# Patient Record
Sex: Female | Born: 1945 | Race: White | Hispanic: No | Marital: Married | Smoking: Never smoker
Health system: Southern US, Community
[De-identification: ages and names within clinical notes are randomized; demographics above are authoritative.]

## PROBLEM LIST (undated history)

## (undated) DIAGNOSIS — F32A Depression, unspecified: Secondary | ICD-10-CM

## (undated) DIAGNOSIS — R1013 Epigastric pain: Secondary | ICD-10-CM

## (undated) DIAGNOSIS — F419 Anxiety disorder, unspecified: Secondary | ICD-10-CM

## (undated) DIAGNOSIS — R45851 Suicidal ideations: Secondary | ICD-10-CM

## (undated) DIAGNOSIS — K589 Irritable bowel syndrome without diarrhea: Secondary | ICD-10-CM

## (undated) DIAGNOSIS — R4189 Other symptoms and signs involving cognitive functions and awareness: Secondary | ICD-10-CM

## (undated) DIAGNOSIS — M5136 Other intervertebral disc degeneration, lumbar region: Secondary | ICD-10-CM

## (undated) DIAGNOSIS — I639 Cerebral infarction, unspecified: Secondary | ICD-10-CM

## (undated) DIAGNOSIS — G2581 Restless legs syndrome: Secondary | ICD-10-CM

## (undated) DIAGNOSIS — I471 Supraventricular tachycardia, unspecified: Secondary | ICD-10-CM

## (undated) DIAGNOSIS — M8580 Other specified disorders of bone density and structure, unspecified site: Secondary | ICD-10-CM

## (undated) DIAGNOSIS — F429 Obsessive-compulsive disorder, unspecified: Secondary | ICD-10-CM

## (undated) DIAGNOSIS — K760 Fatty (change of) liver, not elsewhere classified: Secondary | ICD-10-CM

## (undated) DIAGNOSIS — K5790 Diverticulosis of intestine, part unspecified, without perforation or abscess without bleeding: Secondary | ICD-10-CM

## (undated) DIAGNOSIS — K3189 Other diseases of stomach and duodenum: Secondary | ICD-10-CM

## (undated) DIAGNOSIS — K635 Polyp of colon: Secondary | ICD-10-CM

## (undated) DIAGNOSIS — K219 Gastro-esophageal reflux disease without esophagitis: Secondary | ICD-10-CM

## (undated) DIAGNOSIS — M51369 Other intervertebral disc degeneration, lumbar region without mention of lumbar back pain or lower extremity pain: Secondary | ICD-10-CM

## (undated) DIAGNOSIS — I493 Ventricular premature depolarization: Secondary | ICD-10-CM

## (undated) DIAGNOSIS — N281 Cyst of kidney, acquired: Secondary | ICD-10-CM

## (undated) DIAGNOSIS — Z8619 Personal history of other infectious and parasitic diseases: Secondary | ICD-10-CM

## (undated) DIAGNOSIS — F329 Major depressive disorder, single episode, unspecified: Secondary | ICD-10-CM

## (undated) DIAGNOSIS — I10 Essential (primary) hypertension: Secondary | ICD-10-CM

## (undated) DIAGNOSIS — Z9889 Other specified postprocedural states: Secondary | ICD-10-CM

## (undated) DIAGNOSIS — M1990 Unspecified osteoarthritis, unspecified site: Secondary | ICD-10-CM

## (undated) DIAGNOSIS — R110 Nausea: Secondary | ICD-10-CM

## (undated) DIAGNOSIS — R7303 Prediabetes: Secondary | ICD-10-CM

## (undated) HISTORY — PX: OOPHORECTOMY: SHX86

## (undated) HISTORY — PX: LAPAROSCOPIC BILATERAL SALPINGO OOPHERECTOMY: SHX5890

## (undated) HISTORY — PX: COLONOSCOPY WITH ESOPHAGOGASTRODUODENOSCOPY (EGD): SHX5779

## (undated) HISTORY — PX: KNEE ARTHROSCOPY: SUR90

## (undated) HISTORY — PX: OTHER SURGICAL HISTORY: SHX169

## (undated) HISTORY — PX: FOOT SURGERY: SHX648

## (undated) HISTORY — PX: ABDOMINAL HYSTERECTOMY: SHX81

---

## 2000-06-04 HISTORY — PX: BREAST SURGERY: SHX581

## 2000-06-04 HISTORY — PX: BREAST EXCISIONAL BIOPSY: SUR124

## 2003-11-26 ENCOUNTER — Other Ambulatory Visit: Payer: Self-pay

## 2004-02-01 ENCOUNTER — Other Ambulatory Visit: Payer: Self-pay

## 2004-03-06 ENCOUNTER — Ambulatory Visit: Payer: Self-pay | Admitting: Anesthesiology

## 2004-07-26 ENCOUNTER — Ambulatory Visit: Payer: Self-pay | Admitting: Unknown Physician Specialty

## 2004-08-01 ENCOUNTER — Ambulatory Visit: Payer: Self-pay | Admitting: Unknown Physician Specialty

## 2004-09-19 ENCOUNTER — Emergency Department: Payer: Self-pay | Admitting: Emergency Medicine

## 2005-02-12 ENCOUNTER — Ambulatory Visit: Payer: Self-pay | Admitting: Obstetrics and Gynecology

## 2005-02-19 ENCOUNTER — Ambulatory Visit: Payer: Self-pay | Admitting: Obstetrics and Gynecology

## 2005-03-07 ENCOUNTER — Ambulatory Visit: Payer: Self-pay | Admitting: Unknown Physician Specialty

## 2005-04-08 ENCOUNTER — Emergency Department: Payer: Self-pay | Admitting: General Practice

## 2005-06-01 ENCOUNTER — Ambulatory Visit: Payer: Self-pay | Admitting: Podiatry

## 2005-11-02 ENCOUNTER — Other Ambulatory Visit: Payer: Self-pay

## 2005-11-02 ENCOUNTER — Ambulatory Visit: Payer: Self-pay | Admitting: Unknown Physician Specialty

## 2005-11-07 ENCOUNTER — Ambulatory Visit: Payer: Self-pay | Admitting: Unknown Physician Specialty

## 2006-02-14 ENCOUNTER — Ambulatory Visit: Payer: Self-pay | Admitting: Obstetrics and Gynecology

## 2006-03-04 ENCOUNTER — Ambulatory Visit: Payer: Self-pay | Admitting: Podiatry

## 2006-06-04 HISTORY — PX: KNEE ARTHROSCOPY: SUR90

## 2006-08-30 ENCOUNTER — Emergency Department: Payer: Self-pay | Admitting: Unknown Physician Specialty

## 2006-09-02 ENCOUNTER — Inpatient Hospital Stay: Payer: Self-pay | Admitting: Unknown Physician Specialty

## 2007-02-10 ENCOUNTER — Ambulatory Visit: Payer: Self-pay | Admitting: Unknown Physician Specialty

## 2007-02-18 ENCOUNTER — Ambulatory Visit: Payer: Self-pay | Admitting: Obstetrics and Gynecology

## 2008-01-19 ENCOUNTER — Inpatient Hospital Stay: Payer: Self-pay | Admitting: Psychiatry

## 2008-02-20 ENCOUNTER — Ambulatory Visit: Payer: Self-pay | Admitting: Obstetrics and Gynecology

## 2008-05-05 ENCOUNTER — Ambulatory Visit: Payer: Self-pay | Admitting: Anesthesiology

## 2008-05-24 ENCOUNTER — Ambulatory Visit: Payer: Self-pay | Admitting: Anesthesiology

## 2008-06-07 ENCOUNTER — Ambulatory Visit: Payer: Self-pay | Admitting: Anesthesiology

## 2008-07-08 ENCOUNTER — Ambulatory Visit: Payer: Self-pay | Admitting: Anesthesiology

## 2009-03-15 ENCOUNTER — Ambulatory Visit: Payer: Self-pay | Admitting: Obstetrics and Gynecology

## 2009-06-04 HISTORY — PX: CHOLECYSTECTOMY: SHX55

## 2009-11-08 ENCOUNTER — Ambulatory Visit: Payer: Self-pay | Admitting: Unknown Physician Specialty

## 2009-11-22 ENCOUNTER — Ambulatory Visit: Payer: Self-pay | Admitting: Surgery

## 2009-11-25 ENCOUNTER — Ambulatory Visit: Payer: Self-pay | Admitting: Surgery

## 2010-03-17 ENCOUNTER — Ambulatory Visit: Payer: Self-pay | Admitting: Obstetrics and Gynecology

## 2010-10-16 ENCOUNTER — Emergency Department: Payer: Self-pay | Admitting: Emergency Medicine

## 2011-05-04 ENCOUNTER — Ambulatory Visit: Payer: Self-pay | Admitting: Obstetrics and Gynecology

## 2011-08-03 ENCOUNTER — Encounter: Payer: Self-pay | Admitting: Physician Assistant

## 2011-09-03 ENCOUNTER — Encounter: Payer: Self-pay | Admitting: Physician Assistant

## 2011-10-05 ENCOUNTER — Ambulatory Visit: Payer: Self-pay | Admitting: Family Medicine

## 2012-05-09 ENCOUNTER — Ambulatory Visit: Payer: Self-pay | Admitting: Obstetrics and Gynecology

## 2012-09-05 ENCOUNTER — Ambulatory Visit: Payer: Self-pay | Admitting: Unknown Physician Specialty

## 2012-09-26 ENCOUNTER — Ambulatory Visit: Payer: Self-pay | Admitting: Unknown Physician Specialty

## 2012-10-15 ENCOUNTER — Ambulatory Visit: Payer: Self-pay

## 2012-11-02 ENCOUNTER — Ambulatory Visit: Payer: Self-pay

## 2012-11-08 LAB — COMPREHENSIVE METABOLIC PANEL
Albumin: 3.9 g/dL (ref 3.4–5.0)
Bilirubin,Total: 0.3 mg/dL (ref 0.2–1.0)
Chloride: 106 mmol/L (ref 98–107)
Co2: 31 mmol/L (ref 21–32)
Creatinine: 0.8 mg/dL (ref 0.60–1.30)
Glucose: 96 mg/dL (ref 65–99)
Osmolality: 281 (ref 275–301)
Potassium: 4 mmol/L (ref 3.5–5.1)
SGOT(AST): 32 U/L (ref 15–37)
Sodium: 139 mmol/L (ref 136–145)
Total Protein: 8.3 g/dL — ABNORMAL HIGH (ref 6.4–8.2)

## 2012-11-08 LAB — URINALYSIS, COMPLETE
Bilirubin,UR: NEGATIVE
Ketone: NEGATIVE
Nitrite: NEGATIVE
Protein: NEGATIVE
RBC,UR: 1 /HPF (ref 0–5)
Specific Gravity: 1.006 (ref 1.003–1.030)
WBC UR: 1 /HPF (ref 0–5)

## 2012-11-08 LAB — CBC
HCT: 43.5 % (ref 35.0–47.0)
MCHC: 33.9 g/dL (ref 32.0–36.0)
MCV: 90 fL (ref 80–100)
RDW: 13.8 % (ref 11.5–14.5)
WBC: 11.6 10*3/uL — ABNORMAL HIGH (ref 3.6–11.0)

## 2012-11-08 LAB — ACETAMINOPHEN LEVEL: Acetaminophen: <2 ug/mL

## 2012-11-08 LAB — DRUG SCREEN, URINE
Amphetamines, Ur Screen: NEGATIVE (ref ?–1000)
Barbiturates, Ur Screen: NEGATIVE (ref ?–200)
Cannabinoid 50 Ng, Ur ~~LOC~~: NEGATIVE (ref ?–50)
Cocaine Metabolite,Ur ~~LOC~~: NEGATIVE (ref ?–300)
Tricyclic, Ur Screen: NEGATIVE (ref ?–1000)

## 2012-11-09 ENCOUNTER — Observation Stay: Payer: Self-pay

## 2012-11-09 LAB — AMMONIA: Ammonia, Plasma: 27 mcmol/L (ref 11–32)

## 2012-11-10 LAB — CBC WITH DIFFERENTIAL/PLATELET
Basophil %: 0.5 %
Lymphocyte #: 4.1 10*3/uL — ABNORMAL HIGH (ref 1.0–3.6)
Lymphocyte %: 47 %
MCHC: 34.3 g/dL (ref 32.0–36.0)
Monocyte %: 6.5 %
Neutrophil #: 3.7 10*3/uL (ref 1.4–6.5)
RBC: 3.99 10*6/uL (ref 3.80–5.20)
RDW: 14.4 % (ref 11.5–14.5)

## 2013-04-21 ENCOUNTER — Inpatient Hospital Stay: Payer: Self-pay | Admitting: Psychiatry

## 2013-04-21 LAB — URINALYSIS, COMPLETE
Glucose,UR: NEGATIVE mg/dL (ref 0–75)
Leukocyte Esterase: NEGATIVE
Ph: 5 (ref 4.5–8.0)
Protein: NEGATIVE
RBC,UR: 2 /HPF (ref 0–5)
Specific Gravity: 1.02 (ref 1.003–1.030)
Squamous Epithelial: <1
WBC UR: 1 /HPF (ref 0–5)

## 2013-04-21 LAB — COMPREHENSIVE METABOLIC PANEL
Albumin: 4 g/dL (ref 3.4–5.0)
Alkaline Phosphatase: 85 U/L (ref 50–136)
Anion Gap: 4 — ABNORMAL LOW (ref 7–16)
BUN: 23 mg/dL — ABNORMAL HIGH (ref 7–18)
Bilirubin,Total: 0.4 mg/dL (ref 0.2–1.0)
EGFR (African American): >60
Osmolality: 280 (ref 275–301)
SGOT(AST): 26 U/L (ref 15–37)
SGPT (ALT): 31 U/L (ref 12–78)
Sodium: 138 mmol/L (ref 136–145)
Total Protein: 8.2 g/dL (ref 6.4–8.2)

## 2013-04-21 LAB — DRUG SCREEN, URINE
Amphetamines, Ur Screen: NEGATIVE (ref ?–1000)
Cannabinoid 50 Ng, Ur ~~LOC~~: NEGATIVE (ref ?–50)
MDMA (Ecstasy)Ur Screen: POSITIVE (ref ?–500)
Phencyclidine (PCP) Ur S: NEGATIVE (ref ?–25)

## 2013-04-21 LAB — SALICYLATE LEVEL: Salicylates, Serum: <1.7 mg/dL

## 2013-04-21 LAB — ACETAMINOPHEN LEVEL: Acetaminophen: <2 ug/mL

## 2013-04-21 LAB — ETHANOL: Ethanol: <3 mg/dL

## 2013-04-21 LAB — CBC
HGB: 13.8 g/dL (ref 12.0–16.0)
MCH: 29.6 pg (ref 26.0–34.0)
MCHC: 33.6 g/dL (ref 32.0–36.0)
MCV: 88 fL (ref 80–100)
RBC: 4.66 10*6/uL (ref 3.80–5.20)
WBC: 8.9 10*3/uL (ref 3.6–11.0)

## 2013-05-19 ENCOUNTER — Ambulatory Visit: Payer: Self-pay | Admitting: Obstetrics and Gynecology

## 2013-07-10 ENCOUNTER — Ambulatory Visit: Payer: Self-pay | Admitting: Internal Medicine

## 2013-11-06 LAB — ETHANOL
Ethanol %: 0.137 % — ABNORMAL HIGH (ref 0.000–0.080)
Ethanol: 137 mg/dL

## 2013-11-06 LAB — CBC
HCT: 43.2 % (ref 35.0–47.0)
HGB: 14.3 g/dL (ref 12.0–16.0)
MCH: 29.6 pg (ref 26.0–34.0)
MCHC: 33.1 g/dL (ref 32.0–36.0)
MCV: 89 fL (ref 80–100)
Platelet: 253 10*3/uL (ref 150–440)
RBC: 4.83 10*6/uL (ref 3.80–5.20)
RDW: 14.7 % — AB (ref 11.5–14.5)
WBC: 8.4 10*3/uL (ref 3.6–11.0)

## 2013-11-06 LAB — COMPREHENSIVE METABOLIC PANEL
ANION GAP: 7 (ref 7–16)
Albumin: 3.7 g/dL (ref 3.4–5.0)
Alkaline Phosphatase: 81 U/L
BUN: 21 mg/dL — AB (ref 7–18)
Bilirubin,Total: 0.2 mg/dL (ref 0.2–1.0)
CALCIUM: 9.2 mg/dL (ref 8.5–10.1)
CREATININE: 0.83 mg/dL (ref 0.60–1.30)
Chloride: 111 mmol/L — ABNORMAL HIGH (ref 98–107)
Co2: 23 mmol/L (ref 21–32)
EGFR (Non-African Amer.): >60
Glucose: 122 mg/dL — ABNORMAL HIGH (ref 65–99)
Osmolality: 286 (ref 275–301)
Potassium: 3.8 mmol/L (ref 3.5–5.1)
SGOT(AST): 96 U/L — ABNORMAL HIGH (ref 15–37)
SGPT (ALT): 103 U/L — ABNORMAL HIGH (ref 12–78)
SODIUM: 141 mmol/L (ref 136–145)
Total Protein: 8.1 g/dL (ref 6.4–8.2)

## 2013-11-07 ENCOUNTER — Inpatient Hospital Stay: Payer: Self-pay | Admitting: Psychiatry

## 2013-11-07 LAB — URINALYSIS, COMPLETE
BILIRUBIN, UR: NEGATIVE
Bacteria: NONE SEEN
GLUCOSE, UR: NEGATIVE mg/dL (ref 0–75)
Leukocyte Esterase: NEGATIVE
NITRITE: NEGATIVE
PROTEIN: NEGATIVE
Ph: 5 (ref 4.5–8.0)
Specific Gravity: 1.016 (ref 1.003–1.030)
Squamous Epithelial: NONE SEEN

## 2013-11-07 LAB — DRUG SCREEN, URINE

## 2013-11-07 LAB — SALICYLATE LEVEL: Salicylates, Serum: <1.7 mg/dL

## 2013-11-07 LAB — ACETAMINOPHEN LEVEL: Acetaminophen: <2 ug/mL

## 2013-12-11 DIAGNOSIS — E782 Mixed hyperlipidemia: Secondary | ICD-10-CM | POA: Insufficient documentation

## 2014-04-01 DIAGNOSIS — R1011 Right upper quadrant pain: Secondary | ICD-10-CM | POA: Insufficient documentation

## 2014-04-01 DIAGNOSIS — G8929 Other chronic pain: Secondary | ICD-10-CM | POA: Insufficient documentation

## 2014-04-09 ENCOUNTER — Ambulatory Visit: Payer: Self-pay | Admitting: Unknown Physician Specialty

## 2014-05-20 ENCOUNTER — Ambulatory Visit: Payer: Self-pay | Admitting: Obstetrics and Gynecology

## 2014-09-24 NOTE — H&P (Signed)
PATIENT NAME:  Joanna Mendoza, Joanna Mendoza MR#:  [id number] DATE OF BIRTH:  [birth year]  DATE OF ADMISSION:  11/09/2012  REFERRING PHYSICIAN:  Dr. Pollie Friar  PRIMARY CARE PHYSICIAN: Dr. Ramonita Lab, started seeing him recently; used to follow in the past with Dr. Jeananne Rama.   CHIEF COMPLAINTS: Lethargy, generalized weakness.   HISTORY OF PRESENT ILLNESS: This is a 69 year old female with significant past medical history of chronic anxiety and major depressive disorder with multiple previous hospitalizations for that, chronic lower back pain, who presents for above-mentioned complaints. The patient reports she woke up this a.m. where she reported she had fatigue. She did not feel well and she was tired, lethargic. There was some documentation of altered mental status and confusion, son who was at the bedside, were discussed with him about her confusion, altered mental status, where he denied that, reports that her main complaint has been mainly lethargy and fatigue and having no energy. In the ED, the patient had work-up, chest x-ray did not show any infiltrate. Urinalysis was negative. TSH was within normal limits. Her blood work did not show any significant abnormalities. She had CT brain done without contrast which did not show any acute findings. The patient's EKG did show normal sinus rhythm at 73 beats per minute without any significant changes. The patient denies any headache, lightheadedness, dizziness, any neck pain. Complains of chronic lower back pain. Denies any nuchal rigidity, fever or chills. Hospitalist service was requested to admit the patient for observation. The patient denies any focal deficits or tingling or numbness or weakness.  The patient reports she has been following up regularly with her psychiatric service. She had recently her Wellbutrin lowered to 100 mg oral daily, as well as she had her Topamax discontinued.   PAST MEDICAL HISTORY: 1.  Chronic anxiety and depression with previous  hospitalization.  2.  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  3.  Carpal tunnel syndrome.  4.  History of large tubular adenomatous duodenal mass in 2005.  5.  History of tubular adenomatous descending colon polyp in 2005.  6.  History of stress incontinence.  7.  History of slight stroke at age 84.   PAST SURGICAL HISTORY: 1.  Hysterectomy at age of 57, noncancerous.  2.  Benign left breast mass removed.  3.  Knee arthroscopy in 2007.  4.  Foot surgery in 2006.   ALLERGIES: 1.  CITALOPRAM. 2.  COGENTIN. 3.  FLAGYL. 4.  CODEINE.  5.  TAPE.   HOME MEDICATIONS: 1.  Diazepam 10 mg oral 3 times a day as needed.  2.  Effexor 100 mg oral daily.  3.  Ranitidine 150 mg oral 2 times a day.  4.  Refresh ophthalmic solution in each eye 2 times a day.  5.  Bupropion 75 mg oral daily.   SOCIAL HISTORY: The patient lives at home with her husband. No tobacco, no alcohol abuse.   FAMILY HISTORY: Significant for colon cancer in her father, sister had leukemia.   REVIEW OF SYSTEMS:   CONSTITUTIONAL:  The patient denies fever. She also complains of generalized weakness and fatigue, had decreased appetite.   EYES: Denies blurry vision, double vision, pain, inflammation or glaucoma.  EAR, NOSE, THROAT: Denies tinnitus, ear pain, hearing loss, epistaxis or discharge. RESPIRATORY: Denies cough, wheezing, hemoptysis, COPD.   CARDIOVASCULAR: Denies chest pain, orthopnea, edema, arrhythmia. GASTROINTESTINAL: Denies nausea, vomiting, diarrhea, abdominal pain, hematemesis, jaundice, constipation.  GENITOURINARY: Denies dysuria, hematuria, renal colic.  ENDOCRINE: Denies polyuria,  polydipsia, heat or cold intolerance.  HEMATOLOGY: Denies anemia, easy bruising, bleeding diathesis.  INTEGUMENTARY: Denies acne, rash or skin lesions.  MUSCULOSKELETAL: Complains of chronic lower back pain. Denies any cramps or gout.  NEUROLOGICAL: Denies any seizures, memory loss, headache, dementia, ataxia,  vertigo, dysarthria, epilepsy, numbness.  PSYCHIATRIC: Has history of anxiety, history of insomnia, history of bipolar disorder and depression.   DICTATION ENDS HERE.    ____________________________ Albertine Patricia, MD dse:nts D: 11/09/2012 01:56:34 ET T: 11/09/2012 02:06:20 ET JOB#: [id number]  cc: Albertine Patricia, MD, <Dictator> Tracia Lacomb Graciela Husbands MD ELECTRONICALLY SIGNED 11/09/2012 6:43

## 2014-09-24 NOTE — Discharge Summary (Signed)
PATIENT NAME:  Joanna Mendoza, Joanna Mendoza MR#:  [id number] DATE OF BIRTH:  December 09, 1945  DATE OF ADMISSION:  11/09/2012 DATE OF DISCHARGE:  11/10/2012  PRIMARY DIAGNOSES:  1.  Altered mental status.  2.  Depression.  HISTORY OF PRESENT ILLNESS: Please see admission history and physical. Briefly, this patient was admitted with some confusion and altered mental status. She had a negative head CT,  negative work-up including CBC, C-MET, TSH, folate, B12 and a chest x-ray. She was seen by psychiatry who recommended continuing her on her current medications, which had been adjusted by her primary psychiatrist. Apparently there is some history of fatty liver and an ammonia level was checked, which was negative. The patient will be discharged home on her outpatient psychiatric medicines and follow up with her primary psychiatrist within 1 to 2 weeks. She will follow up with Dr. Caryl Comes in clinic in 1 to 2 weeks.   TIME SPENT: 35 minutes. ____________________________ Cheral Marker. Ola Spurr, MD dpf:sb D: 11/10/2012 08:24:41 ET T: 11/10/2012 08:54:00 ET JOB#: [id number]  cc: Cheral Marker. Ola Spurr, MD, <Dictator> Maritza Goldsborough Ola Spurr MD ELECTRONICALLY SIGNED 11/19/2012 17:27

## 2014-09-24 NOTE — Discharge Summary (Signed)
PATIENT NAME:  Joanna Mendoza, Joanna Mendoza MR#:  [id number] DATE OF BIRTH:  [birth year]  DATE OF ADMISSION:  11/09/2012 DATE OF DISCHARGE:  11/11/2012  PRIMARY DIAGNOSES: 1.  Altered mental status.  2.  Depression.  3.  Dizziness.   HISTORY OF PRESENT ILLNESS: Please see prior discharge summary dictated. Briefly on the day of discharge, the patient developed some dizziness on standing. She was placed on telemetry and worked up for dizziness and possible transient ischemic attack. She had testing with carotid Dopplers, which were negative for any significant disease as well as for echocardiogram. Echo showed a normal ejection fraction 60% to 65%, some mild mitral valve regurgitation and mild tricuspid regurgitation. Telemetry was negative.   By June 10, the patient felt well and had no further episodes of dizziness and requested discharge home.   DISCHARGE DIET: Low sodium, regular consistency.   DISCHARGE FOLLOW-UP: The patient will follow up with Dr. Ramonita Lab in 1 to 2 weeks   ____________________________ Cheral Marker. Ola Spurr, MD dpf:cc D: 11/15/2012 81:82:99 ET T: 11/16/2012 18:33:13 ET JOB#: [id number]  cc: Cheral Marker. Ola Spurr, MD, <Dictator> Kiron Osmun Ola Spurr MD ELECTRONICALLY SIGNED 11/19/2012 17:27

## 2014-09-24 NOTE — H&P (Signed)
PATIENT NAME:  Joanna Mendoza, Joanna Mendoza MR#:  [id number] DATE OF BIRTH:  Sep 30, 1945  DATE OF ADMISSION:  11/09/2012  Addendum.  This is a  continuation.    PHYSICAL EXAMINATION: VITAL SIGNS:  Temperature 98.4, pulse 64, respiratory rate 18, blood pressure 107/65, saturating 95% on room air.  GENERAL:  Well-nourished female who looks comfortable in bed in no apparent distress.  HEENT:  Head atraumatic, normocephalic.  Pupils are equal and reactive to light.  Pink conjunctivae.  Anicteric sclerae.  Moist oral mucosa.  NECK:  Supple.  No thyromegaly.  No JVD.  CHEST:  Good air entry bilaterally.  No wheezing, rales, rhonchi.  CARDIOVASCULAR:  S1, S2 heard.  No rubs, murmurs, gallops.  ABDOMEN:  Soft, nontender, nondistended.  Bowel sounds present.  EXTREMITIES:  No edema.  No clubbing.  No cyanosis.  PSYCHIATRIC:  The patient has a flat affect, appears to be in a depressed mood, awake, alert x 3.  NEUROLOGIC:  Cranial nerves grossly intact.  Motor strength 5 out of 5.  No nuchal rigidity.  No sensory deficits and normal muscle tone.  Normal reflexes.  SKIN:  Normal skin turgor.  Warm and dry.  No rash.   PERTINENT LABORATORY DATA:  Glucose 96, BUN 23, creatinine 0.8, sodium 139, potassium 4, chloride 106, CO2 31.  TSH 1.88.  Urine drugs positive for MDMA and for benzodiazepines.  White blood cell 11.6, hemoglobin 14.7, hematocrit 43.5, platelets 312.  Urinalysis is negative for leukocyte esterase and nitrite.  Salicylates less than 1.7.  Tylenol less than 2.  PH 7.37.  EKG showing normal sinus rhythm without significant ST or T wave changes.  A chest x-ray does not show any opacity or infiltrate.   ASSESSMENT AND PLAN:   1.  Lethargy, generalized weakness, the patient's CT brain has no acute intracranial process, as well her physical exam is benign, has no focal deficits, we will have the patient on gentle hydration, she will be admitted for observation, and we will check folic acid and D63 level.  Her TSH  is within normal limits, the patient is known to have history of major depressive disorder, at this point I think most of her symptoms are related to psychiatric origin, especially with her recent changes in her psychiatric meds, so we will admit the patient for observation.  We will consult psychiatry service in a.m., the patient does not have any meningeal sign, afebrile, no leukocytosis, has normal muscle tonicity and we will resume her back on her home medications.  2.  Deep vein thrombosis prophylaxis.  SubQ heparin.  3.  CODE STATUS:  FULL CODE.   Total time spent on admission and patient care 45 minutes.    ____________________________ Albertine Patricia, MD dse:ea D: 11/09/2012 02:34:39 ET T: 11/09/2012 02:44:34 ET JOB#: [id number]  cc: Albertine Patricia, MD, <Dictator> Garl Speigner Graciela Husbands MD ELECTRONICALLY SIGNED 11/09/2012 6:43
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
## 2014-09-25 NOTE — H&P (Signed)
PATIENT NAME:  Joanna Mendoza, Joanna Mendoza MR#:  [id number] DATE OF BIRTH:  [birth year]  DATE OF ADMISSION:  11/07/2013  ADMITTING PHYSICIAN:  Dr. Camie Patience  ATTENDING PHYSICIAN: Dr. Orson Slick  IDENTIFYING DATA: [name] is a 69 year old female with history of depression, mood instability, and alcoholism.   CHIEF COMPLAINT: "I cannot take it anymore."   HISTORY OF PRESENT ILLNESS: [name] has been doing well lately. Actually she discontinued all psychotropic medication, as she considered herself very stable. Three months ago, she and her husband were approached by her stepson, who is 75 years old and used to live in [place]. After his girlfriend passed away, the stepson asked if he could come home. The patient, and especially her husband, was reluctant to agree, as there is a long history of substance abuse and family conflict. The patient, however, insisted that they accept her stepson home. She regrets her decision. There is continuous chaos, conflict, and arguments at home. Her husband does not want to get involved. She has been increasingly unhappy with her stepson's behavior, drinking, partying, leaving dirty dishes, clothes, and cigarettes all over the house, wasting food, not contributing to the household in any meaningful way. She relapsed on alcohol, and for the past few days has been drinking 1-1/2 bottles of wine. She became frightened and came to the hospital, as she certainly does not want to get sick from this relationship. She denies any symptoms of depression, anxiety, or psychosis. She is just frustrated, out of her mind. She denies, other than this brief alcohol relapse, substance use.   PAST PSYCHIATRIC HISTORY: She has been hospitalized multiple times in the psychiatric unit for depression and anxiety. Her diagnosis includes depression, bipolar and personality disorder, with borderline histrionic traits. There is no history of self-injurious behavior, but there is a  history of excessive drinking. She has been tried on multiple medications in the past, including Wellbutrin, Valium, but discontinued Wellbutrin about a year ago. She was also off Valium, but asked Dr. Caryl Comes, her primary provider, to prescribe Valium lately, as the story with the stepson is developing.   FAMILY PSYCHIATRIC HISTORY: Brother with depression, and mother with panic disorder and anxiety.   PAST MEDICAL HISTORY: GERD.   ALLERGIES: AMBIEN, CELEXA, CODEINE, COGENTIN, DOXEPIN, FLAGYL, LORAZEPAM, PROGESTERONE and TAPE.    MEDICATIONS ON ADMISSION: Zantac 150 mg twice daily, diazepam 5 mg 3 times daily, aspirin 81 mg daily, estradiol 0.5 mg daily.   SOCIAL HISTORY: She is married. Has been living with her husband in [place]. They have a house guest now. The stepson applied for disability, and reportedly, due to liver failure he suffers, he is going to be approved, and soon the patient hopes that he will find a place of his own while he has resources.   REVIEW OF SYSTEMS:    CONSTITUTIONAL: No fevers or chills. No weight changes.  EYES: No double or blurred vision.  ENT: No hearing loss.  RESPIRATORY: No shortness of breath or cough.  CARDIOVASCULAR: No chest pain or orthopnea.  GASTROINTESTINAL: No abdominal pain, nausea, vomiting, or diarrhea.  GENITOURINARY: No incontinence or frequency.  ENDOCRINE: No heat or cold intolerance.  LYMPHATIC: No anemia or easy bruising.  INTEGUMENTARY: No acne or rash.  MUSCULOSKELETAL: No muscle or joint pain.  NEUROLOGIC: No tingling or weakness.  PSYCHIATRIC: See history of present illness for details.   PHYSICAL EXAMINATION: VITAL SIGNS: Blood pressure 114/73, pulse 93, respirations 20, temperature 98.3.  GENERAL: This is a slender, middle-aged female  in no acute distress.  HEENT: The pupils are equal, round, and reactive to light. Sclerae anicteric.  NECK: Supple. No thyromegaly.  LUNGS: Clear to auscultation. No dullness to percussion.   HEART: Regular rhythm and rate. No murmurs, rubs, or gallops.  ABDOMEN: Soft, nontender, nondistended. Positive bowel sounds.  MUSCULOSKELETAL: Normal muscle strength in all extremities.  SKIN: No rashes or bruises.  LYMPHATIC: No cervical adenopathy.  NEUROLOGIC: Cranial nerves II through XII are intact.   LABORATORY DATA: Chemistries are within normal limits, except for glucose of 122, BUN of 21. Blood alcohol level 0.137. LFTs within normal limits, except for AST of 96, ALT of 103. Urine tox screen positive for benzodiazepines. CBC within normal limits. Urinalysis is not suggestive of urinary tract infection. Serum acetaminophen and salicylates are low.   MENTAL STATUS EXAMINATION: The patient is alert and oriented to person, place, time and situation. She is pleasant, polite and cooperative. She is well-groomed and casually dressed. She maintains good eye contact. Her speech is of normal rhythm, rate and volume. Mood is fine, with full affect. Thought process is logical and goal-oriented. Thought content: She denies suicidal or homicidal ideation. There are no delusions or paranoia. There are no auditory or visual hallucinations. Her cognition is grossly intact. Registration, recall, short and long-term memory are intact. She is of normal intelligence and fund of knowledge. Her insight and judgment are fair.   SUICIDE RISK ASSESSMENT: This is a patient with a long history of depression, anxiety, mood instability, who came to the hospital when exasperated with her husband and stepson. She has much better insight into her problems. She uses her best coping skills. At the time of discharge, she is no longer suicidal. She is ready to return to home to handle the situation.   DIAGNOSES:  AXIS I:  Mood disorder, not otherwise specified, alcohol dependence.  AXIS II: Borderline personality disorder.  AXIS III: Gastroesophageal reflux disease.  AXIS IV: Mental illness, substance abuse, family  conflict.  AXIS V: GAF 55.   PLAN:  The patient was admitted to [facility] for safety, stabilization, and medication management. She was initially placed on suicide precautions, and was closely monitored for any unsafe behaviors. She underwent full psychiatric and risk assessment. She received pharmacotherapy, individual and group psychotherapy, substance abuse counseling, and support from therapeutic milieu.   1.  Mood:  The patient is not interested in restarting an antidepressant. She is perfectly happy with the Valium prescribed by Dr. Caryl Comes. She wants to continue.  2.  Substance abuse: She relapsed on alcohol, but believes that she can handle it. She did not require detox. She refuses residential substance abuse treatment.   3.  Medical:  Would continue Prilosec for GERD.   4.  The patient does not want to go to see a psychiatrist, and she wants to follow up with Dr. Caryl Comes, her primary attending. We provided her with information about [facility], medication management, and substance abuse clinic in the community.    ____________________________ Wardell Honour Bary Leriche, MD jbp:mr D: 11/09/2013 17:10:00 ET T: 11/09/2013 19:38:22 ET JOB#: [id number]  cc: Jolanta B. Bary Leriche, MD, <Dictator> Clovis Fredrickson MD ELECTRONICALLY SIGNED 11/11/2013 6:56

## 2014-10-12 NOTE — H&P (Signed)
PATIENT NAMEMarland Kitchen Mendoza, AKRIDGE [id number] OF BIRTH: 1945/07/02 OF ADMISSION: 04/21/2013 PHYSICIAN: Emergency Room MDPHYSICIAN: Orson Slick, MD DATA: Joanna Mendoza is a 69 year old female with a history of depression.   COMPLAINT: "I don?t care anymore."  OF PRESENT ILLNESS: Joanna Mendoza has a long history of depression with multiple medication trials and hospitalizations. She feels that the combination of Effexor and Wellbutrin stopped working and she has been increasingly depressed. She was hospitalized on the medical floor in June of 2014 for prostration. She reports poor sleep, decreased appetite with weight loss, anhedonia, feeling of hopelessness, helplessness and guilt, poor memory and concentration, extremely low energy, social isolation, crying spells, and heightened anxiety. Lately she started having suicidal thoughts with a plan to overdose. She is a patient of Dr. Annitta Jersey for medication management and Dr. Marjory Lies for therapy.  She reports good compliance with medications and underscores how difficult it is to treat her due to multiple allergies, sensitivity to side effects and poor liver function from fatty liver. She denies psychotic symptoms or symptoms suggestive of bipolar mania. She denies alcohol, illicit drugs or prescription pill abuse.  PAST PSYCHIATRIC HISTORY: Multiple hospitalizations for depression and anxiety. She was diagnosed with depression, bipolar illness and personality disorder with borderline and histrionic traits. There is no history of self-injurious behavior. No history of excessive alcohol drinking or illicit substance use. She had been tried on numerous medications including Zoloft, Klonopin, Trazodone, Risperdal, Prozac, Remeron, Celexa, Anafranil, Clomipramine, Lexapro, Effexor, Topamax, Seroquel, Tofranil, Xanax, Wellbutrin, Adderall, Trileptal, Geodon, and Cymbalta. She appears to be exquisitely sensitive to side effects of medications. There was at least on suicide attempt  by medication overdose many years ago. There are no self injurious behaviors.   FAMILY HISTORY: A brother with depression and mother with panic disorder.   PAST MEDICAL HISTORY:  Gastroesophageal reflux disease.  History of fatty liver.   ALLERGIES: Ambien,Celexa, Codeine, Cogentin, Doxepine, Flagyl, Lorazepam, Progesteron and tape.   ADMISSION MEDICATIONS:  ASA 81 mg daily.  Wellbutrin XL 300 mg daily.  Effexor 100 mg daily.   Diazepam 10 mg three times a day.  Ranitidine 150 mg twice daily.  Estradiol 0.5 mg daily.   HISTORY: The patient is originally from [place]. She used to work as a guard. She is disabled from back problems and mental illness. She has been married to her current husband for fourteen years. He is very supportive but also controlling. She has three children who live in the area and multiple grandchildren.   REVIEW OF SYSTEMS:No fevers or chills. Positive for weight loss. No double or blurred vision. No hearing loss. No shortness of breath or cough. No chest pain or orthopnea. No abdominal pain, nausea, vomiting or diarrhea. No incontinence or frequency. No heat or cold intolerance. No anemia or easy bruising. No acne or rash. No muscle or joint pain. No tingling or weakness. See history of present illness for details.   PHYSICAL EXAMINATION:SIGNS: Blood pressure 114/75, pulse 97, respirations 18, temperature 98. This is a slender female in no acute distress. The pupils are equal, round and reactive to light. Sclerae are anicteric. Supple. No thyromegaly. Clear to auscultation. No dullness to percussion. Regular rhythm and rate. No murmurs, rubs or gallops. Soft, nontender, nondistended. Positive bowel sounds. Normal muscle strength in all extremities. No rashes or bruises. No cervical adenopathy. Cranial nerves II through XII are intact. Normal gait.    LABORATORY, DIAGNOSTIC, AND RADIOLOGICAL DATA: Chemistries within normal limits except BUN 23. LFTs are within normal limits.  Urine tox screen positive for benzodiazepines and MDMA. CBC within normal limits. Urinalysis is not suggestive of UTI.   STATUS EXAM ON ADMISSION ON ADMISSION: The patient is alert and oriented to person, place, time and situation. She is pleasant, polite and cooperative. She maintains good eye contact. Her speech is soft. Mood is "sad" with blunted affect. Thought process is logical and goal oriented. Thought content: She denied any suicidal or homicidal ideations right now but has had thoughts of suicide with a plan for the past several weeks. She is able to contract for safety. There are no psychotic symptoms. Her cognition is grossly intact. She registers three out of three and recalls three out of three objects after five minutes. She could spell world forward and backward and interpreted proverbs in an abstract way. She could name current president. Her insight and judgment were fair.  RISK ASSESSMENT ON ADMISSION: This is a patient with history of depression, anxiety, mood instability, impulsivity, and suicide attempt who became increasingly depressed and suicidal in spite of good treatment compliance.  DIAGNOSES: I:  Major depressive disorder, recurrent severe. Panic disorder with agoraphobia.  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 III:  Gastroesophageal reflux disease, fatty liver. IV:  Mental illness, substance abuse, marital conflict. V:  GAF on admission 35. The patient was admitted to [facility] Unit for safety, stabilization, and medication adjustment. At no time during the current hospitalization was the patient suicidal or homicidal. She actively participated in her treatment and received pharmacotherapy, individual and group psychotherapy, substance abuse counseling, and support from therapeutic milieu.   Suicidal ideation: The patient is able to contract for safety.  Mood: We will continue her current regimen and contact Dr.  Annitta Jersey.   Medical: Will continue zantac.    Disposition: She will be discharged to home and follow up with Dr. Annitta Jersey.    Electronic Signatures: Orson Slick (MD)  (Signed on 19-Nov-14 05:49)  Authored  Last Updated: 19-Nov-14 05:49 by Orson Slick (MD)

## 2015-02-01 ENCOUNTER — Other Ambulatory Visit: Payer: Self-pay | Admitting: Physician Assistant

## 2015-02-01 DIAGNOSIS — M75101 Unspecified rotator cuff tear or rupture of right shoulder, not specified as traumatic: Secondary | ICD-10-CM

## 2015-02-04 ENCOUNTER — Ambulatory Visit
Admission: RE | Admit: 2015-02-04 | Discharge: 2015-02-04 | Disposition: A | Discharge status: Home / Self Care | Payer: PPO | Source: Ambulatory Visit | Attending: Physician Assistant | Admitting: Physician Assistant

## 2015-02-04 DIAGNOSIS — M19011 Primary osteoarthritis, right shoulder: Secondary | ICD-10-CM | POA: Insufficient documentation

## 2015-02-04 DIAGNOSIS — M75101 Unspecified rotator cuff tear or rupture of right shoulder, not specified as traumatic: Secondary | ICD-10-CM | POA: Insufficient documentation

## 2015-03-03 ENCOUNTER — Encounter
Admission: RE | Admit: 2015-03-03 | Discharge: 2015-03-03 | Disposition: A | Discharge status: Home / Self Care | Payer: PPO | Source: Ambulatory Visit | Attending: Orthopedic Surgery | Admitting: Orthopedic Surgery

## 2015-03-03 DIAGNOSIS — Z01818 Encounter for other preprocedural examination: Secondary | ICD-10-CM | POA: Insufficient documentation

## 2015-03-03 HISTORY — DX: Depression, unspecified: F32.A

## 2015-03-03 HISTORY — DX: Major depressive disorder, single episode, unspecified: F32.9

## 2015-03-03 HISTORY — DX: Gastro-esophageal reflux disease without esophagitis: K21.9

## 2015-03-03 HISTORY — DX: Anxiety disorder, unspecified: F41.9

## 2015-03-03 HISTORY — DX: Unspecified osteoarthritis, unspecified site: M19.90

## 2015-03-03 HISTORY — DX: Fatty (change of) liver, not elsewhere classified: K76.0

## 2015-03-03 HISTORY — DX: Irritable bowel syndrome, unspecified: K58.9

## 2015-03-03 LAB — CBC
HEMATOCRIT: 42.7 % (ref 35.0–47.0)
Hemoglobin: 14 g/dL (ref 12.0–16.0)
MCH: 29.8 pg (ref 26.0–34.0)
MCHC: 32.9 g/dL (ref 32.0–36.0)
MCV: 90.7 fL (ref 80.0–100.0)
PLATELETS: 252 10*3/uL (ref 150–440)
RBC: 4.71 MIL/uL (ref 3.80–5.20)
RDW: 14.6 % — AB (ref 11.5–14.5)
WBC: 9.1 10*3/uL (ref 3.6–11.0)

## 2015-03-03 NOTE — Patient Instructions (Signed)
  Your procedure is scheduled on: March 14, 2015 (Monday) Report to Day Surgery. [facility]) To find out your arrival time please call [phone] between 1PM - 3PM on March 11, 2015 (Friday).  Remember: Instructions that are not followed completely may result in serious medical risk, up to and including death, or upon the discretion of your surgeon and anesthesiologist your surgery may need to be rescheduled.    __x__ 1. Do not eat food or drink liquids after midnight. No gum chewing or hard candies.     ____ 2. No Alcohol for 24 hours before or after surgery.   ____ 3. Bring all medications with you on the day of surgery if instructed.    __x__ 4. Notify your doctor if there is any change in your medical condition     (cold, fever, infections).     Do not wear jewelry, make-up, hairpins, clips or nail polish.  Do not wear lotions, powders, or perfumes. You may wear deodorant.  Do not shave 48 hours prior to surgery. Men may shave face and neck.  Do not bring valuables to the hospital.    [facility] is not responsible for any belongings or valuables.               Contacts, dentures or bridgework may not be worn into surgery.  Leave your suitcase in the car. After surgery it may be brought to your room.  For patients admitted to the hospital, discharge time is determined by your                treatment team.   Patients discharged the day of surgery will not be allowed to drive home.   Please read over the following fact sheets that you were given:   Surgical Site Infection Prevention   ____ Take these medicines the morning of surgery with A SIP OF WATER:    1. Ranitidine  2. Magnesium  3.   4.  5.  6.  ____ Fleet Enema (as directed)   __x__ Use CHG Soap as directed  ____ Use inhalers on the day of surgery  ____ Stop metformin 2 days prior to surgery    ____ Take 1/2 of usual insulin dose the night before surgery and none on the morning of surgery.   ____  Stop Coumadin/Plavix/aspirin on   _x___ Stop Anti-inflammatories on (Tylenol ok to use for pain also if needed)   _x___ Stop supplements until after surgery.  (Stop Vitamin B, Chromium, Melatonin)  ____ Bring C-Pap to the hospital.

## 2015-03-14 ENCOUNTER — Ambulatory Visit
Admission: RE | Admit: 2015-03-14 | Discharge: 2015-03-14 | Disposition: A | Discharge status: Home / Self Care | Payer: PPO | Source: Ambulatory Visit | Attending: Orthopedic Surgery | Admitting: Orthopedic Surgery

## 2015-03-14 ENCOUNTER — Encounter
Admission: RE | Disposition: A | Discharge status: Home / Self Care | Payer: Self-pay | Source: Ambulatory Visit | Attending: Orthopedic Surgery

## 2015-03-14 ENCOUNTER — Ambulatory Visit: Payer: PPO | Admitting: Anesthesiology

## 2015-03-14 DIAGNOSIS — Z5309 Procedure and treatment not carried out because of other contraindication: Secondary | ICD-10-CM | POA: Insufficient documentation

## 2015-03-14 LAB — URINE DRUG SCREEN, QUALITATIVE (ARMC ONLY)
AMPHETAMINES, UR SCREEN: POSITIVE — AB
BARBITURATES, UR SCREEN: NOT DETECTED
BENZODIAZEPINE, UR SCRN: POSITIVE — AB
Cannabinoid 50 Ng, Ur ~~LOC~~: NOT DETECTED
Cocaine Metabolite,Ur ~~LOC~~: NOT DETECTED
MDMA (Ecstasy)Ur Screen: NOT DETECTED
METHADONE SCREEN, URINE: NOT DETECTED
OPIATE, UR SCREEN: NOT DETECTED
Phencyclidine (PCP) Ur S: NOT DETECTED
TRICYCLIC, UR SCREEN: NOT DETECTED

## 2015-03-14 SURGERY — SHOULDER ACROMIOPLASTY
Anesthesia: Choice | Laterality: Right

## 2015-03-14 MED ORDER — CEFAZOLIN SODIUM 1-5 GM-% IV SOLN
INTRAVENOUS | Status: AC
  Filled 2015-03-14: qty 50

## 2015-03-14 MED ORDER — CEFAZOLIN SODIUM 1-5 GM-% IV SOLN: 1.0000 g | Freq: Once | INTRAVENOUS | Status: DC

## 2015-03-14 MED ORDER — LACTATED RINGERS IV SOLN
INTRAVENOUS | Status: DC
  Administered 2015-03-14: 14:00:00 via INTRAVENOUS

## 2015-03-14 SURGICAL SUPPLY — 36 items
CANISTER SUCT 1200ML W/VALVE (MISCELLANEOUS) IMPLANT
CLOSURE WOUND 1/2 X4 (GAUZE/BANDAGES/DRESSINGS)
DRAPE INCISE IOBAN 66X45 STRL (DRAPES) IMPLANT
DRSG DERMACEA 8X12 NADH (GAUZE/BANDAGES/DRESSINGS) IMPLANT
DRSG TEGADERM 6X8 (GAUZE/BANDAGES/DRESSINGS) IMPLANT
DRSG TELFA 3X8 NADH (GAUZE/BANDAGES/DRESSINGS) IMPLANT
DURAPREP 26ML APPLICATOR (WOUND CARE) IMPLANT
ELECT CAUTERY BLADE 6.4 (BLADE) IMPLANT
GLOVE BIO SURGEON STRL SZ8 (GLOVE) IMPLANT
GLOVE BIOGEL M STRL SZ7.5 (GLOVE) IMPLANT
GLOVE INDICATOR 8.0 STRL GRN (GLOVE) IMPLANT
GOWN STRL REUS W/ TWL LRG LVL3 (GOWN DISPOSABLE) IMPLANT
GOWN STRL REUS W/TWL LRG LVL3 (GOWN DISPOSABLE)
GOWN WARM PT ADLT STD (MISCELLANEOUS) IMPLANT
KIT STABILIZATION SHOULDER (MISCELLANEOUS) IMPLANT
MASK FACE SPIDER DISP (MASK) IMPLANT
NDL SAFETY 18GX1.5 (NEEDLE) IMPLANT
NEEDLE FILTER BLUNT 18X 1/2SAF (NEEDLE)
NEEDLE FILTER BLUNT 18X1 1/2 (NEEDLE) IMPLANT
NEEDLE MAYO 6 CRC TAPER PT (NEEDLE) IMPLANT
NS IRRIG 500ML POUR BTL (IV SOLUTION) IMPLANT
PACK ARTHROSCOPY SHOULDER (MISCELLANEOUS) IMPLANT
PAD GROUND ADULT SPLIT (MISCELLANEOUS) IMPLANT
PASSER SUT CAPTURE FIRST (SUTURE) IMPLANT
RASP SM TEAR CROSS CUT (RASP) IMPLANT
SLING ULTRA II LG (MISCELLANEOUS) IMPLANT
STRAP SAFETY BODY (MISCELLANEOUS) IMPLANT
STRIP CLOSURE SKIN 1/2X4 (GAUZE/BANDAGES/DRESSINGS) IMPLANT
SUT ETHIBOND 2-0  6-8 CP-2 (SUTURE)
SUT ETHIBOND 2-0 6-8 CP-2 (SUTURE) IMPLANT
SUT ETHIBOND NAB CT1 #1 30IN (SUTURE) IMPLANT
SUT VIC AB 0 CT2 27 (SUTURE) IMPLANT
SUT VIC AB 2-0 CT2 27 (SUTURE) IMPLANT
SUT VIC AB 4-0 FS2 27 (SUTURE) IMPLANT
SYRINGE 10CC LL (SYRINGE) IMPLANT
WRAP SHOULDER HOT/COLD PACK (SOFTGOODS) IMPLANT

## 2015-03-14 NOTE — OR Nursing (Signed)
Surgery canceled per Dr Anesthesia. IV discontinued. Patient dressed and discharged ambulatory to home.

## 2015-03-14 NOTE — Progress Notes (Signed)
UDS performed on arrival to [facility]. Some positive results (positive for amphetamines) came back and anesthesia (Dr. Kayleen Memos) and Dr. Marry Guan made aware. Both MDs spoke to patient and surgery has been cancelled.

## 2015-03-14 NOTE — Anesthesia Preprocedure Evaluation (Addendum)
Anesthesia Evaluation  Patient identified by MRN, date of birth, ID band Patient awake    Reviewed: Allergy & Precautions, NPO status , Patient's Chart, lab work & pertinent test results  Airway        Dental   Pulmonary neg pulmonary ROS,           Cardiovascular negative cardio ROS       Neuro/Psych Anxiety Depression    GI/Hepatic Neg liver ROS, GERD  Medicated and Controlled,IBS   Endo/Other  negative endocrine ROS  Renal/GU negative Renal ROS  negative genitourinary   Musculoskeletal  (+) Arthritis , Osteoarthritis,    Abdominal   Peds negative pediatric ROS (+)  Hematology negative hematology ROS (+)   Anesthesia Other Findings   Reproductive/Obstetrics                            Anesthesia Physical Anesthesia Plan  ASA: II  Anesthesia Plan: General   Post-op Pain Management:    Induction: Intravenous  Airway Management Planned: Oral ETT  Additional Equipment:   Intra-op Plan:   Post-operative Plan: Extubation in OR  Informed Consent: I have reviewed the patients History and Physical, chart, labs and discussed the procedure including the risks, benefits and alternatives for the proposed anesthesia with the patient or authorized representative who has indicated his/her understanding and acceptance.   Dental advisory given  Plan Discussed with: CRNA and Surgeon  Anesthesia Plan Comments: (Patient declines an interscalene block for post op pain control after full disclosure of risks and benefits.  Will do GOT)        Anesthesia Quick Evaluation

## 2015-03-23 ENCOUNTER — Other Ambulatory Visit: Payer: PPO

## 2015-03-23 ENCOUNTER — Encounter: Payer: Self-pay | Admitting: *Deleted

## 2015-03-23 NOTE — Patient Instructions (Signed)
  Your procedure is scheduled on: 04-06-15 Report to [place] To find out your arrival time please call [phone] between 1PM - 3PM on 04-05-15  Remember: Instructions that are not followed completely may result in serious medical risk, up to and including death, or upon the discretion of your surgeon and anesthesiologist your surgery may need to be rescheduled.    _X___ 1. Do not eat food or drink liquids after midnight. No gum chewing or hard candies.     _X___ 2. No Alcohol for 24 hours before or after surgery.   ____ 3. Bring all medications with you on the day of surgery if instructed.    ____ 4. Notify your doctor if there is any change in your medical condition     (cold, fever, infections).     Do not wear jewelry, make-up, hairpins, clips or nail polish.  Do not wear lotions, powders, or perfumes. You may wear deodorant.  Do not shave 48 hours prior to surgery. Men may shave face and neck.  Do not bring valuables to the hospital.    [facility] is not responsible for any belongings or valuables.               Contacts, dentures or bridgework may not be worn into surgery.  Leave your suitcase in the car. After surgery it may be brought to your room.  For patients admitted to the hospital, discharge time is determined by your  treatment team.   Patients discharged the day of surgery will not be allowed to drive home.   Please read over the following fact sheets that you were given:      _X___ Take these medicines the morning of surgery with A SIP OF WATER:    1. ZANTAC  2. ZOLOFT  3. MAGNESIUM  4.  5.  6.  ____ Fleet Enema (as directed)   ____ Use CHG Soap as directed  ____ Use inhalers on the day of surgery  ____ Stop metformin 2 days prior to surgery    ____ Take 1/2 of usual insulin dose the night before surgery and none on the morning of surgery.   ____ Stop Coumadin/Plavix/aspirin-N/A  ____ Stop Anti-inflammatories-NO  NSAIDS OR ASA PRODUCTS-TRAMADOL OK   _X___ Stop supplements until after surgery-STOP VITAMIN B 6, MELATONIN AND CHROMIUM 7 DAYS PRIOR   ____ Bring C-Pap to the hospital.

## 2015-04-04 MED ORDER — FENTANYL CITRATE (PF) 100 MCG/2ML IJ SOLN
INTRAMUSCULAR | Status: AC
  Filled 2015-04-04: qty 2

## 2015-04-04 MED ORDER — ORPHENADRINE CITRATE 30 MG/ML IJ SOLN
INTRAMUSCULAR | Status: AC
  Filled 2015-04-04: qty 2

## 2015-04-04 MED ORDER — TRIAMCINOLONE ACETONIDE 40 MG/ML IJ SUSP
INTRAMUSCULAR | Status: AC
  Filled 2015-04-04: qty 1

## 2015-04-04 MED ORDER — BUPIVACAINE HCL (PF) 0.25 % IJ SOLN
INTRAMUSCULAR | Status: AC
  Filled 2015-04-04: qty 30

## 2015-04-04 MED ORDER — MIDAZOLAM HCL 5 MG/5ML IJ SOLN
INTRAMUSCULAR | Status: AC
  Filled 2015-04-04: qty 45

## 2015-04-06 ENCOUNTER — Ambulatory Visit: Payer: PPO | Admitting: Anesthesiology

## 2015-04-06 ENCOUNTER — Encounter: Payer: Self-pay | Admitting: Orthopedic Surgery

## 2015-04-06 ENCOUNTER — Ambulatory Visit
Admission: RE | Admit: 2015-04-06 | Discharge: 2015-04-06 | Disposition: A | Discharge status: Home / Self Care | Payer: PPO | Source: Ambulatory Visit | Attending: Orthopedic Surgery | Admitting: Orthopedic Surgery

## 2015-04-06 ENCOUNTER — Encounter
Admission: RE | Disposition: A | Discharge status: Home / Self Care | Payer: Self-pay | Source: Ambulatory Visit | Attending: Orthopedic Surgery

## 2015-04-06 DIAGNOSIS — Z79899 Other long term (current) drug therapy: Secondary | ICD-10-CM | POA: Diagnosis not present

## 2015-04-06 DIAGNOSIS — Z818 Family history of other mental and behavioral disorders: Secondary | ICD-10-CM | POA: Diagnosis not present

## 2015-04-06 DIAGNOSIS — K219 Gastro-esophageal reflux disease without esophagitis: Secondary | ICD-10-CM | POA: Diagnosis not present

## 2015-04-06 DIAGNOSIS — Z8249 Family history of ischemic heart disease and other diseases of the circulatory system: Secondary | ICD-10-CM | POA: Diagnosis not present

## 2015-04-06 DIAGNOSIS — Z888 Allergy status to other drugs, medicaments and biological substances status: Secondary | ICD-10-CM | POA: Diagnosis not present

## 2015-04-06 DIAGNOSIS — M8580 Other specified disorders of bone density and structure, unspecified site: Secondary | ICD-10-CM | POA: Diagnosis not present

## 2015-04-06 DIAGNOSIS — Z8673 Personal history of transient ischemic attack (TIA), and cerebral infarction without residual deficits: Secondary | ICD-10-CM | POA: Insufficient documentation

## 2015-04-06 DIAGNOSIS — Z800 Family history of malignant neoplasm of digestive organs: Secondary | ICD-10-CM | POA: Insufficient documentation

## 2015-04-06 DIAGNOSIS — K589 Irritable bowel syndrome without diarrhea: Secondary | ICD-10-CM | POA: Insufficient documentation

## 2015-04-06 DIAGNOSIS — M75101 Unspecified rotator cuff tear or rupture of right shoulder, not specified as traumatic: Secondary | ICD-10-CM | POA: Diagnosis present

## 2015-04-06 DIAGNOSIS — F419 Anxiety disorder, unspecified: Secondary | ICD-10-CM | POA: Diagnosis not present

## 2015-04-06 DIAGNOSIS — K5730 Diverticulosis of large intestine without perforation or abscess without bleeding: Secondary | ICD-10-CM | POA: Insufficient documentation

## 2015-04-06 DIAGNOSIS — M5136 Other intervertebral disc degeneration, lumbar region: Secondary | ICD-10-CM | POA: Insufficient documentation

## 2015-04-06 DIAGNOSIS — Z833 Family history of diabetes mellitus: Secondary | ICD-10-CM | POA: Insufficient documentation

## 2015-04-06 DIAGNOSIS — M75121 Complete rotator cuff tear or rupture of right shoulder, not specified as traumatic: Secondary | ICD-10-CM | POA: Insufficient documentation

## 2015-04-06 DIAGNOSIS — F329 Major depressive disorder, single episode, unspecified: Secondary | ICD-10-CM | POA: Diagnosis not present

## 2015-04-06 DIAGNOSIS — Z806 Family history of leukemia: Secondary | ICD-10-CM | POA: Insufficient documentation

## 2015-04-06 DIAGNOSIS — Z86010 Personal history of colonic polyps: Secondary | ICD-10-CM | POA: Insufficient documentation

## 2015-04-06 DIAGNOSIS — E785 Hyperlipidemia, unspecified: Secondary | ICD-10-CM | POA: Diagnosis not present

## 2015-04-06 DIAGNOSIS — Z91048 Other nonmedicinal substance allergy status: Secondary | ICD-10-CM | POA: Diagnosis not present

## 2015-04-06 HISTORY — PX: SHOULDER ACROMIOPLASTY: SHX6093

## 2015-04-06 LAB — URINE DRUG SCREEN, QUALITATIVE (ARMC ONLY)
Amphetamines, Ur Screen: POSITIVE — AB
BARBITURATES, UR SCREEN: NOT DETECTED
BENZODIAZEPINE, UR SCRN: POSITIVE — AB
CANNABINOID 50 NG, UR ~~LOC~~: NOT DETECTED
Cocaine Metabolite,Ur ~~LOC~~: NOT DETECTED
MDMA (ECSTASY) UR SCREEN: NOT DETECTED
Methadone Scn, Ur: NOT DETECTED
Opiate, Ur Screen: NOT DETECTED
PHENCYCLIDINE (PCP) UR S: NOT DETECTED
Tricyclic, Ur Screen: NOT DETECTED

## 2015-04-06 SURGERY — SHOULDER ACROMIOPLASTY
Anesthesia: General | Site: Shoulder | Laterality: Right | Wound class: Clean

## 2015-04-06 MED ORDER — ROPIVACAINE HCL 2 MG/ML IJ SOLN
INTRAMUSCULAR | Status: AC
  Filled 2015-04-06: qty 20

## 2015-04-06 MED ORDER — DEXAMETHASONE SODIUM PHOSPHATE 10 MG/ML IJ SOLN
INTRAMUSCULAR | Status: DC | PRN
  Administered 2015-04-06: 5 mg via INTRAVENOUS

## 2015-04-06 MED ORDER — NEOMYCIN-POLYMYXIN B GU 40-200000 IR SOLN
Status: AC
  Filled 2015-04-06: qty 2

## 2015-04-06 MED ORDER — METOCLOPRAMIDE HCL 5 MG/ML IJ SOLN: 5.0000 mg | Freq: Three times a day (TID) | INTRAMUSCULAR | Status: DC | PRN

## 2015-04-06 MED ORDER — GLYCOPYRROLATE 0.2 MG/ML IJ SOLN
INTRAMUSCULAR | Status: DC | PRN
  Administered 2015-04-06: 0.3 mg via INTRAVENOUS

## 2015-04-06 MED ORDER — ACETAMINOPHEN 10 MG/ML IV SOLN
INTRAVENOUS | Status: DC | PRN
  Administered 2015-04-06: 1000 mg via INTRAVENOUS

## 2015-04-06 MED ORDER — ONDANSETRON HCL 4 MG/2ML IJ SOLN: 4.0000 mg | Freq: Once | INTRAMUSCULAR | Status: DC | PRN

## 2015-04-06 MED ORDER — NEOMYCIN-POLYMYXIN B GU 40-200000 IR SOLN
Status: DC | PRN
Start: 2015-04-06 — End: 2015-04-06
  Administered 2015-04-06: 2 mL

## 2015-04-06 MED ORDER — CHLORHEXIDINE GLUCONATE 4 % EX LIQD: Freq: Once | CUTANEOUS | Status: DC

## 2015-04-06 MED ORDER — SUCCINYLCHOLINE CHLORIDE 20 MG/ML IJ SOLN
INTRAMUSCULAR | Status: DC | PRN
  Administered 2015-04-06: 80 mg via INTRAVENOUS

## 2015-04-06 MED ORDER — EPHEDRINE SULFATE 50 MG/ML IJ SOLN
INTRAMUSCULAR | Status: DC | PRN
  Administered 2015-04-06 (×2): 10 mg via INTRAVENOUS

## 2015-04-06 MED ORDER — FENTANYL CITRATE (PF) 100 MCG/2ML IJ SOLN
INTRAMUSCULAR | Status: DC | PRN
  Administered 2015-04-06 (×2): 50 ug via INTRAVENOUS

## 2015-04-06 MED ORDER — MIDAZOLAM HCL 5 MG/5ML IJ SOLN
1.0000 mg | Freq: Once | INTRAMUSCULAR | Status: AC
  Administered 2015-04-06: 1 mg via INTRAVENOUS

## 2015-04-06 MED ORDER — ROPIVACAINE HCL 5 MG/ML IJ SOLN
INTRAMUSCULAR | Status: AC
  Filled 2015-04-06: qty 20

## 2015-04-06 MED ORDER — SODIUM CHLORIDE 0.9 % IV SOLN: INTRAVENOUS | Status: DC

## 2015-04-06 MED ORDER — LACTATED RINGERS IV SOLN
INTRAVENOUS | Status: DC
  Administered 2015-04-06 (×2): via INTRAVENOUS

## 2015-04-06 MED ORDER — NEOSTIGMINE METHYLSULFATE 10 MG/10ML IV SOLN
INTRAVENOUS | Status: DC | PRN
  Administered 2015-04-06: 2.5 mg via INTRAVENOUS

## 2015-04-06 MED ORDER — HYDROCODONE-ACETAMINOPHEN 5-325 MG PO TABS: 1.0000 | ORAL_TABLET | ORAL | Status: DC | PRN

## 2015-04-06 MED ORDER — ONDANSETRON HCL 4 MG PO TABS: 4.0000 mg | ORAL_TABLET | Freq: Four times a day (QID) | ORAL | Status: DC | PRN

## 2015-04-06 MED ORDER — ONDANSETRON HCL 4 MG/2ML IJ SOLN: 4.0000 mg | Freq: Four times a day (QID) | INTRAMUSCULAR | Status: DC | PRN

## 2015-04-06 MED ORDER — ACETAMINOPHEN 10 MG/ML IV SOLN
INTRAVENOUS | Status: AC
  Filled 2015-04-06: qty 100

## 2015-04-06 MED ORDER — BUPIVACAINE-EPINEPHRINE (PF) 0.25% -1:200000 IJ SOLN
INTRAMUSCULAR | Status: DC | PRN
  Administered 2015-04-06: 5 mL via PERINEURAL

## 2015-04-06 MED ORDER — CEFAZOLIN SODIUM 1-5 GM-% IV SOLN
INTRAVENOUS | Status: AC
  Administered 2015-04-06: 1 g via INTRAVENOUS
  Filled 2015-04-06: qty 50

## 2015-04-06 MED ORDER — ONDANSETRON HCL 4 MG/2ML IJ SOLN
INTRAMUSCULAR | Status: DC | PRN
  Administered 2015-04-06: 4 mg via INTRAVENOUS

## 2015-04-06 MED ORDER — LIDOCAINE HCL (CARDIAC) 20 MG/ML IV SOLN
INTRAVENOUS | Status: DC | PRN
Start: 2015-04-06 — End: 2015-04-06
  Administered 2015-04-06: 45 mg via INTRAVENOUS

## 2015-04-06 MED ORDER — PROPOFOL 10 MG/ML IV BOLUS
INTRAVENOUS | Status: DC | PRN
  Administered 2015-04-06: 150 mg via INTRAVENOUS

## 2015-04-06 MED ORDER — FENTANYL CITRATE (PF) 100 MCG/2ML IJ SOLN
INTRAMUSCULAR | Status: AC
  Administered 2015-04-06: 50 ug via INTRAVENOUS
  Filled 2015-04-06: qty 2

## 2015-04-06 MED ORDER — PHENYLEPHRINE HCL 10 MG/ML IJ SOLN
INTRAMUSCULAR | Status: DC | PRN
  Administered 2015-04-06 (×3): 100 ug via INTRAVENOUS
  Administered 2015-04-06: 50 ug via INTRAVENOUS
  Administered 2015-04-06: 100 ug via INTRAVENOUS

## 2015-04-06 MED ORDER — METOCLOPRAMIDE HCL 10 MG PO TABS
5.0000 mg | ORAL_TABLET | Freq: Three times a day (TID) | ORAL | Status: DC | PRN
Start: 2015-04-06 — End: 2015-04-06

## 2015-04-06 MED ORDER — BUPIVACAINE-EPINEPHRINE (PF) 0.25% -1:200000 IJ SOLN
INTRAMUSCULAR | Status: AC
  Filled 2015-04-06: qty 30

## 2015-04-06 MED ORDER — MIDAZOLAM HCL 5 MG/ML IJ SOLN: 1.0000 mg | Freq: Once | INTRAMUSCULAR | Status: DC

## 2015-04-06 MED ORDER — MIDAZOLAM HCL 5 MG/5ML IJ SOLN
INTRAMUSCULAR | Status: DC | PRN
  Administered 2015-04-06 (×2): 1 mg via INTRAVENOUS

## 2015-04-06 MED ORDER — ROCURONIUM BROMIDE 100 MG/10ML IV SOLN
INTRAVENOUS | Status: DC | PRN
Start: 2015-04-06 — End: 2015-04-06
  Administered 2015-04-06: 5 mg via INTRAVENOUS
  Administered 2015-04-06: 10 mg via INTRAVENOUS

## 2015-04-06 MED ORDER — FENTANYL CITRATE (PF) 100 MCG/2ML IJ SOLN
50.0000 ug | Freq: Once | INTRAMUSCULAR | Status: AC
  Administered 2015-04-06: 50 ug via INTRAVENOUS

## 2015-04-06 MED ORDER — MIDAZOLAM HCL 5 MG/5ML IJ SOLN
INTRAMUSCULAR | Status: AC
  Administered 2015-04-06: 1 mg via INTRAVENOUS
  Filled 2015-04-06: qty 5

## 2015-04-06 MED ORDER — FENTANYL CITRATE (PF) 100 MCG/2ML IJ SOLN: 25.0000 ug | INTRAMUSCULAR | Status: DC | PRN

## 2015-04-06 SURGICAL SUPPLY — 37 items
ANCHOR SPARTA PEEK 6.5 (Anchor) ×6 IMPLANT
CANISTER SUCT 1200ML W/VALVE (MISCELLANEOUS) ×3 IMPLANT
CLOSURE WOUND 1/2 X4 (GAUZE/BANDAGES/DRESSINGS) ×1
DRAPE INCISE IOBAN 66X45 STRL (DRAPES) ×6 IMPLANT
DRSG DERMACEA 8X12 NADH (GAUZE/BANDAGES/DRESSINGS) ×3 IMPLANT
DRSG TEGADERM 6X8 (GAUZE/BANDAGES/DRESSINGS) ×3 IMPLANT
DRSG TELFA 3X8 NADH (GAUZE/BANDAGES/DRESSINGS) ×3 IMPLANT
DURAPREP 26ML APPLICATOR (WOUND CARE) ×3 IMPLANT
ELECT CAUTERY BLADE 6.4 (BLADE) ×3 IMPLANT
GLOVE BIO SURGEON STRL SZ8 (GLOVE) ×3 IMPLANT
GLOVE BIOGEL M STRL SZ7.5 (GLOVE) ×3 IMPLANT
GLOVE INDICATOR 8.0 STRL GRN (GLOVE) ×3 IMPLANT
GOWN STRL REUS W/ TWL LRG LVL3 (GOWN DISPOSABLE) ×2 IMPLANT
GOWN STRL REUS W/TWL LRG LVL3 (GOWN DISPOSABLE) ×4
GOWN WARM PT ADLT STD (MISCELLANEOUS) ×3 IMPLANT
KIT STABILIZATION SHOULDER (MISCELLANEOUS) ×3 IMPLANT
MASK FACE SPIDER DISP (MASK) ×3 IMPLANT
NDL SAFETY 18GX1.5 (NEEDLE) ×3 IMPLANT
NEEDLE FILTER BLUNT 18X 1/2SAF (NEEDLE) ×2
NEEDLE FILTER BLUNT 18X1 1/2 (NEEDLE) ×1 IMPLANT
NEEDLE MAYO 6 CRC TAPER PT (NEEDLE) ×3 IMPLANT
NS IRRIG 500ML POUR BTL (IV SOLUTION) ×3 IMPLANT
PACK ARTHROSCOPY SHOULDER (MISCELLANEOUS) ×3 IMPLANT
PAD GROUND ADULT SPLIT (MISCELLANEOUS) ×3 IMPLANT
PASSER SUT CAPTURE FIRST (SUTURE) ×3 IMPLANT
RASP SM TEAR CROSS CUT (RASP) ×3 IMPLANT
SLING ULTRA II LG (MISCELLANEOUS) ×3 IMPLANT
STRAP SAFETY BODY (MISCELLANEOUS) ×3 IMPLANT
STRIP CLOSURE SKIN 1/2X4 (GAUZE/BANDAGES/DRESSINGS) ×2 IMPLANT
SUT ETHIBOND 2-0  6-8 CP-2 (SUTURE) ×2
SUT ETHIBOND 2-0 6-8 CP-2 (SUTURE) ×1 IMPLANT
SUT ETHIBOND NAB CT1 #1 30IN (SUTURE) ×3 IMPLANT
SUT VIC AB 0 CT2 27 (SUTURE) ×3 IMPLANT
SUT VIC AB 2-0 CT2 27 (SUTURE) ×3 IMPLANT
SUT VIC AB 4-0 FS2 27 (SUTURE) ×3 IMPLANT
SYRINGE 10CC LL (SYRINGE) ×3 IMPLANT
WRAP SHOULDER HOT/COLD PACK (SOFTGOODS) ×3 IMPLANT

## 2015-04-06 NOTE — Transfer of Care (Signed)
Immediate Anesthesia Transfer of Care Note  Patient: Joanna Mendoza  Procedure(s) Performed: Procedure(s): SUBACROMIAL DECOMPRESSION, ROTATOR CUFF REPAIR (Right)  Patient Location: PACU  Anesthesia Type:General  Level of Consciousness: oriented and patient cooperative  Airway & Oxygen Therapy: Patient Spontanous Breathing and Patient connected to face mask oxygen  Post-op Assessment: Report given to RN and Post -op Vital signs reviewed and stable  Post vital signs: Reviewed and stable  Last Vitals:  Filed Vitals:   04/06/15 1548  BP: 127/64  Pulse: 80  Temp:   Resp: 17    Complications: No apparent anesthesia complications

## 2015-04-06 NOTE — Discharge Instructions (Signed)
°  Instructions after Shoulder Surgery   James P. Holley Bouche., M.D.   Dept. of [facility]  [address] [place], [place]  [address]   Phone: [phone]   Fax: [phone]    DIET:  Drink plenty of non-alcoholic fluids & begin a light diet.  Resume your normal diet the day after surgery.  ACTIVITY:   NO lifting, pushing, or pulling with the operative shoulder or arm.  Keep the sling in place.  At night, you will probably be more comfortable in a recliner with a small pillow behind your elbow.  Begin gently moving the fingers & wrist on a regular basis to avoid stiffness.  You may open the sling to gently move the elbow.   Anticipate starting Physical Therapy after your first office appointment.  Do not drive or operate any equipment until instructed.  WOUND CARE:   Keep the bandage clean and dry.  Continue to use the ice packs periodically to reduce pain and swelling.  You may bathe or shower after the first office visit following surgery.  MEDICATIONS:  You may resume your regular medications.  Please take the pain medication as prescribed.  Do not take pain medication on an empty stomach.  Do not drive or drink alcoholic beverages when taking pain medications.  CALL THE OFFICE FOR:  Temperature above 101 degrees  Excessive bleeding or drainage on the dressing.  Excessive swelling, coldness, or paleness of the fingers.  Persistent nausea and vomiting.  FOLLOW-UP:   You should have an appointment to return to the office in 7-10 days after surgery.     PLEASE NOTE: If you received an interscalene block as part of your anesthesia, the numbing effect will usually resolve within 6-12 hours. Plan to take some pain medication before going to bed even if you are not experiencing much pain      .AMBULATORY SURGERY  DISCHARGE INSTRUCTIONS   1) The drugs that you were given will stay in your system until  tomorrow so for the next 24 hours you should not:  A) Drive an automobile B) Make any legal decisions C) Drink any alcoholic beverage   2) You may resume regular meals tomorrow.  Today it is better to start with liquids and gradually work up to solid foods.  You may eat anything you prefer, but it is better to start with liquids, then soup and crackers, and gradually work up to solid foods.   3) Please notify your doctor immediately if you have any unusual bleeding, trouble breathing, redness and pain at the surgery site, drainage, fever, or pain not relieved by medication. 4)   5) Your post-operative visit with Dr.                                     is: Date:                        Time:    Please call to schedule your post-operative visit.  6) Additional Instructions:

## 2015-04-06 NOTE — Brief Op Note (Signed)
04/06/2015  6:36 PM  PATIENT:  Joanna Mendoza  69 y.o. female  PRE-OPERATIVE DIAGNOSIS:  ROTATOR CUFF TEAR RIGHT SHOULDER  POST-OPERATIVE DIAGNOSIS:  ROTATOR CUFF TEAR RIGHT SHOULDER (supraspinatus)  PROCEDURE:  Procedure(s): SUBACROMIAL DECOMPRESSION, ROTATOR CUFF REPAIR (Right)  SURGEON:  Surgeon(s) and Role:    * Dereck Leep, MD - Primary  ASSISTANTS: none   ANESTHESIA:   general and interscalene block  EBL:  Total I/O In: 1400 [I.V.:1400] Out: 10 [Blood:10]  BLOOD ADMINISTERED:none  DRAINS: none   LOCAL MEDICATIONS USED:  MARCAINE     SPECIMEN:  No Specimen  DISPOSITION OF SPECIMEN:  N/A  COUNTS:  YES  TOURNIQUET:  * No tourniquets in log *  DICTATION: .Dragon Dictation  PLAN OF CARE: Discharge to home after PACU  PATIENT DISPOSITION:  PACU - hemodynamically stable.   Delay start of Pharmacological VTE agent (>24hrs) due to surgical blood loss or risk of bleeding: not applicable

## 2015-04-06 NOTE — Anesthesia Procedure Notes (Signed)
Anesthesia Regional Block:  Interscalene brachial plexus block  Pre-Anesthetic Checklist: ,, timeout performed, Correct Patient, Correct Site, Correct Laterality, Correct Procedure, Correct Position, site marked, Risks and benefits discussed,  Surgical consent,  Pre-op evaluation,  At surgeon's request and post-op pain management  Laterality: Right  Prep: Maximum Sterile Barrier Precautions used and chloraprep       Needles:   Needle Type: Stimiplex     Needle Length: 5cm 5 cm Needle Gauge: 22 and 22 G    Additional Needles:  Procedures: ultrasound guided (picture in chart) and nerve stimulator Interscalene brachial plexus block  Nerve Stimulator or Paresthesia:  Response: 0.4 mA,   Additional Responses:   Narrative:  Start time: 04/06/2015 3:25 PM End time: 04/06/2015 3:35 PM Injection made incrementally with aspirations every 25 mL.  Performed by: Personally  Anesthesiologist: Alvin Critchley  Additional Notes: Time out called.  Patient in 30 degree semireclined position.  The right neck was prepped and draped in sterile fashion, and an US probe was positioned down to identify the subclavian artery and the brachial plexus to the lateral and superior of the artery.  The probe was moved up to the C6 position with the stoplight configuration of nerves Identified.  A skin wheal was made with !% lidocaine plain just lateral to the probe and a 22G stimuplex needle was advanced to elicit a response down to 0.4.  Local of a mix of 0.2% + 0.5% Ropivacaine with 1: 200K epi was incrementaly injected with negative aspiration of heme.  Patient tolerated the procedure well.

## 2015-04-06 NOTE — H&P (Signed)
The patient has been re-examined, and the chart reviewed, and there have been no interval changes to the documented history and physical.    The risks, benefits, and alternatives have been discussed at length. The patient expressed understanding of the risks benefits and agreed with plans for surgical intervention.  Starlena Beil P. Yoselyn Mcglade, Jr. M.D.    

## 2015-04-06 NOTE — Op Note (Signed)
OPERATIVE NOTE  DATE OF SURGERY:  04/06/2015  PATIENT NAME:  Joanna Mendoza   DOB: December 13, 1945  MRN: [id number]   PRE-OPERATIVE DIAGNOSIS: Right rotator cuff tear  POST-OPERATIVE DIAGNOSIS:  Same (supraspinatus)  PROCEDURE:  Right subacromial decompression and rotator cuff repair  SURGEON:  Marciano Sequin., M.D.   ASSISTANT: None  ANESTHESIA: general and Interscalene block  ESTIMATED BLOOD LOSS: 10 mL  FLUIDS REPLACED: 1400 mL of crystalloid  DRAINS: None  IMPLANTS UTILIZED: 2-ArthroCare Spartan 6.5 mm PEEK suture anchors  INDICATIONS FOR SURGERY: Joanna Mendoza is a 69 y.o. year old female who has been seen for complaints of right shoulder pain. MRI demonstrated findings consistent with a rotator cuff tear. After discussion of the risks and benefits of surgical intervention, the patient expressed understanding of the risks benefits and agree with plans for subacromial decompression and rotator cuff repair.   PROCEDURE IN DETAIL: The patient was brought into the operating room and, after adequate general and interscalene block anesthesia was achieved, the patient was placed in a modified beach chair position. The head was secured in the headrest and all bony prominences were well-padded. The patient's right shoulder was cleaned and prepped with alcohol and DuraPrep and draped in the usual sterile fashion. A "timeout" was performed as per usual protocol. The anticipated incision site was injected with 5 mL of 0.25% Marcaine with epinephrine. An anterior oblique incision was made roughly bisecting the anterior aspect of the acromion. The deltoid was split in line and a "deltoid on" approach was utilized by elevating the deltoid off of the acromion in a subperiosteal fashion. The subdeltoid bursa was excised. An inferior anterior wafer of bone was excised from the acromium using an osteotome. The shoulder was placed through a range of motion. There were 2 full-thickness tears of the  supraspinatus tendon without retraction. The tendon was mobilized. A bony trough was created just medial to the greater tuberosity using rongeurs. A total of 2 ArthroCare 6.5 mm Spartan PEEK suture anchors were placed. Sutures were passed through the leading edge of the cuff tendon and then secured. The shoulder was placed through a range of motion with good maintenance of the repair appreciated. The wound was irrigated with copious amounts of normal saline with antibiotic solution. The deltoid was repaired in a side-to-side fashion using interrupted sutures of #1 Ethibond. Subcutaneous tissue was approximated in layers using first #0 Vicryl followed #2-0 Vicryl. Skin was closed with a running subcutaneous suture of #4-0 Vicryl. Steri-Strips were applied followed by application of a sterile dressing. The patient was then placed in a sling.  The patient tolerated the procedure well and was transported to PACU in stable condition.   Harue Pribble P. Holley Bouche M.D.

## 2015-04-06 NOTE — Anesthesia Preprocedure Evaluation (Addendum)
Anesthesia Evaluation  Patient identified by MRN, date of birth, ID band Patient awake    Reviewed: Allergy & Precautions, NPO status , Patient's Chart, lab work & pertinent test results  Airway Mallampati: II  TM Distance: <3 FB Neck ROM: Full    Dental  (+) Poor Dentition   Pulmonary neg pulmonary ROS,    Pulmonary exam normal breath sounds clear to auscultation       Cardiovascular negative cardio ROS Normal cardiovascular exam     Neuro/Psych Anxiety Depression negative neurological ROS     GI/Hepatic Neg liver ROS, GERD  Medicated and Controlled,  Endo/Other    Renal/GU negative Renal ROS     Musculoskeletal  (+) Arthritis , Osteoarthritis,    Abdominal Normal abdominal exam  (+)   Peds  Hematology   Anesthesia Other Findings Positive urine drug screen for amphetamines and benzos  Reproductive/Obstetrics                             Anesthesia Physical Anesthesia Plan  ASA: III  Anesthesia Plan: General   Post-op Pain Management: MAC Combined w/ Regional for Post-op pain   Induction: Intravenous  Airway Management Planned: Oral ETT  Additional Equipment:   Intra-op Plan:   Post-operative Plan: Extubation in OR  Informed Consent: I have reviewed the patients History and Physical, chart, labs and discussed the procedure including the risks, benefits and alternatives for the proposed anesthesia with the patient or authorized representative who has indicated his/her understanding and acceptance.   Dental advisory given  Plan Discussed with: CRNA and Surgeon  Anesthesia Plan Comments: (Patient understands risks and agrees to interscalene block under [place] for post op pain control)       Anesthesia Quick Evaluation

## 2015-04-06 NOTE — Anesthesia Postprocedure Evaluation (Signed)
  Anesthesia Post-op Note  Patient: Joanna Mendoza  Procedure(s) Performed: Procedure(s): SUBACROMIAL DECOMPRESSION, ROTATOR CUFF REPAIR (Right)  Anesthesia type:General  Patient location: PACU  Post pain: Pain level controlled  Post assessment: Post-op Vital signs reviewed, Patient's Cardiovascular Status Stable, Respiratory Function Stable, Patent Airway and No signs of Nausea or vomiting  Post vital signs: Reviewed and stable  Last Vitals:  Filed Vitals:   04/06/15 1858  BP: 127/64  Pulse: 64  Temp:   Resp: 16    Level of consciousness: awake, alert  and patient cooperative  Complications: No apparent anesthesia complications

## 2015-04-07 ENCOUNTER — Encounter: Payer: Self-pay | Admitting: Orthopedic Surgery

## 2015-06-07 DIAGNOSIS — M75101 Unspecified rotator cuff tear or rupture of right shoulder, not specified as traumatic: Secondary | ICD-10-CM | POA: Diagnosis not present

## 2015-06-09 DIAGNOSIS — M75101 Unspecified rotator cuff tear or rupture of right shoulder, not specified as traumatic: Secondary | ICD-10-CM | POA: Diagnosis not present

## 2015-06-09 DIAGNOSIS — F332 Major depressive disorder, recurrent severe without psychotic features: Secondary | ICD-10-CM | POA: Diagnosis not present

## 2015-06-14 DIAGNOSIS — M75101 Unspecified rotator cuff tear or rupture of right shoulder, not specified as traumatic: Secondary | ICD-10-CM | POA: Diagnosis not present

## 2015-06-14 DIAGNOSIS — Z9889 Other specified postprocedural states: Secondary | ICD-10-CM | POA: Diagnosis not present

## 2015-06-16 DIAGNOSIS — M75101 Unspecified rotator cuff tear or rupture of right shoulder, not specified as traumatic: Secondary | ICD-10-CM | POA: Diagnosis not present

## 2015-06-21 DIAGNOSIS — M75101 Unspecified rotator cuff tear or rupture of right shoulder, not specified as traumatic: Secondary | ICD-10-CM | POA: Diagnosis not present

## 2015-06-28 DIAGNOSIS — M75101 Unspecified rotator cuff tear or rupture of right shoulder, not specified as traumatic: Secondary | ICD-10-CM | POA: Diagnosis not present

## 2015-06-30 DIAGNOSIS — M75101 Unspecified rotator cuff tear or rupture of right shoulder, not specified as traumatic: Secondary | ICD-10-CM | POA: Diagnosis not present

## 2015-07-06 DIAGNOSIS — F39 Unspecified mood [affective] disorder: Secondary | ICD-10-CM | POA: Diagnosis not present

## 2015-07-06 DIAGNOSIS — F1021 Alcohol dependence, in remission: Secondary | ICD-10-CM | POA: Diagnosis not present

## 2015-07-06 DIAGNOSIS — F4310 Post-traumatic stress disorder, unspecified: Secondary | ICD-10-CM | POA: Diagnosis not present

## 2015-07-07 DIAGNOSIS — M75101 Unspecified rotator cuff tear or rupture of right shoulder, not specified as traumatic: Secondary | ICD-10-CM | POA: Diagnosis not present

## 2015-07-11 DIAGNOSIS — M25561 Pain in right knee: Secondary | ICD-10-CM | POA: Diagnosis not present

## 2015-07-14 DIAGNOSIS — F332 Major depressive disorder, recurrent severe without psychotic features: Secondary | ICD-10-CM | POA: Diagnosis not present

## 2015-07-14 DIAGNOSIS — M75101 Unspecified rotator cuff tear or rupture of right shoulder, not specified as traumatic: Secondary | ICD-10-CM | POA: Diagnosis not present

## 2015-07-21 DIAGNOSIS — M75101 Unspecified rotator cuff tear or rupture of right shoulder, not specified as traumatic: Secondary | ICD-10-CM | POA: Diagnosis not present

## 2015-07-25 DIAGNOSIS — M25561 Pain in right knee: Secondary | ICD-10-CM | POA: Diagnosis not present

## 2015-07-26 ENCOUNTER — Other Ambulatory Visit: Payer: Self-pay | Admitting: Orthopedic Surgery

## 2015-07-26 DIAGNOSIS — M25561 Pain in right knee: Secondary | ICD-10-CM

## 2015-07-26 DIAGNOSIS — Z9889 Other specified postprocedural states: Secondary | ICD-10-CM | POA: Diagnosis not present

## 2015-08-11 ENCOUNTER — Other Ambulatory Visit: Payer: PPO

## 2015-08-30 ENCOUNTER — Other Ambulatory Visit: Payer: Self-pay | Admitting: Nurse Practitioner

## 2015-08-30 DIAGNOSIS — R1011 Right upper quadrant pain: Secondary | ICD-10-CM | POA: Diagnosis not present

## 2015-08-30 DIAGNOSIS — G8929 Other chronic pain: Secondary | ICD-10-CM

## 2015-08-30 DIAGNOSIS — K760 Fatty (change of) liver, not elsewhere classified: Secondary | ICD-10-CM | POA: Diagnosis not present

## 2015-08-30 DIAGNOSIS — R1013 Epigastric pain: Secondary | ICD-10-CM | POA: Diagnosis not present

## 2015-09-05 ENCOUNTER — Ambulatory Visit: Payer: PPO

## 2015-09-12 ENCOUNTER — Ambulatory Visit
Admission: RE | Admit: 2015-09-12 | Discharge: 2015-09-12 | Disposition: A | Discharge status: Home / Self Care | Payer: PPO | Source: Ambulatory Visit | Attending: Nurse Practitioner | Admitting: Nurse Practitioner

## 2015-09-12 DIAGNOSIS — G8929 Other chronic pain: Secondary | ICD-10-CM | POA: Insufficient documentation

## 2015-09-12 DIAGNOSIS — R1011 Right upper quadrant pain: Secondary | ICD-10-CM | POA: Insufficient documentation

## 2015-09-16 DIAGNOSIS — F39 Unspecified mood [affective] disorder: Secondary | ICD-10-CM | POA: Diagnosis not present

## 2015-09-16 DIAGNOSIS — F1021 Alcohol dependence, in remission: Secondary | ICD-10-CM | POA: Diagnosis not present

## 2015-09-16 DIAGNOSIS — F4310 Post-traumatic stress disorder, unspecified: Secondary | ICD-10-CM | POA: Diagnosis not present

## 2015-10-12 DIAGNOSIS — M170 Bilateral primary osteoarthritis of knee: Secondary | ICD-10-CM | POA: Diagnosis not present

## 2015-10-12 DIAGNOSIS — M25561 Pain in right knee: Secondary | ICD-10-CM | POA: Diagnosis not present

## 2015-10-21 DIAGNOSIS — F1021 Alcohol dependence, in remission: Secondary | ICD-10-CM | POA: Diagnosis not present

## 2015-10-21 DIAGNOSIS — F4310 Post-traumatic stress disorder, unspecified: Secondary | ICD-10-CM | POA: Diagnosis not present

## 2015-10-21 DIAGNOSIS — F39 Unspecified mood [affective] disorder: Secondary | ICD-10-CM | POA: Diagnosis not present

## 2015-12-08 DIAGNOSIS — M5136 Other intervertebral disc degeneration, lumbar region: Secondary | ICD-10-CM | POA: Diagnosis not present

## 2015-12-08 DIAGNOSIS — E782 Mixed hyperlipidemia: Secondary | ICD-10-CM | POA: Diagnosis not present

## 2015-12-16 ENCOUNTER — Other Ambulatory Visit: Payer: Self-pay | Admitting: Internal Medicine

## 2015-12-16 DIAGNOSIS — M4696 Unspecified inflammatory spondylopathy, lumbar region: Secondary | ICD-10-CM | POA: Diagnosis not present

## 2015-12-16 DIAGNOSIS — Z1239 Encounter for other screening for malignant neoplasm of breast: Secondary | ICD-10-CM | POA: Diagnosis not present

## 2015-12-16 DIAGNOSIS — K760 Fatty (change of) liver, not elsewhere classified: Secondary | ICD-10-CM | POA: Diagnosis not present

## 2015-12-16 DIAGNOSIS — E782 Mixed hyperlipidemia: Secondary | ICD-10-CM | POA: Diagnosis not present

## 2015-12-16 DIAGNOSIS — R311 Benign essential microscopic hematuria: Secondary | ICD-10-CM | POA: Diagnosis not present

## 2015-12-16 DIAGNOSIS — Z1231 Encounter for screening mammogram for malignant neoplasm of breast: Secondary | ICD-10-CM

## 2015-12-16 DIAGNOSIS — M5136 Other intervertebral disc degeneration, lumbar region: Secondary | ICD-10-CM | POA: Diagnosis not present

## 2015-12-16 DIAGNOSIS — Z0000 Encounter for general adult medical examination without abnormal findings: Secondary | ICD-10-CM | POA: Diagnosis not present

## 2015-12-16 DIAGNOSIS — F3342 Major depressive disorder, recurrent, in full remission: Secondary | ICD-10-CM | POA: Diagnosis not present

## 2016-01-06 ENCOUNTER — Other Ambulatory Visit: Payer: Self-pay | Admitting: Internal Medicine

## 2016-01-06 ENCOUNTER — Ambulatory Visit
Admission: RE | Admit: 2016-01-06 | Discharge: 2016-01-06 | Disposition: A | Discharge status: Home / Self Care | Payer: PPO | Source: Ambulatory Visit | Attending: Internal Medicine | Admitting: Internal Medicine

## 2016-01-06 DIAGNOSIS — Z1231 Encounter for screening mammogram for malignant neoplasm of breast: Secondary | ICD-10-CM

## 2016-01-20 DIAGNOSIS — F39 Unspecified mood [affective] disorder: Secondary | ICD-10-CM | POA: Diagnosis not present

## 2016-01-20 DIAGNOSIS — F1021 Alcohol dependence, in remission: Secondary | ICD-10-CM | POA: Diagnosis not present

## 2016-01-20 DIAGNOSIS — F4310 Post-traumatic stress disorder, unspecified: Secondary | ICD-10-CM | POA: Diagnosis not present

## 2016-01-26 DIAGNOSIS — F4310 Post-traumatic stress disorder, unspecified: Secondary | ICD-10-CM | POA: Diagnosis not present

## 2016-01-26 DIAGNOSIS — F1021 Alcohol dependence, in remission: Secondary | ICD-10-CM | POA: Diagnosis not present

## 2016-01-26 DIAGNOSIS — F39 Unspecified mood [affective] disorder: Secondary | ICD-10-CM | POA: Diagnosis not present

## 2016-01-31 DIAGNOSIS — F332 Major depressive disorder, recurrent severe without psychotic features: Secondary | ICD-10-CM | POA: Diagnosis not present

## 2016-02-21 DIAGNOSIS — F332 Major depressive disorder, recurrent severe without psychotic features: Secondary | ICD-10-CM | POA: Diagnosis not present

## 2016-02-23 DIAGNOSIS — H2513 Age-related nuclear cataract, bilateral: Secondary | ICD-10-CM | POA: Diagnosis not present

## 2016-03-12 DIAGNOSIS — F1021 Alcohol dependence, in remission: Secondary | ICD-10-CM | POA: Diagnosis not present

## 2016-03-12 DIAGNOSIS — F4310 Post-traumatic stress disorder, unspecified: Secondary | ICD-10-CM | POA: Diagnosis not present

## 2016-03-12 DIAGNOSIS — F39 Unspecified mood [affective] disorder: Secondary | ICD-10-CM | POA: Diagnosis not present

## 2016-05-17 DIAGNOSIS — F332 Major depressive disorder, recurrent severe without psychotic features: Secondary | ICD-10-CM | POA: Diagnosis not present

## 2016-06-05 DIAGNOSIS — F39 Unspecified mood [affective] disorder: Secondary | ICD-10-CM | POA: Diagnosis not present

## 2016-06-05 DIAGNOSIS — F4310 Post-traumatic stress disorder, unspecified: Secondary | ICD-10-CM | POA: Diagnosis not present

## 2016-06-05 DIAGNOSIS — F1021 Alcohol dependence, in remission: Secondary | ICD-10-CM | POA: Diagnosis not present

## 2016-06-12 DIAGNOSIS — R311 Benign essential microscopic hematuria: Secondary | ICD-10-CM | POA: Diagnosis not present

## 2016-06-12 DIAGNOSIS — E782 Mixed hyperlipidemia: Secondary | ICD-10-CM | POA: Diagnosis not present

## 2016-06-19 DIAGNOSIS — F3342 Major depressive disorder, recurrent, in full remission: Secondary | ICD-10-CM | POA: Diagnosis not present

## 2016-06-19 DIAGNOSIS — R002 Palpitations: Secondary | ICD-10-CM | POA: Diagnosis not present

## 2016-06-19 DIAGNOSIS — M4696 Unspecified inflammatory spondylopathy, lumbar region: Secondary | ICD-10-CM | POA: Diagnosis not present

## 2016-06-19 DIAGNOSIS — E782 Mixed hyperlipidemia: Secondary | ICD-10-CM | POA: Diagnosis not present

## 2016-06-19 DIAGNOSIS — R1011 Right upper quadrant pain: Secondary | ICD-10-CM | POA: Diagnosis not present

## 2016-06-19 DIAGNOSIS — M5136 Other intervertebral disc degeneration, lumbar region: Secondary | ICD-10-CM | POA: Diagnosis not present

## 2016-06-19 DIAGNOSIS — G8929 Other chronic pain: Secondary | ICD-10-CM | POA: Diagnosis not present

## 2016-06-26 DIAGNOSIS — R002 Palpitations: Secondary | ICD-10-CM | POA: Diagnosis not present

## 2016-06-27 DIAGNOSIS — I471 Supraventricular tachycardia: Secondary | ICD-10-CM | POA: Insufficient documentation

## 2016-07-18 DIAGNOSIS — E782 Mixed hyperlipidemia: Secondary | ICD-10-CM | POA: Diagnosis not present

## 2016-07-18 DIAGNOSIS — I471 Supraventricular tachycardia: Secondary | ICD-10-CM | POA: Diagnosis not present

## 2016-07-18 DIAGNOSIS — R42 Dizziness and giddiness: Secondary | ICD-10-CM | POA: Diagnosis not present

## 2016-07-24 DIAGNOSIS — F332 Major depressive disorder, recurrent severe without psychotic features: Secondary | ICD-10-CM | POA: Diagnosis not present

## 2016-08-06 DIAGNOSIS — I471 Supraventricular tachycardia: Secondary | ICD-10-CM | POA: Diagnosis not present

## 2016-08-21 DIAGNOSIS — F332 Major depressive disorder, recurrent severe without psychotic features: Secondary | ICD-10-CM | POA: Diagnosis not present

## 2016-08-22 DIAGNOSIS — I493 Ventricular premature depolarization: Secondary | ICD-10-CM | POA: Insufficient documentation

## 2016-08-22 DIAGNOSIS — I471 Supraventricular tachycardia: Secondary | ICD-10-CM | POA: Diagnosis not present

## 2016-08-22 DIAGNOSIS — E782 Mixed hyperlipidemia: Secondary | ICD-10-CM | POA: Diagnosis not present

## 2016-09-05 DIAGNOSIS — F39 Unspecified mood [affective] disorder: Secondary | ICD-10-CM | POA: Diagnosis not present

## 2016-09-05 DIAGNOSIS — F4310 Post-traumatic stress disorder, unspecified: Secondary | ICD-10-CM | POA: Diagnosis not present

## 2016-09-05 DIAGNOSIS — F1021 Alcohol dependence, in remission: Secondary | ICD-10-CM | POA: Diagnosis not present

## 2016-09-07 DIAGNOSIS — M5136 Other intervertebral disc degeneration, lumbar region: Secondary | ICD-10-CM | POA: Diagnosis not present

## 2016-09-07 DIAGNOSIS — G2581 Restless legs syndrome: Secondary | ICD-10-CM | POA: Diagnosis not present

## 2016-09-07 DIAGNOSIS — M4696 Unspecified inflammatory spondylopathy, lumbar region: Secondary | ICD-10-CM | POA: Diagnosis not present

## 2016-09-07 DIAGNOSIS — I471 Supraventricular tachycardia: Secondary | ICD-10-CM | POA: Diagnosis not present

## 2016-09-07 DIAGNOSIS — F3342 Major depressive disorder, recurrent, in full remission: Secondary | ICD-10-CM | POA: Diagnosis not present

## 2016-10-03 DIAGNOSIS — F332 Major depressive disorder, recurrent severe without psychotic features: Secondary | ICD-10-CM | POA: Diagnosis not present

## 2016-11-02 DIAGNOSIS — F39 Unspecified mood [affective] disorder: Secondary | ICD-10-CM | POA: Diagnosis not present

## 2016-11-02 DIAGNOSIS — F4310 Post-traumatic stress disorder, unspecified: Secondary | ICD-10-CM | POA: Diagnosis not present

## 2016-11-02 DIAGNOSIS — G2581 Restless legs syndrome: Secondary | ICD-10-CM | POA: Diagnosis not present

## 2016-11-02 DIAGNOSIS — F1021 Alcohol dependence, in remission: Secondary | ICD-10-CM | POA: Diagnosis not present

## 2016-11-07 DIAGNOSIS — G2581 Restless legs syndrome: Secondary | ICD-10-CM | POA: Diagnosis not present

## 2016-11-08 DIAGNOSIS — M25562 Pain in left knee: Secondary | ICD-10-CM | POA: Diagnosis not present

## 2016-11-08 DIAGNOSIS — M1712 Unilateral primary osteoarthritis, left knee: Secondary | ICD-10-CM | POA: Diagnosis not present

## 2016-11-12 DIAGNOSIS — F4310 Post-traumatic stress disorder, unspecified: Secondary | ICD-10-CM | POA: Diagnosis not present

## 2016-11-12 DIAGNOSIS — F1021 Alcohol dependence, in remission: Secondary | ICD-10-CM | POA: Diagnosis not present

## 2016-11-12 DIAGNOSIS — F39 Unspecified mood [affective] disorder: Secondary | ICD-10-CM | POA: Diagnosis not present

## 2016-11-14 DIAGNOSIS — F332 Major depressive disorder, recurrent severe without psychotic features: Secondary | ICD-10-CM | POA: Diagnosis not present

## 2016-11-22 DIAGNOSIS — G4709 Other insomnia: Secondary | ICD-10-CM | POA: Insufficient documentation

## 2016-11-27 DIAGNOSIS — E782 Mixed hyperlipidemia: Secondary | ICD-10-CM | POA: Diagnosis not present

## 2016-11-27 DIAGNOSIS — Z79899 Other long term (current) drug therapy: Secondary | ICD-10-CM | POA: Diagnosis not present

## 2016-11-27 DIAGNOSIS — R350 Frequency of micturition: Secondary | ICD-10-CM | POA: Diagnosis not present

## 2016-11-27 DIAGNOSIS — R5383 Other fatigue: Secondary | ICD-10-CM | POA: Diagnosis not present

## 2016-11-28 DIAGNOSIS — F39 Unspecified mood [affective] disorder: Secondary | ICD-10-CM | POA: Diagnosis not present

## 2016-11-28 DIAGNOSIS — F4310 Post-traumatic stress disorder, unspecified: Secondary | ICD-10-CM | POA: Diagnosis not present

## 2016-11-28 DIAGNOSIS — F1021 Alcohol dependence, in remission: Secondary | ICD-10-CM | POA: Diagnosis not present

## 2016-12-18 DIAGNOSIS — F39 Unspecified mood [affective] disorder: Secondary | ICD-10-CM | POA: Diagnosis not present

## 2016-12-18 DIAGNOSIS — F1021 Alcohol dependence, in remission: Secondary | ICD-10-CM | POA: Diagnosis not present

## 2016-12-18 DIAGNOSIS — F4310 Post-traumatic stress disorder, unspecified: Secondary | ICD-10-CM | POA: Diagnosis not present

## 2016-12-19 DIAGNOSIS — G8929 Other chronic pain: Secondary | ICD-10-CM | POA: Diagnosis not present

## 2016-12-19 DIAGNOSIS — M4696 Unspecified inflammatory spondylopathy, lumbar region: Secondary | ICD-10-CM | POA: Diagnosis not present

## 2016-12-19 DIAGNOSIS — E782 Mixed hyperlipidemia: Secondary | ICD-10-CM | POA: Diagnosis not present

## 2016-12-19 DIAGNOSIS — R1011 Right upper quadrant pain: Secondary | ICD-10-CM | POA: Diagnosis not present

## 2016-12-19 DIAGNOSIS — M5136 Other intervertebral disc degeneration, lumbar region: Secondary | ICD-10-CM | POA: Diagnosis not present

## 2016-12-26 ENCOUNTER — Other Ambulatory Visit: Payer: Self-pay | Admitting: Internal Medicine

## 2016-12-26 DIAGNOSIS — E782 Mixed hyperlipidemia: Secondary | ICD-10-CM | POA: Diagnosis not present

## 2016-12-26 DIAGNOSIS — Z1231 Encounter for screening mammogram for malignant neoplasm of breast: Secondary | ICD-10-CM | POA: Diagnosis not present

## 2016-12-26 DIAGNOSIS — R739 Hyperglycemia, unspecified: Secondary | ICD-10-CM | POA: Diagnosis not present

## 2016-12-26 DIAGNOSIS — M4696 Unspecified inflammatory spondylopathy, lumbar region: Secondary | ICD-10-CM | POA: Diagnosis not present

## 2016-12-26 DIAGNOSIS — M5136 Other intervertebral disc degeneration, lumbar region: Secondary | ICD-10-CM | POA: Diagnosis not present

## 2016-12-26 DIAGNOSIS — Z0000 Encounter for general adult medical examination without abnormal findings: Secondary | ICD-10-CM | POA: Diagnosis not present

## 2016-12-26 DIAGNOSIS — I471 Supraventricular tachycardia: Secondary | ICD-10-CM | POA: Diagnosis not present

## 2016-12-26 DIAGNOSIS — M8589 Other specified disorders of bone density and structure, multiple sites: Secondary | ICD-10-CM | POA: Diagnosis not present

## 2016-12-26 DIAGNOSIS — K760 Fatty (change of) liver, not elsewhere classified: Secondary | ICD-10-CM | POA: Diagnosis not present

## 2016-12-26 DIAGNOSIS — I493 Ventricular premature depolarization: Secondary | ICD-10-CM | POA: Diagnosis not present

## 2016-12-26 DIAGNOSIS — F3342 Major depressive disorder, recurrent, in full remission: Secondary | ICD-10-CM | POA: Diagnosis not present

## 2017-01-01 DIAGNOSIS — F1021 Alcohol dependence, in remission: Secondary | ICD-10-CM | POA: Diagnosis not present

## 2017-01-01 DIAGNOSIS — F4310 Post-traumatic stress disorder, unspecified: Secondary | ICD-10-CM | POA: Diagnosis not present

## 2017-01-01 DIAGNOSIS — F39 Unspecified mood [affective] disorder: Secondary | ICD-10-CM | POA: Diagnosis not present

## 2017-01-02 DIAGNOSIS — F1021 Alcohol dependence, in remission: Secondary | ICD-10-CM | POA: Diagnosis not present

## 2017-01-02 DIAGNOSIS — F4310 Post-traumatic stress disorder, unspecified: Secondary | ICD-10-CM | POA: Diagnosis not present

## 2017-01-02 DIAGNOSIS — F39 Unspecified mood [affective] disorder: Secondary | ICD-10-CM | POA: Diagnosis not present

## 2017-01-07 DIAGNOSIS — M1712 Unilateral primary osteoarthritis, left knee: Secondary | ICD-10-CM | POA: Diagnosis not present

## 2017-01-17 ENCOUNTER — Ambulatory Visit
Admission: RE | Admit: 2017-01-17 | Discharge: 2017-01-17 | Disposition: A | Discharge status: Home / Self Care | Payer: PPO | Source: Ambulatory Visit | Attending: Internal Medicine | Admitting: Internal Medicine

## 2017-01-17 DIAGNOSIS — Z1231 Encounter for screening mammogram for malignant neoplasm of breast: Secondary | ICD-10-CM | POA: Diagnosis not present

## 2017-01-23 DIAGNOSIS — F39 Unspecified mood [affective] disorder: Secondary | ICD-10-CM | POA: Diagnosis not present

## 2017-01-23 DIAGNOSIS — F4310 Post-traumatic stress disorder, unspecified: Secondary | ICD-10-CM | POA: Diagnosis not present

## 2017-01-23 DIAGNOSIS — F1021 Alcohol dependence, in remission: Secondary | ICD-10-CM | POA: Diagnosis not present

## 2017-02-01 DIAGNOSIS — F4310 Post-traumatic stress disorder, unspecified: Secondary | ICD-10-CM | POA: Diagnosis not present

## 2017-02-01 DIAGNOSIS — F39 Unspecified mood [affective] disorder: Secondary | ICD-10-CM | POA: Diagnosis not present

## 2017-02-01 DIAGNOSIS — F1021 Alcohol dependence, in remission: Secondary | ICD-10-CM | POA: Diagnosis not present

## 2017-02-13 DIAGNOSIS — F4310 Post-traumatic stress disorder, unspecified: Secondary | ICD-10-CM | POA: Diagnosis not present

## 2017-02-13 DIAGNOSIS — F39 Unspecified mood [affective] disorder: Secondary | ICD-10-CM | POA: Diagnosis not present

## 2017-02-13 DIAGNOSIS — F1021 Alcohol dependence, in remission: Secondary | ICD-10-CM | POA: Diagnosis not present

## 2017-03-06 DIAGNOSIS — F1021 Alcohol dependence, in remission: Secondary | ICD-10-CM | POA: Diagnosis not present

## 2017-03-06 DIAGNOSIS — F4310 Post-traumatic stress disorder, unspecified: Secondary | ICD-10-CM | POA: Diagnosis not present

## 2017-03-06 DIAGNOSIS — F39 Unspecified mood [affective] disorder: Secondary | ICD-10-CM | POA: Diagnosis not present

## 2017-03-15 DIAGNOSIS — M1712 Unilateral primary osteoarthritis, left knee: Secondary | ICD-10-CM | POA: Diagnosis not present

## 2017-04-10 DIAGNOSIS — F39 Unspecified mood [affective] disorder: Secondary | ICD-10-CM | POA: Diagnosis not present

## 2017-04-10 DIAGNOSIS — F1021 Alcohol dependence, in remission: Secondary | ICD-10-CM | POA: Diagnosis not present

## 2017-04-10 DIAGNOSIS — F4310 Post-traumatic stress disorder, unspecified: Secondary | ICD-10-CM | POA: Diagnosis not present

## 2017-04-12 DIAGNOSIS — F1021 Alcohol dependence, in remission: Secondary | ICD-10-CM | POA: Diagnosis not present

## 2017-04-12 DIAGNOSIS — F4310 Post-traumatic stress disorder, unspecified: Secondary | ICD-10-CM | POA: Diagnosis not present

## 2017-04-12 DIAGNOSIS — F39 Unspecified mood [affective] disorder: Secondary | ICD-10-CM | POA: Diagnosis not present

## 2017-05-22 DIAGNOSIS — F39 Unspecified mood [affective] disorder: Secondary | ICD-10-CM | POA: Diagnosis not present

## 2017-05-22 DIAGNOSIS — F1021 Alcohol dependence, in remission: Secondary | ICD-10-CM | POA: Diagnosis not present

## 2017-05-22 DIAGNOSIS — F4310 Post-traumatic stress disorder, unspecified: Secondary | ICD-10-CM | POA: Diagnosis not present

## 2017-06-10 DIAGNOSIS — R109 Unspecified abdominal pain: Secondary | ICD-10-CM | POA: Diagnosis not present

## 2017-06-10 DIAGNOSIS — R1011 Right upper quadrant pain: Secondary | ICD-10-CM | POA: Diagnosis not present

## 2017-06-10 DIAGNOSIS — F3342 Major depressive disorder, recurrent, in full remission: Secondary | ICD-10-CM | POA: Diagnosis not present

## 2017-06-10 DIAGNOSIS — M5136 Other intervertebral disc degeneration, lumbar region: Secondary | ICD-10-CM | POA: Diagnosis not present

## 2017-06-10 DIAGNOSIS — R110 Nausea: Secondary | ICD-10-CM | POA: Diagnosis not present

## 2017-06-10 DIAGNOSIS — K760 Fatty (change of) liver, not elsewhere classified: Secondary | ICD-10-CM | POA: Diagnosis not present

## 2017-06-10 DIAGNOSIS — G8929 Other chronic pain: Secondary | ICD-10-CM | POA: Diagnosis not present

## 2017-06-10 DIAGNOSIS — I471 Supraventricular tachycardia: Secondary | ICD-10-CM | POA: Diagnosis not present

## 2017-06-10 DIAGNOSIS — M4696 Unspecified inflammatory spondylopathy, lumbar region: Secondary | ICD-10-CM | POA: Diagnosis not present

## 2017-06-10 DIAGNOSIS — E782 Mixed hyperlipidemia: Secondary | ICD-10-CM | POA: Diagnosis not present

## 2017-06-27 DIAGNOSIS — F4310 Post-traumatic stress disorder, unspecified: Secondary | ICD-10-CM | POA: Diagnosis not present

## 2017-06-27 DIAGNOSIS — F39 Unspecified mood [affective] disorder: Secondary | ICD-10-CM | POA: Diagnosis not present

## 2017-06-27 DIAGNOSIS — F1021 Alcohol dependence, in remission: Secondary | ICD-10-CM | POA: Diagnosis not present

## 2017-07-08 ENCOUNTER — Other Ambulatory Visit: Payer: Self-pay | Admitting: Nurse Practitioner

## 2017-07-08 DIAGNOSIS — Z86010 Personal history of colonic polyps: Secondary | ICD-10-CM | POA: Insufficient documentation

## 2017-07-08 DIAGNOSIS — R110 Nausea: Secondary | ICD-10-CM | POA: Diagnosis not present

## 2017-07-08 DIAGNOSIS — K760 Fatty (change of) liver, not elsewhere classified: Secondary | ICD-10-CM | POA: Diagnosis not present

## 2017-07-08 DIAGNOSIS — R1011 Right upper quadrant pain: Secondary | ICD-10-CM | POA: Diagnosis not present

## 2017-07-08 DIAGNOSIS — R1013 Epigastric pain: Secondary | ICD-10-CM | POA: Diagnosis not present

## 2017-07-08 DIAGNOSIS — G8929 Other chronic pain: Secondary | ICD-10-CM | POA: Diagnosis not present

## 2017-07-08 DIAGNOSIS — K5909 Other constipation: Secondary | ICD-10-CM | POA: Diagnosis not present

## 2017-07-09 DIAGNOSIS — F39 Unspecified mood [affective] disorder: Secondary | ICD-10-CM | POA: Diagnosis not present

## 2017-07-09 DIAGNOSIS — F4310 Post-traumatic stress disorder, unspecified: Secondary | ICD-10-CM | POA: Diagnosis not present

## 2017-07-09 DIAGNOSIS — F1021 Alcohol dependence, in remission: Secondary | ICD-10-CM | POA: Diagnosis not present

## 2017-07-11 ENCOUNTER — Other Ambulatory Visit: Payer: Self-pay | Admitting: Nurse Practitioner

## 2017-07-11 DIAGNOSIS — K760 Fatty (change of) liver, not elsewhere classified: Secondary | ICD-10-CM | POA: Diagnosis not present

## 2017-07-11 DIAGNOSIS — E782 Mixed hyperlipidemia: Secondary | ICD-10-CM | POA: Diagnosis not present

## 2017-07-11 DIAGNOSIS — M8589 Other specified disorders of bone density and structure, multiple sites: Secondary | ICD-10-CM | POA: Diagnosis not present

## 2017-07-11 DIAGNOSIS — R110 Nausea: Secondary | ICD-10-CM | POA: Diagnosis not present

## 2017-07-11 DIAGNOSIS — M5136 Other intervertebral disc degeneration, lumbar region: Secondary | ICD-10-CM | POA: Diagnosis not present

## 2017-07-11 DIAGNOSIS — F3342 Major depressive disorder, recurrent, in full remission: Secondary | ICD-10-CM | POA: Diagnosis not present

## 2017-07-11 DIAGNOSIS — M4696 Unspecified inflammatory spondylopathy, lumbar region: Secondary | ICD-10-CM | POA: Diagnosis not present

## 2017-07-11 DIAGNOSIS — I471 Supraventricular tachycardia: Secondary | ICD-10-CM | POA: Diagnosis not present

## 2017-07-11 DIAGNOSIS — R1011 Right upper quadrant pain: Secondary | ICD-10-CM

## 2017-07-11 DIAGNOSIS — G2581 Restless legs syndrome: Secondary | ICD-10-CM | POA: Diagnosis not present

## 2017-07-16 ENCOUNTER — Ambulatory Visit
Admission: RE | Admit: 2017-07-16 | Discharge: 2017-07-16 | Disposition: A | Discharge status: Home / Self Care | Payer: PPO | Source: Ambulatory Visit | Attending: Nurse Practitioner | Admitting: Nurse Practitioner

## 2017-07-16 DIAGNOSIS — K769 Liver disease, unspecified: Secondary | ICD-10-CM | POA: Insufficient documentation

## 2017-07-16 DIAGNOSIS — R1011 Right upper quadrant pain: Secondary | ICD-10-CM

## 2017-07-16 DIAGNOSIS — K760 Fatty (change of) liver, not elsewhere classified: Secondary | ICD-10-CM

## 2017-07-16 DIAGNOSIS — Z9049 Acquired absence of other specified parts of digestive tract: Secondary | ICD-10-CM | POA: Diagnosis not present

## 2017-07-16 DIAGNOSIS — R110 Nausea: Secondary | ICD-10-CM | POA: Diagnosis present

## 2017-07-16 DIAGNOSIS — N281 Cyst of kidney, acquired: Secondary | ICD-10-CM | POA: Diagnosis not present

## 2017-07-16 DIAGNOSIS — G8929 Other chronic pain: Secondary | ICD-10-CM | POA: Diagnosis present

## 2017-07-23 ENCOUNTER — Other Ambulatory Visit: Payer: Self-pay

## 2017-07-23 DIAGNOSIS — F1021 Alcohol dependence, in remission: Secondary | ICD-10-CM | POA: Diagnosis not present

## 2017-07-23 DIAGNOSIS — F4310 Post-traumatic stress disorder, unspecified: Secondary | ICD-10-CM | POA: Diagnosis not present

## 2017-07-23 DIAGNOSIS — F39 Unspecified mood [affective] disorder: Secondary | ICD-10-CM | POA: Diagnosis not present

## 2017-07-29 ENCOUNTER — Ambulatory Visit: Payer: PPO | Admitting: Urology

## 2017-07-29 ENCOUNTER — Encounter: Payer: Self-pay | Admitting: Urology

## 2017-07-29 VITALS — BP 125/76 | HR 116 | Ht 59.0 in | Wt 159.0 lb

## 2017-07-29 DIAGNOSIS — R350 Frequency of micturition: Secondary | ICD-10-CM | POA: Diagnosis not present

## 2017-07-29 DIAGNOSIS — R310 Gross hematuria: Secondary | ICD-10-CM | POA: Diagnosis not present

## 2017-07-29 DIAGNOSIS — N281 Cyst of kidney, acquired: Secondary | ICD-10-CM | POA: Diagnosis not present

## 2017-07-29 DIAGNOSIS — N3941 Urge incontinence: Secondary | ICD-10-CM | POA: Diagnosis not present

## 2017-07-29 DIAGNOSIS — R3915 Urgency of urination: Secondary | ICD-10-CM

## 2017-07-29 DIAGNOSIS — R319 Hematuria, unspecified: Secondary | ICD-10-CM | POA: Diagnosis not present

## 2017-07-29 LAB — MICROSCOPIC EXAMINATION
Bacteria, UA: NONE SEEN
RBC MICROSCOPIC, UA: NONE SEEN /HPF (ref 0–?)

## 2017-07-29 LAB — URINALYSIS, COMPLETE
Bilirubin, UA: NEGATIVE
Glucose, UA: NEGATIVE
KETONES UA: NEGATIVE
NITRITE UA: NEGATIVE
Protein, UA: NEGATIVE
Specific Gravity, UA: 1.01 (ref 1.005–1.030)
Urobilinogen, Ur: 0.2 mg/dL (ref 0.2–1.0)
pH, UA: 6 (ref 5.0–7.5)

## 2017-07-30 ENCOUNTER — Encounter: Payer: Self-pay | Admitting: Urology

## 2017-07-30 DIAGNOSIS — R3915 Urgency of urination: Secondary | ICD-10-CM | POA: Insufficient documentation

## 2017-07-30 DIAGNOSIS — N3941 Urge incontinence: Secondary | ICD-10-CM | POA: Insufficient documentation

## 2017-07-30 DIAGNOSIS — R350 Frequency of micturition: Secondary | ICD-10-CM | POA: Insufficient documentation

## 2017-07-30 DIAGNOSIS — N281 Cyst of kidney, acquired: Secondary | ICD-10-CM | POA: Insufficient documentation

## 2017-07-30 DIAGNOSIS — R3129 Other microscopic hematuria: Secondary | ICD-10-CM | POA: Insufficient documentation

## 2017-07-30 MED ORDER — SOLIFENACIN SUCCINATE 5 MG PO TABS: 5.0000 mg | ORAL_TABLET | Freq: Every day | ORAL | 1 refills | Status: DC

## 2017-07-30 NOTE — Progress Notes (Signed)
07/29/2017 6:54 AM   [name] Age [birth year] [id number]  Referring provider: Adin Hector, MD [facility] [place], [place] [address]  Chief Complaint  Patient presents with  . Initial Assessment    HPI: Joanna Mendoza is a 72 year old female seen at the request of Dr. Caryl Comes for evaluation of microhematuria. Urinalysis in July 2018 showed trace blood on dipstick but no significant RBCs on microscopy.  There were 4-10 WBCs.  A repeat urinalysis in January 2019 again showed no significant microhematuria however 4-10 WBCs.  An abdominal ultrasound performed in November 2019 showed a 1 cm simple cyst in the left midpole.  No renal mass, calculi or hydronephrosis was identified.  She does complain of urinary frequency, urgency with occasional episodes of urge incontinence.  She will also have leakage at night that she is not aware of.  Denies dysuria or gross hematuria.  Denies flank, abdominal, pelvic pain.  She apparently underwent stress urinary incontinence surgery several years ago.  She denies stress incontinence.   PMH: Past Medical History:  Diagnosis Date  . Anxiety   . Anxiety   . Arthritis   . Depression   . Fatty liver   . GERD (gastroesophageal reflux disease)   . IBS (irritable bowel syndrome)     Surgical History: Past Surgical History:  Procedure Laterality Date  . ABDOMINAL HYSTERECTOMY    . BREAST BIOPSY Left 2002   neg  . BREAST SURGERY Left 2002   Breast Biopsy  . CHOLECYSTECTOMY  2011  . KNEE ARTHROSCOPY Left 2008  . SHOULDER ACROMIOPLASTY Right 04/06/2015   Procedure: SUBACROMIAL DECOMPRESSION, ROTATOR CUFF REPAIR;  Surgeon: Dereck Leep, MD;  Location: [facility] ORS;  Service: Orthopedics;  Laterality: Right;    Home Medications:  Allergies as of 07/29/2017      Reactions   Celexa [citalopram] Other (See Comments)   "unknown"   Cogentin [benztropine] Other (See Comments)   "unknown"   Flagyl [metronidazole] Other  (See Comments)   "unknown"   Adhesive [tape] Rash      Medication List        Accurate as of 07/29/17 11:59 PM. Always use your most recent med list.          calcium carbonate 600 MG Tabs tablet Commonly known as:  OS-CAL Take 600 mg by mouth daily.   cholecalciferol 1000 units tablet Commonly known as:  VITAMIN D Take 1,000 Units by mouth daily.   Chromium 500 MCG Tabs Take 500 mcg by mouth daily.   citalopram 20 MG tablet Commonly known as:  CELEXA Take 20 mg by mouth daily.   DULoxetine 60 MG capsule Commonly known as:  CYMBALTA Take 60 mg by mouth daily.   estradiol 0.1 mg/24hr patch Commonly known as:  CLIMARA - Dosed in mg/24 hr Place 0.1 mg onto the skin once a week.   gabapentin 100 MG capsule Commonly known as:  NEURONTIN Take 100 mg by mouth 2 (two) times daily.   HYDROcodone-acetaminophen 5-325 MG tablet Commonly known as:  NORCO Take 1-2 tablets by mouth every 4 (four) hours as needed for moderate pain.   hydrOXYzine 50 MG capsule Commonly known as:  VISTARIL Take 50 mg by mouth 2 (two) times daily.   Magnesium Oxide -Mg Supplement 250 MG Tabs Take 250 mg by mouth every morning.   Magnesium Oxide 250 MG Tabs Take 250 mg by mouth daily.   Melatonin 5 MG Caps Take 10 mg by mouth  at bedtime.   pyridOXINE 100 MG tablet Commonly known as:  VITAMIN B-6 Take 100 mg by mouth daily.   ranitidine 150 MG tablet Commonly known as:  ZANTAC Take 300 mg by mouth 2 (two) times daily.   sertraline 50 MG tablet Commonly known as:  ZOLOFT Take 50 mg by mouth every morning.   traMADol 50 MG tablet Commonly known as:  ULTRAM Take 50 mg by mouth every 6 (six) hours as needed.   traZODone 100 MG tablet Commonly known as:  DESYREL Take 100 mg by mouth at bedtime.       Allergies:  Allergies  Allergen Reactions  . Celexa [Citalopram] Other (See Comments)    "unknown"  . Cogentin [Benztropine] Other (See Comments)    "unknown"  . Flagyl  [Metronidazole] Other (See Comments)    "unknown"  . Adhesive [Tape] Rash    Family History: Family History  Problem Relation Age of Onset  . Kidney disease Sister   . Kidney failure Sister   . Breast cancer Neg Hx   . Kidney cancer Neg Hx   . Prostate cancer Neg Hx     Social History:  reports that  has never smoked. she has never used smokeless tobacco. She reports that she does not drink alcohol or use drugs.  ROS: UROLOGY Frequent Urination?: Yes Hard to postpone urination?: No Burning/pain with urination?: No Get up at night to urinate?: Yes Leakage of urine?: Yes Urine stream starts and stops?: Yes Trouble starting stream?: No Do you have to strain to urinate?: No Blood in urine?: Yes Urinary tract infection?: No Sexually transmitted disease?: No Injury to kidneys or bladder?: No Painful intercourse?: Yes Weak stream?: Yes Currently pregnant?: No Vaginal bleeding?: No Last menstrual period?: n  Gastrointestinal Nausea?: Yes Vomiting?: No Indigestion/heartburn?: Yes Diarrhea?: No Constipation?: Yes  Constitutional Fever: No Night sweats?: No Weight loss?: No Fatigue?: Yes  Skin Skin rash/lesions?: No Itching?: No  Eyes Blurred vision?: Yes Double vision?: No  Ears/Nose/Throat Sore throat?: No Sinus problems?: No  Hematologic/Lymphatic Swollen glands?: No Easy bruising?: No  Cardiovascular Leg swelling?: No Chest pain?: No  Respiratory Cough?: No Shortness of breath?: No  Endocrine Excessive thirst?: No  Musculoskeletal Back pain?: Yes Joint pain?: Yes  Neurological Headaches?: No Dizziness?: Yes  Psychologic Depression?: Yes Anxiety?: Yes  Physical Exam: BP 125/76   Pulse (!) 116   Ht 4\' 11"  (1.499 m)   Wt 159 lb (72.1 kg)   BMI 32.11 kg/m    Constitutional:  Alert and oriented, No acute distress. HEENT: [place] AT, moist mucus membranes.  Trachea midline, no masses. Cardiovascular: No clubbing, cyanosis, or  edema. Respiratory: Normal respiratory effort, no increased work of breathing. GI: Abdomen is soft, nontender, nondistended, no abdominal masses GU: No CVA tenderness. Skin: No rashes, bruises or suspicious lesions. Lymph: No cervical or inguinal adenopathy. Neurologic: Grossly intact, no focal deficits, moving all 4 extremities. Psychiatric: Normal mood and affect.  Laboratory Data: Lab Results  Component Value Date   WBC 9.1 03/03/2015   HGB 14.0 03/03/2015   HCT 42.7 03/03/2015   MCV 90.7 03/03/2015   PLT 252 03/03/2015    Lab Results  Component Value Date   CREATININE 0.83 11/06/2013    Urinalysis Lab Results  Component Value Date   SPECGRAV 1.010 07/29/2017   PHUR 6.0 07/29/2017   COLORU Yellow 07/29/2017   APPEARANCEUR Clear 07/29/2017   LEUKOCYTESUR Trace (A) 07/29/2017   PROTEINUR Negative 07/29/2017   GLUCOSEU Negative 07/29/2017  KETONESU Negative 07/29/2017   RBCU Trace (A) 07/29/2017   BILIRUBINUR Negative 07/29/2017   UUROB 0.2 07/29/2017   NITRITE Negative 07/29/2017    Lab Results  Component Value Date   LABMICR See below: 07/29/2017   WBCUA 0-5 07/29/2017   RBCUA None seen 07/29/2017   LABEPIT 0-10 07/29/2017   BACTERIA None seen 07/29/2017       Assessment & Plan:  72 year old female with storage related voiding symptoms. She does not have significant microhematuria. Based on American Urological Association practice guidelines asymptomatic microhematuria [facility]) is defined as three or greater red blood cells per high powered field on a properly collected urinary specimen in the absence of an obvious benign cause. A positive dipstick does not define AMH, and evaluation should be based solely on findings from microscopic examination of urinary sediment and not on a dipstick reading.  Since she does have storage related voiding symptoms and history of pyuria I have recommended scheduling cystoscopy.  Her voiding symptoms are bothersome enough that  she did this desire a trial of medical management and Rx Vesicare was sent to her pharmacy.  1.  Dipstick hematuria  - Urinalysis, Complete  2. Urinary frequency   3. Urinary urgency   4. Urge incontinence   5. Renal cyst     Abbie Sons, MD  [facility] [address], [place], [place] [address] [phone]

## 2017-08-06 ENCOUNTER — Encounter: Payer: Self-pay | Admitting: *Deleted

## 2017-08-07 ENCOUNTER — Encounter
Admission: RE | Disposition: A | Discharge status: Home / Self Care | Payer: Self-pay | Source: Ambulatory Visit | Attending: Unknown Physician Specialty

## 2017-08-07 ENCOUNTER — Ambulatory Visit: Payer: PPO | Admitting: Anesthesiology

## 2017-08-07 ENCOUNTER — Other Ambulatory Visit: Payer: Self-pay

## 2017-08-07 ENCOUNTER — Ambulatory Visit
Admission: RE | Admit: 2017-08-07 | Discharge: 2017-08-07 | Disposition: A | Discharge status: Home / Self Care | Payer: PPO | Source: Ambulatory Visit | Attending: Unknown Physician Specialty | Admitting: Unknown Physician Specialty

## 2017-08-07 ENCOUNTER — Encounter: Payer: Self-pay | Admitting: *Deleted

## 2017-08-07 DIAGNOSIS — D122 Benign neoplasm of ascending colon: Secondary | ICD-10-CM | POA: Diagnosis not present

## 2017-08-07 DIAGNOSIS — K2950 Unspecified chronic gastritis without bleeding: Secondary | ICD-10-CM | POA: Diagnosis not present

## 2017-08-07 DIAGNOSIS — F329 Major depressive disorder, single episode, unspecified: Secondary | ICD-10-CM | POA: Diagnosis not present

## 2017-08-07 DIAGNOSIS — K635 Polyp of colon: Secondary | ICD-10-CM | POA: Insufficient documentation

## 2017-08-07 DIAGNOSIS — Z6831 Body mass index (BMI) 31.0-31.9, adult: Secondary | ICD-10-CM | POA: Diagnosis not present

## 2017-08-07 DIAGNOSIS — K3189 Other diseases of stomach and duodenum: Secondary | ICD-10-CM | POA: Diagnosis not present

## 2017-08-07 DIAGNOSIS — K2970 Gastritis, unspecified, without bleeding: Secondary | ICD-10-CM | POA: Insufficient documentation

## 2017-08-07 DIAGNOSIS — G2581 Restless legs syndrome: Secondary | ICD-10-CM | POA: Diagnosis not present

## 2017-08-07 DIAGNOSIS — Z800 Family history of malignant neoplasm of digestive organs: Secondary | ICD-10-CM | POA: Diagnosis not present

## 2017-08-07 DIAGNOSIS — F419 Anxiety disorder, unspecified: Secondary | ICD-10-CM | POA: Insufficient documentation

## 2017-08-07 DIAGNOSIS — K210 Gastro-esophageal reflux disease with esophagitis: Secondary | ICD-10-CM | POA: Insufficient documentation

## 2017-08-07 DIAGNOSIS — K589 Irritable bowel syndrome without diarrhea: Secondary | ICD-10-CM | POA: Diagnosis not present

## 2017-08-07 DIAGNOSIS — Z1211 Encounter for screening for malignant neoplasm of colon: Secondary | ICD-10-CM | POA: Insufficient documentation

## 2017-08-07 DIAGNOSIS — Z8371 Family history of colonic polyps: Secondary | ICD-10-CM | POA: Insufficient documentation

## 2017-08-07 DIAGNOSIS — K209 Esophagitis, unspecified: Secondary | ICD-10-CM | POA: Diagnosis not present

## 2017-08-07 DIAGNOSIS — K640 First degree hemorrhoids: Secondary | ICD-10-CM | POA: Insufficient documentation

## 2017-08-07 DIAGNOSIS — D125 Benign neoplasm of sigmoid colon: Secondary | ICD-10-CM | POA: Diagnosis not present

## 2017-08-07 DIAGNOSIS — I471 Supraventricular tachycardia: Secondary | ICD-10-CM | POA: Insufficient documentation

## 2017-08-07 DIAGNOSIS — Z79899 Other long term (current) drug therapy: Secondary | ICD-10-CM | POA: Diagnosis not present

## 2017-08-07 DIAGNOSIS — Z86010 Personal history of colonic polyps: Secondary | ICD-10-CM | POA: Insufficient documentation

## 2017-08-07 DIAGNOSIS — K319 Disease of stomach and duodenum, unspecified: Secondary | ICD-10-CM | POA: Diagnosis not present

## 2017-08-07 DIAGNOSIS — M1990 Unspecified osteoarthritis, unspecified site: Secondary | ICD-10-CM | POA: Diagnosis not present

## 2017-08-07 DIAGNOSIS — K648 Other hemorrhoids: Secondary | ICD-10-CM | POA: Diagnosis not present

## 2017-08-07 DIAGNOSIS — R110 Nausea: Secondary | ICD-10-CM | POA: Diagnosis not present

## 2017-08-07 DIAGNOSIS — F418 Other specified anxiety disorders: Secondary | ICD-10-CM | POA: Diagnosis not present

## 2017-08-07 HISTORY — DX: Other specified postprocedural states: Z98.890

## 2017-08-07 HISTORY — DX: Ventricular premature depolarization: I49.3

## 2017-08-07 HISTORY — DX: Supraventricular tachycardia, unspecified: I47.10

## 2017-08-07 HISTORY — PX: ESOPHAGOGASTRODUODENOSCOPY (EGD) WITH PROPOFOL: SHX5813

## 2017-08-07 HISTORY — DX: Nausea: R11.0

## 2017-08-07 HISTORY — PX: COLONOSCOPY WITH PROPOFOL: SHX5780

## 2017-08-07 HISTORY — DX: Polyp of colon: K63.5

## 2017-08-07 HISTORY — DX: Epigastric pain: R10.13

## 2017-08-07 HISTORY — DX: Morbid (severe) obesity due to excess calories: E66.01

## 2017-08-07 HISTORY — DX: Supraventricular tachycardia: I47.1

## 2017-08-07 HISTORY — DX: Restless legs syndrome: G25.81

## 2017-08-07 SURGERY — COLONOSCOPY WITH PROPOFOL
Anesthesia: General

## 2017-08-07 MED ORDER — SODIUM CHLORIDE 0.9 % IV SOLN: INTRAVENOUS | Status: DC

## 2017-08-07 MED ORDER — LIDOCAINE 2% (20 MG/ML) 5 ML SYRINGE
INTRAMUSCULAR | Status: DC | PRN
  Administered 2017-08-07: 30 mg via INTRAVENOUS

## 2017-08-07 MED ORDER — FENTANYL CITRATE (PF) 100 MCG/2ML IJ SOLN
INTRAMUSCULAR | Status: DC | PRN
  Administered 2017-08-07 (×2): 50 ug via INTRAVENOUS

## 2017-08-07 MED ORDER — PHENYLEPHRINE HCL 10 MG/ML IJ SOLN
INTRAMUSCULAR | Status: DC | PRN
  Administered 2017-08-07: 100 ug via INTRAVENOUS

## 2017-08-07 MED ORDER — PROPOFOL 500 MG/50ML IV EMUL
INTRAVENOUS | Status: AC
  Filled 2017-08-07: qty 50

## 2017-08-07 MED ORDER — PROPOFOL 500 MG/50ML IV EMUL
INTRAVENOUS | Status: DC | PRN
  Administered 2017-08-07: 160 ug/kg/min via INTRAVENOUS

## 2017-08-07 MED ORDER — FENTANYL CITRATE (PF) 100 MCG/2ML IJ SOLN
INTRAMUSCULAR | Status: AC
  Filled 2017-08-07: qty 2

## 2017-08-07 MED ORDER — PROPOFOL 10 MG/ML IV BOLUS
INTRAVENOUS | Status: DC | PRN
  Administered 2017-08-07: 100 mg via INTRAVENOUS

## 2017-08-07 MED ORDER — SODIUM CHLORIDE 0.9 % IV SOLN
INTRAVENOUS | Status: DC
  Administered 2017-08-07: 11:00:00 via INTRAVENOUS

## 2017-08-07 NOTE — Op Note (Signed)
[facility] Gastroenterology Patient Name: Joanna Mendoza Procedure Date: 08/07/2017 11:26 AM MRN: [id number] Account #: [id number] Date of Birth: Apr 13, 1946 Admit Type: Outpatient Age: 72 Room: [facility] ENDO ROOM 3 Gender: Female Note Status: Finalized Procedure:            Upper GI endoscopy Indications:          Heartburn, Nausea, follow up duodenal sessile                        neuroendocrine tumor previously destroyed. Providers:            Manya Silvas, MD Referring MD:         Ramonita Lab, MD (Referring MD) Medicines:            Propofol per Anesthesia Complications:        No immediate complications. Procedure:            Pre-Anesthesia Assessment:                       - After reviewing the risks and benefits, the patient                        was deemed in satisfactory condition to undergo the                        procedure.                       After obtaining informed consent, the endoscope was                        passed under direct vision. Throughout the procedure,                        the patient's blood pressure, pulse, and oxygen                        saturations were monitored continuously. The Endoscope                        was introduced through the mouth, and advanced to the                        second part of duodenum. The upper GI endoscopy was                        accomplished without difficulty. The patient tolerated                        the procedure well. Findings:      LA Grade A (one or more mucosal breaks less than 5 mm, not extending       between tops of 2 mucosal folds) esophagitis with no bleeding was found       38 cm from the incisors.      Diffuse and patchy mild inflammation characterized by erythema and       granularity was found in the gastric antrum. Biopsies were taken with a       cold forceps for histology. Biopsies were taken with a cold forceps for       Helicobacter pylori testing.  The  examined duodenum was normal. Impression:           - LA Grade A reflux esophagitis.                       - Gastritis. Biopsied.                       - Normal examined duodenum. Recommendation:       - Await pathology results. Manya Silvas, MD 08/07/2017 11:55:56 AM This report has been signed electronically. Number of Addenda: 0 Note Initiated On: 08/07/2017 11:26 AM      [facility]

## 2017-08-07 NOTE — Anesthesia Preprocedure Evaluation (Signed)
Anesthesia Evaluation  Patient identified by MRN, date of birth, ID band Patient awake    Reviewed: Allergy & Precautions, H&P , NPO status , Patient's Chart, lab work & pertinent test results  History of Anesthesia Complications Negative for: history of anesthetic complications  Airway Mallampati: III  TM Distance: <3 FB Neck ROM: limited    Dental  (+) Chipped, Poor Dentition   Pulmonary neg pulmonary ROS, neg shortness of breath,           Cardiovascular Exercise Tolerance: Good + dysrhythmias (-) Valvular Problems/Murmurs     Neuro/Psych PSYCHIATRIC DISORDERS Anxiety Depression negative neurological ROS     GI/Hepatic Neg liver ROS, GERD  Controlled,  Endo/Other  negative endocrine ROS  Renal/GU Renal disease  negative genitourinary   Musculoskeletal   Abdominal   Peds  Hematology negative hematology ROS (+)   Anesthesia Other Findings Past Medical History: No date: Anxiety No date: Anxiety No date: Arthritis No date: Colon polyps No date: Depression No date: Epigastric pain No date: Fatty liver No date: Frequent PVCs No date: GERD (gastroesophageal reflux disease) No date: IBS (irritable bowel syndrome) No date: Morbid obesity ([place]) No date: Nausea No date: PSVT (paroxysmal supraventricular tachycardia) (HCC) No date: Restless legs No date: Status post rotator cuff repair  Past Surgical History: No date: ABDOMINAL HYSTERECTOMY No date: benign; Left     Comment:  breast mass removed 2002: BREAST BIOPSY; Left     Comment:  neg 2002: BREAST SURGERY; Left     Comment:  Breast Biopsy 2011: CHOLECYSTECTOMY No date: COLONOSCOPY WITH ESOPHAGOGASTRODUODENOSCOPY (EGD) No date: ddd 2008: KNEE ARTHROSCOPY; Left No date: KNEE ARTHROSCOPY No date: LAPAROSCOPIC BILATERAL SALPINGO OOPHERECTOMY 04/06/2015: SHOULDER ACROMIOPLASTY; Right     Comment:  Procedure: SUBACROMIAL DECOMPRESSION, ROTATOR CUFF          REPAIR;  Surgeon: Dereck Leep, MD;  Location: [facility]               ORS;  Service: Orthopedics;  Laterality: Right;  BMI    Body Mass Index:  31.31 kg/m      Reproductive/Obstetrics negative OB ROS                             Anesthesia Physical Anesthesia Plan  ASA: III  Anesthesia Plan: General   Post-op Pain Management:    Induction: Intravenous  PONV Risk Score and Plan: Propofol infusion  Airway Management Planned: Natural Airway and Nasal Cannula  Additional Equipment:   Intra-op Plan:   Post-operative Plan:   Informed Consent: I have reviewed the patients History and Physical, chart, labs and discussed the procedure including the risks, benefits and alternatives for the proposed anesthesia with the patient or authorized representative who has indicated his/her understanding and acceptance.   Dental Advisory Given  Plan Discussed with: Anesthesiologist, CRNA and Surgeon  Anesthesia Plan Comments: (Patient consented for risks of anesthesia including but not limited to:  - adverse reactions to medications - risk of intubation if required - damage to teeth, lips or other oral mucosa - sore throat or hoarseness - Damage to heart, brain, lungs or loss of life  Patient voiced understanding.)        Anesthesia Quick Evaluation

## 2017-08-07 NOTE — Anesthesia Post-op Follow-up Note (Signed)
Anesthesia QCDR form completed.        

## 2017-08-07 NOTE — Op Note (Signed)
[facility] Gastroenterology Patient Name: Joanna Mendoza Procedure Date: 08/07/2017 11:26 AM MRN: [id number] Account #: [id number] Date of Birth: 29-May-1946 Admit Type: Outpatient Age: 72 Room: [facility] ENDO ROOM 3 Gender: Female Note Status: Finalized Procedure:            Colonoscopy Indications:          High risk colon cancer surveillance: Personal history                        of colonic polyps Providers:            Manya Silvas, MD Medicines:            Propofol per Anesthesia Complications:        No immediate complications. Procedure:            Pre-Anesthesia Assessment:                       - After reviewing the risks and benefits, the patient                        was deemed in satisfactory condition to undergo the                        procedure.                       After obtaining informed consent, the colonoscope was                        passed under direct vision. Throughout the procedure,                        the patient's blood pressure, pulse, and oxygen                        saturations were monitored continuously. The                        Colonoscope was introduced through the anus and                        advanced to the the cecum, identified by appendiceal                        orifice and ileocecal valve. The colonoscopy was                        somewhat difficult due to a tortuous colon. Successful                        completion of the procedure was aided by applying                        abdominal pressure. The patient tolerated the procedure                        well. The quality of the bowel preparation was good. Findings:      Two sessile polyps were found in the ascending colon. The polyps were       small in size. These polyps were removed with  a jumbo cold forceps.       Resection and retrieval were complete.      Two sessile polyps were found in the sigmoid colon. The polyps were       diminutive in size.  These polyps were removed with a jumbo cold forceps.       Resection and retrieval were complete.      Internal hemorrhoids were found during endoscopy. The hemorrhoids were       small and Grade I (internal hemorrhoids that do not prolapse).      Internal hemorrhoids were found during endoscopy. The hemorrhoids were       small and Grade I (internal hemorrhoids that do not prolapse).      The exam was otherwise without abnormality. Impression:           - Two small polyps in the ascending colon, removed with                        a jumbo cold forceps. Resected and retrieved.                       - Two diminutive polyps in the sigmoid colon, removed                        with a jumbo cold forceps. Resected and retrieved.                       - Internal hemorrhoids.                       - Internal hemorrhoids.                       - The examination was otherwise normal. Recommendation:       - Await pathology results. Manya Silvas, MD 08/07/2017 12:26:04 PM This report has been signed electronically. Number of Addenda: 0 Note Initiated On: 08/07/2017 11:26 AM Scope Withdrawal Time: 0 hours 12 minutes 39 seconds  Total Procedure Duration: 0 hours 23 minutes 35 seconds       [facility]

## 2017-08-07 NOTE — Anesthesia Postprocedure Evaluation (Signed)
Anesthesia Post Note  Patient: [name]  Procedure(s) Performed: COLONOSCOPY WITH PROPOFOL (N/A ) ESOPHAGOGASTRODUODENOSCOPY (EGD) WITH PROPOFOL (N/A )  Patient location during evaluation: Endoscopy Anesthesia Type: General Level of consciousness: awake and alert Pain management: pain level controlled Vital Signs Assessment: post-procedure vital signs reviewed and stable Respiratory status: spontaneous breathing, nonlabored ventilation, respiratory function stable and patient connected to nasal cannula oxygen Cardiovascular status: blood pressure returned to baseline and stable Postop Assessment: no apparent nausea or vomiting Anesthetic complications: no     Last Vitals:  Vitals:   08/07/17 1249 08/07/17 1251  BP: (!) 107/54 (!) 107/54  Pulse: 95 95  Resp: 12 14  Temp:    SpO2: 99% 100%    Last Pain:  Vitals:   08/07/17 1228  TempSrc: Tympanic                 Precious Haws Piscitello

## 2017-08-07 NOTE — H&P (Signed)
Primary Care Physician:  Adin Hector, MD Primary Gastroenterologist:  Dr. Vira Agar  Pre-Procedure History & Physical: HPI:  Joanna Mendoza is a 72 y.o. female is here for an endoscopy and colonoscopy.  Done for PH colon polyps and FH  Colon cancer in her father.  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   Past Medical History:  Diagnosis Date  . Anxiety   . Anxiety   . Arthritis   . Colon polyps   . Depression   . Epigastric pain   . Fatty liver   . Frequent PVCs   . GERD (gastroesophageal reflux disease)   . IBS (irritable bowel syndrome)   . Morbid obesity ([place])   . Nausea   . PSVT (paroxysmal supraventricular tachycardia) ([place])   . Restless legs   . Status post rotator cuff repair     Past Surgical History:  Procedure Laterality Date  . ABDOMINAL HYSTERECTOMY    . benign Left    breast mass removed  . BREAST BIOPSY Left 2002   neg  . BREAST SURGERY Left 2002   Breast Biopsy  . CHOLECYSTECTOMY  2011  . COLONOSCOPY WITH ESOPHAGOGASTRODUODENOSCOPY (EGD)    . ddd    . KNEE ARTHROSCOPY Left 2008  . KNEE ARTHROSCOPY    . LAPAROSCOPIC BILATERAL SALPINGO OOPHERECTOMY    . SHOULDER ACROMIOPLASTY Right 04/06/2015   Procedure: SUBACROMIAL DECOMPRESSION, ROTATOR CUFF REPAIR;  Surgeon: Dereck Leep, MD;  Location: [facility] ORS;  Service: Orthopedics;  Laterality: Right;    Prior to Admission medications   Medication Sig Start Date End Date Taking? Authorizing Provider  calcium carbonate (OS-CAL) 600 MG TABS tablet Take 600 mg by mouth daily.   Yes [provider]  cholecalciferol (VITAMIN D) 1000 UNITS tablet Take 1,000 Units by mouth daily.   Yes [provider]  Chromium 500 MCG TABS Take 500 mcg by mouth daily.   Yes [provider]  citalopram (CELEXA) 20 MG tablet Take 20 mg by mouth daily. 07/29/17  Yes [provider]  gabapentin (NEURONTIN) 100 MG capsule Take 100 mg by mouth 2 (two) times daily. 07/04/17  Yes [provider]  hydrOXYzine (ATARAX/VISTARIL) 25 MG tablet Take 25 mg by mouth 3 (three) times daily as needed.   Yes [provider]  Magnesium Oxide -Mg Supplement 250 MG TABS Take 250 mg by mouth every morning.    Yes [provider]  Magnesium Oxide 250 MG TABS Take 250 mg by mouth daily.   Yes [provider]  Melatonin 5 MG CAPS Take 10 mg by mouth at bedtime.    Yes [provider]  pyridOXINE (VITAMIN B-6) 100 MG tablet Take 100 mg by mouth daily.   Yes [provider]  traZODone (DESYREL) 100 MG tablet Take 100 mg by mouth at bedtime.   Yes [provider]  DULoxetine (CYMBALTA) 60 MG capsule Take 60 mg by mouth daily. 05/11/17   [provider]  estradiol (CLIMARA - DOSED IN MG/24 HR) 0.1 mg/24hr patch Place 0.1 mg onto the skin once a week.    [provider]  HYDROcodone-acetaminophen (NORCO) 5-325 MG tablet Take 1-2 tablets by mouth every 4 (four) hours as needed for moderate pain. Patient not taking: Reported on 08/07/2017 04/06/15   Dereck Leep, MD  hydrOXYzine (VISTARIL) 50 MG capsule Take 50 mg by mouth 2 (two) times daily. 07/23/17   [provider]  QUEtiapine (SEROQUEL) 50 MG tablet  Take 50 mg by mouth at bedtime.    [provider]  ranitidine (ZANTAC) 150 MG tablet Take 300 mg by mouth 2 (two) times daily.    [provider]  sertraline (ZOLOFT) 50 MG tablet Take 50 mg by mouth every morning.     [provider]  solifenacin (VESICARE) 5 MG tablet Take 1 tablet (5 mg total) by mouth daily. Patient not taking: Reported on 08/07/2017 07/30/17   Abbie Sons, MD  traMADol (ULTRAM) 50 MG tablet Take 50 mg by mouth every 6 (six) hours as needed.    [provider]    Allergies as of 08/01/2017 - Review Complete 07/30/2017  Allergen Reaction Noted  . Celexa [citalopram] Other (See Comments) 03/03/2015  . Cogentin [benztropine] Other (See Comments) 03/03/2015  .  Flagyl [metronidazole] Other (See Comments) 03/03/2015  . Adhesive [tape] Rash 03/03/2015    Family History  Problem Relation Age of Onset  . Kidney disease Sister   . Kidney failure Sister   . Breast cancer Neg Hx   . Kidney cancer Neg Hx   . Prostate cancer Neg Hx     Social History   Socioeconomic History  . Marital status: Married    Spouse name: Not on file  . Number of children: Not on file  . Years of education: Not on file  . Highest education level: Not on file  Social Needs  . Financial resource strain: Not on file  . Food insecurity - worry: Not on file  . Food insecurity - inability: Not on file  . Transportation needs - medical: Not on file  . Transportation needs - non-medical: Not on file  Occupational History  . Not on file  Tobacco Use  . Smoking status: Never Smoker  . Smokeless tobacco: Never Used  Substance and Sexual Activity  . Alcohol use: Yes    Comment: occ.  . Drug use: No  . Sexual activity: Not on file  Other Topics Concern  . Not on file  Social History Narrative  . Not on file    Review of Systems: See HPI, otherwise negative ROS  Physical Exam: BP (!) 146/83   Pulse (!) 116   Temp (!) 97.2 F (36.2 C) (Tympanic)   Resp 18   Ht 4\' 11"  (1.499 m)   Wt 70.3 kg (155 lb)   SpO2 99%   BMI 31.31 kg/m  General:   Alert,  pleasant and cooperative in NAD Head:  Normocephalic and atraumatic. Neck:  Supple; no masses or thyromegaly. Lungs:  Clear throughout to auscultation.    Heart:  Regular rate and rhythm. Abdomen:  Soft, nontender and nondistended. Normal bowel sounds, without guarding, and without rebound.   Neurologic:  Alert and  oriented x4;  grossly normal neurologically.  Impression/Plan: Joanna Mendoza is here for an endoscopy and colonoscopy to be performed for [facility] colon polyps and FH colon cancer in father and follow up duodenal neuroendocrine tumors.  Risks, benefits, limitations, and alternatives regarding  endoscopy  and colonoscopy have been reviewed with the patient.  Questions have been answered.  All parties agreeable.   Gaylyn Cheers, MD  08/07/2017, 11:33 AM

## 2017-08-07 NOTE — Transfer of Care (Signed)
Immediate Anesthesia Transfer of Care Note  Patient: Joanna Mendoza  Procedure(s) Performed: COLONOSCOPY WITH PROPOFOL (N/A ) ESOPHAGOGASTRODUODENOSCOPY (EGD) WITH PROPOFOL (N/A )  Patient Location: PACU and Endoscopy Unit  Anesthesia Type:General  Level of Consciousness: awake and sedated  Airway & Oxygen Therapy: Patient Spontanous Breathing and Patient connected to nasal cannula oxygen  Post-op Assessment: Report given to RN and Post -op Vital signs reviewed and stable  Post vital signs: Reviewed and stable  Last Vitals:  Vitals:   08/07/17 1107 08/07/17 1228  BP: (!) 146/83   Pulse: (!) 116 (!) (P) 115  Resp: 18 (P) 18  Temp: (!) 36.2 C (!) (P) 36.1 C  SpO2: 99%     Last Pain:  Vitals:   08/07/17 1228  TempSrc: (P) Tympanic      Patients Stated Pain Goal: 6 (28/00/34 9179)  Complications: No apparent anesthesia complications

## 2017-08-08 ENCOUNTER — Encounter: Payer: Self-pay | Admitting: Unknown Physician Specialty

## 2017-08-09 LAB — SURGICAL PATHOLOGY

## 2017-08-12 ENCOUNTER — Encounter: Payer: Self-pay | Admitting: Urology

## 2017-08-12 ENCOUNTER — Ambulatory Visit: Payer: PPO | Admitting: Urology

## 2017-08-12 VITALS — BP 139/81 | HR 118 | Ht <= 58 in | Wt 159.0 lb

## 2017-08-12 DIAGNOSIS — R319 Hematuria, unspecified: Secondary | ICD-10-CM | POA: Diagnosis not present

## 2017-08-12 DIAGNOSIS — R399 Unspecified symptoms and signs involving the genitourinary system: Secondary | ICD-10-CM

## 2017-08-12 LAB — MICROSCOPIC EXAMINATION
EPITHELIAL CELLS (NON RENAL): NONE SEEN /HPF (ref 0–10)
RBC, UA: NONE SEEN /hpf (ref 0–?)

## 2017-08-12 LAB — URINALYSIS, COMPLETE
BILIRUBIN UA: NEGATIVE
Glucose, UA: NEGATIVE
LEUKOCYTES UA: NEGATIVE
Nitrite, UA: NEGATIVE
PH UA: 5.5 (ref 5.0–7.5)
PROTEIN UA: NEGATIVE
RBC UA: NEGATIVE
Specific Gravity, UA: 1.025 (ref 1.005–1.030)
Urobilinogen, Ur: 0.2 mg/dL (ref 0.2–1.0)

## 2017-08-12 NOTE — Progress Notes (Signed)
   08/12/17   HPI: Was referred for microhematuria however did not have significant hematuria on microscopy.  She did have storage related voiding symptoms and was started on Vesicare.  She did not get this medication due to cost.  Blood pressure 139/81, pulse (!) 118, height 4\' 7"  (1.397 m), weight 159 lb (72.1 kg). NED. A&Ox3.   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  Lidocaine jelly was administered per urethral meatus.    Preoperative abx where received prior to procedure.    Procedure: - Flexible cystoscope introduced, without any difficulty.   - Thorough search of the bladder revealed:    normal urethral meatus    normal urothelium    no stones    no ulcers     no tumors    no urethral polyps    Scattered cellules  - Ureteral orifices were normal in position and appearance.  Post-Procedure: - Patient tolerated the procedure well  Assessment/ Plan:  No significant abnormalities on cystoscopy.  She states her symptoms are intermittent and will give a trial of immediate release oxybutynin to take as needed.  Follow-up 6 months   Abbie Sons, MD

## 2017-08-19 ENCOUNTER — Encounter: Payer: Self-pay | Admitting: Urology

## 2017-08-19 MED ORDER — OXYBUTYNIN CHLORIDE 5 MG PO TABS: 5.0000 mg | ORAL_TABLET | Freq: Three times a day (TID) | ORAL | 0 refills | Status: DC | PRN

## 2017-09-11 DIAGNOSIS — F1021 Alcohol dependence, in remission: Secondary | ICD-10-CM | POA: Diagnosis not present

## 2017-09-11 DIAGNOSIS — F39 Unspecified mood [affective] disorder: Secondary | ICD-10-CM | POA: Diagnosis not present

## 2017-09-11 DIAGNOSIS — F4310 Post-traumatic stress disorder, unspecified: Secondary | ICD-10-CM | POA: Diagnosis not present

## 2017-09-25 DIAGNOSIS — F1021 Alcohol dependence, in remission: Secondary | ICD-10-CM | POA: Diagnosis not present

## 2017-09-25 DIAGNOSIS — F4310 Post-traumatic stress disorder, unspecified: Secondary | ICD-10-CM | POA: Diagnosis not present

## 2017-09-25 DIAGNOSIS — F39 Unspecified mood [affective] disorder: Secondary | ICD-10-CM | POA: Diagnosis not present

## 2017-10-09 DIAGNOSIS — F3342 Major depressive disorder, recurrent, in full remission: Secondary | ICD-10-CM | POA: Diagnosis not present

## 2017-10-09 DIAGNOSIS — K760 Fatty (change of) liver, not elsewhere classified: Secondary | ICD-10-CM | POA: Diagnosis not present

## 2017-10-09 DIAGNOSIS — I471 Supraventricular tachycardia: Secondary | ICD-10-CM | POA: Diagnosis not present

## 2017-10-09 DIAGNOSIS — E782 Mixed hyperlipidemia: Secondary | ICD-10-CM | POA: Diagnosis not present

## 2017-10-09 DIAGNOSIS — Z780 Asymptomatic menopausal state: Secondary | ICD-10-CM | POA: Diagnosis not present

## 2017-10-09 DIAGNOSIS — M47816 Spondylosis without myelopathy or radiculopathy, lumbar region: Secondary | ICD-10-CM | POA: Diagnosis not present

## 2017-10-09 DIAGNOSIS — M5136 Other intervertebral disc degeneration, lumbar region: Secondary | ICD-10-CM | POA: Diagnosis not present

## 2017-10-09 DIAGNOSIS — I493 Ventricular premature depolarization: Secondary | ICD-10-CM | POA: Diagnosis not present

## 2017-10-10 DIAGNOSIS — F4310 Post-traumatic stress disorder, unspecified: Secondary | ICD-10-CM | POA: Diagnosis not present

## 2017-10-10 DIAGNOSIS — F1021 Alcohol dependence, in remission: Secondary | ICD-10-CM | POA: Diagnosis not present

## 2017-10-10 DIAGNOSIS — F39 Unspecified mood [affective] disorder: Secondary | ICD-10-CM | POA: Diagnosis not present

## 2017-10-16 DIAGNOSIS — F1021 Alcohol dependence, in remission: Secondary | ICD-10-CM | POA: Diagnosis not present

## 2017-10-16 DIAGNOSIS — F39 Unspecified mood [affective] disorder: Secondary | ICD-10-CM | POA: Diagnosis not present

## 2017-10-16 DIAGNOSIS — F4310 Post-traumatic stress disorder, unspecified: Secondary | ICD-10-CM | POA: Diagnosis not present

## 2017-10-21 DIAGNOSIS — Z780 Asymptomatic menopausal state: Secondary | ICD-10-CM | POA: Diagnosis not present

## 2017-11-08 DIAGNOSIS — F4310 Post-traumatic stress disorder, unspecified: Secondary | ICD-10-CM | POA: Diagnosis not present

## 2017-11-08 DIAGNOSIS — F39 Unspecified mood [affective] disorder: Secondary | ICD-10-CM | POA: Diagnosis not present

## 2017-11-08 DIAGNOSIS — F1021 Alcohol dependence, in remission: Secondary | ICD-10-CM | POA: Diagnosis not present

## 2017-11-25 DIAGNOSIS — M1712 Unilateral primary osteoarthritis, left knee: Secondary | ICD-10-CM | POA: Diagnosis not present

## 2017-12-16 DIAGNOSIS — M1712 Unilateral primary osteoarthritis, left knee: Secondary | ICD-10-CM | POA: Diagnosis not present

## 2017-12-19 DIAGNOSIS — F4310 Post-traumatic stress disorder, unspecified: Secondary | ICD-10-CM | POA: Diagnosis not present

## 2017-12-19 DIAGNOSIS — F39 Unspecified mood [affective] disorder: Secondary | ICD-10-CM | POA: Diagnosis not present

## 2017-12-19 DIAGNOSIS — F1021 Alcohol dependence, in remission: Secondary | ICD-10-CM | POA: Diagnosis not present

## 2017-12-23 ENCOUNTER — Other Ambulatory Visit: Payer: Self-pay | Admitting: Internal Medicine

## 2017-12-23 DIAGNOSIS — Z1231 Encounter for screening mammogram for malignant neoplasm of breast: Secondary | ICD-10-CM

## 2018-01-07 DIAGNOSIS — M47816 Spondylosis without myelopathy or radiculopathy, lumbar region: Secondary | ICD-10-CM | POA: Diagnosis not present

## 2018-01-07 DIAGNOSIS — E782 Mixed hyperlipidemia: Secondary | ICD-10-CM | POA: Diagnosis not present

## 2018-01-07 DIAGNOSIS — M5136 Other intervertebral disc degeneration, lumbar region: Secondary | ICD-10-CM | POA: Diagnosis not present

## 2018-01-07 DIAGNOSIS — I471 Supraventricular tachycardia: Secondary | ICD-10-CM | POA: Diagnosis not present

## 2018-01-13 DIAGNOSIS — F5105 Insomnia due to other mental disorder: Secondary | ICD-10-CM | POA: Diagnosis not present

## 2018-01-13 DIAGNOSIS — F4310 Post-traumatic stress disorder, unspecified: Secondary | ICD-10-CM | POA: Diagnosis not present

## 2018-01-13 DIAGNOSIS — F39 Unspecified mood [affective] disorder: Secondary | ICD-10-CM | POA: Diagnosis not present

## 2018-01-20 ENCOUNTER — Ambulatory Visit
Admission: RE | Admit: 2018-01-20 | Discharge: 2018-01-20 | Disposition: A | Discharge status: Home / Self Care | Payer: PPO | Source: Ambulatory Visit | Attending: Internal Medicine | Admitting: Internal Medicine

## 2018-01-20 DIAGNOSIS — Z1231 Encounter for screening mammogram for malignant neoplasm of breast: Secondary | ICD-10-CM | POA: Insufficient documentation

## 2018-02-27 DIAGNOSIS — R350 Frequency of micturition: Secondary | ICD-10-CM | POA: Diagnosis not present

## 2018-02-27 DIAGNOSIS — R300 Dysuria: Secondary | ICD-10-CM | POA: Diagnosis not present

## 2018-04-16 DIAGNOSIS — F4310 Post-traumatic stress disorder, unspecified: Secondary | ICD-10-CM | POA: Diagnosis not present

## 2018-04-16 DIAGNOSIS — F39 Unspecified mood [affective] disorder: Secondary | ICD-10-CM | POA: Diagnosis not present

## 2018-04-16 DIAGNOSIS — F1021 Alcohol dependence, in remission: Secondary | ICD-10-CM | POA: Diagnosis not present

## 2018-04-16 DIAGNOSIS — F5105 Insomnia due to other mental disorder: Secondary | ICD-10-CM | POA: Diagnosis not present

## 2018-05-05 DIAGNOSIS — G8929 Other chronic pain: Secondary | ICD-10-CM | POA: Diagnosis not present

## 2018-05-05 DIAGNOSIS — E782 Mixed hyperlipidemia: Secondary | ICD-10-CM | POA: Diagnosis not present

## 2018-05-05 DIAGNOSIS — R1011 Right upper quadrant pain: Secondary | ICD-10-CM | POA: Diagnosis not present

## 2018-05-09 DIAGNOSIS — M5136 Other intervertebral disc degeneration, lumbar region: Secondary | ICD-10-CM | POA: Diagnosis not present

## 2018-05-09 DIAGNOSIS — G4733 Obstructive sleep apnea (adult) (pediatric): Secondary | ICD-10-CM | POA: Diagnosis not present

## 2018-05-09 DIAGNOSIS — F3342 Major depressive disorder, recurrent, in full remission: Secondary | ICD-10-CM | POA: Diagnosis not present

## 2018-05-09 DIAGNOSIS — I471 Supraventricular tachycardia: Secondary | ICD-10-CM | POA: Diagnosis not present

## 2018-05-09 DIAGNOSIS — M8589 Other specified disorders of bone density and structure, multiple sites: Secondary | ICD-10-CM | POA: Diagnosis not present

## 2018-05-09 DIAGNOSIS — Z0000 Encounter for general adult medical examination without abnormal findings: Secondary | ICD-10-CM | POA: Diagnosis not present

## 2018-05-09 DIAGNOSIS — E782 Mixed hyperlipidemia: Secondary | ICD-10-CM | POA: Diagnosis not present

## 2018-06-13 DIAGNOSIS — G4709 Other insomnia: Secondary | ICD-10-CM | POA: Diagnosis not present

## 2018-06-13 DIAGNOSIS — I471 Supraventricular tachycardia: Secondary | ICD-10-CM | POA: Diagnosis not present

## 2018-06-13 DIAGNOSIS — F3342 Major depressive disorder, recurrent, in full remission: Secondary | ICD-10-CM | POA: Diagnosis not present

## 2018-06-13 DIAGNOSIS — K760 Fatty (change of) liver, not elsewhere classified: Secondary | ICD-10-CM | POA: Diagnosis not present

## 2018-06-13 DIAGNOSIS — M5136 Other intervertebral disc degeneration, lumbar region: Secondary | ICD-10-CM | POA: Diagnosis not present

## 2018-06-13 DIAGNOSIS — E782 Mixed hyperlipidemia: Secondary | ICD-10-CM | POA: Diagnosis not present

## 2018-06-13 DIAGNOSIS — R739 Hyperglycemia, unspecified: Secondary | ICD-10-CM | POA: Diagnosis not present

## 2018-07-02 DIAGNOSIS — G4733 Obstructive sleep apnea (adult) (pediatric): Secondary | ICD-10-CM | POA: Diagnosis not present

## 2018-07-08 ENCOUNTER — Encounter: Payer: PPO | Attending: Internal Medicine | Admitting: Dietician

## 2018-07-08 ENCOUNTER — Encounter: Payer: Self-pay | Admitting: Dietician

## 2018-07-08 VITALS — Ht <= 58 in | Wt 164.3 lb

## 2018-07-08 DIAGNOSIS — R739 Hyperglycemia, unspecified: Secondary | ICD-10-CM | POA: Diagnosis not present

## 2018-07-08 DIAGNOSIS — E785 Hyperlipidemia, unspecified: Secondary | ICD-10-CM | POA: Diagnosis not present

## 2018-07-08 NOTE — Patient Instructions (Addendum)
   Have a schedule to allow a meal or snack every 4-5 hours during the day (the time you are awake).   Plan for a snack or small meal around 10pm, such as a sandwich or 1/2 sandwich, yogurt + fruit and nuts, small sweet potato and a few nuts for protein.   Think about easy, enjoyable activities to increase movement during the day, this might help with better sleep at night and better energy during the day.   Consider keeping a journal to record good things in the day, whether recording healthy meals or small amounts of activity, etc.

## 2018-07-08 NOTE — Progress Notes (Signed)
Medical Nutrition Therapy: Visit start time: 0938  end time: 1200  Assessment:  Diagnosis: hyperlipidemia, hyperglycemia Past medical history: chronic depression, insomnia Psychosocial issues/ stress concerns: depression  Preferred learning method:  . Visual   Current weight: 164.3lbs Height: 4'8" Medications, supplements: reconciled list in medical record  Progress and evaluation: Patient reports gradual increase in weight over the past 2 years as physical activity has declined due to depression and knee pain. Has not been on any cholesterol medication yet. Was testing blood sugars for a time, with results normal and at times low. She states she is often unable to sleep until early morning hours. Wakes up late morning/ early afternoon. She feels little motivation to do anything during the day, including preparing meals and exercising. She wants to lose weight, her goal is 125-130lbs. Patient also reports some GI distress when eating large portions of wheat products.  Physical activity: none  Dietary Intake:  Usual eating pattern includes 1-2 meals and 2+ snacks per day. Dining out frequency: 0-1 meals per week.  Breakfast: 12-2pm when waking up-- eggs, fruit, coffee sometimes none, or coffee and fruit Snack: fruit Lunch: none Snack: none Supper: 6pm -- not much cooking recently; usually frozen meal, meatloaf; loves sweet potatoes + veg Snack: late night -- pretzels (large); cheese and crackers; tangerines, grapes; 1 or more snacks before going to sleep Beverages: water, coffee, sugar free drinks ie gatorade; occasionally some juice drinks  Nutrition Care Education: Topics covered: weight control, hyperlipidemia, hyperglycemia Basic nutrition: basic food groups, appropriate nutrient balance, appropriate meal and snack schedule, general nutrition guidelines    Weight control: benefits of weight control, basic meal planning using plate method with guidance for daily and mealtime intake  of protein (6oz daily), carbohydrate (8-9 servings daily), and fat (5-6 servings daily); encouraged low sugar and low fat food choices. Advanced nutrition:  food label reading for CHO hyperglycemia: appropriate meal and snack schedule, appropriate carb intake and balance, low sugar choices, and healthy carb foods Hyperlipidemia:  target goals for lipids, healthy and unhealthy fats, role of fiber, vegetable and fruit intake, role of activity and weight control Other:  Effects of sleep on weight, blood sugar, and cholesterol; making gradual and small lifestyle changes  Nutritional Diagnosis:  [place]-2.2 Altered nutrition-related laboratory As related to hyperlipidemia and hyperglycemia.  As evidenced by recent BGs of 105, 115, and recent blood lipids of 255 total cholesterol, LDL 150, HDL 47, Triglycerides 286. -3.3 Overweight/obesity As related to inactivity, excess calories, insomnia, depression.  As evidenced by patient with current BMI of 36.8.  Intervention:   Instruction and discussion as noted above.  Patient has made some positive diet changes despite limited motivation.   Patient has regular appointments with psychiatrist to treat depression.  Established nutrition goals with input from patient.   Education Materials given:  . General diet guidelines for Cholesterol-lowering/ Heart health . Plate Planner with food lists . Sample meal pattern/ menus . Snacking handout . Goals/ instructions   Learner/ who was taught:  . Patient   Level of understanding: Marland Kitchen Verbalizes/ demonstrates competency   Demonstrated degree of understanding via:   Teach back Learning barriers: . None  Willingness to learn/ readiness for change: . Eager, change in progress but patient reports limited motivation  Monitoring and Evaluation:  Dietary intake, exercise, blood lipids and blood sugar, and body weight      follow up: 08/12/18

## 2018-07-14 DIAGNOSIS — F4310 Post-traumatic stress disorder, unspecified: Secondary | ICD-10-CM | POA: Diagnosis not present

## 2018-07-14 DIAGNOSIS — F1021 Alcohol dependence, in remission: Secondary | ICD-10-CM | POA: Diagnosis not present

## 2018-07-14 DIAGNOSIS — F5105 Insomnia due to other mental disorder: Secondary | ICD-10-CM | POA: Diagnosis not present

## 2018-07-14 DIAGNOSIS — F39 Unspecified mood [affective] disorder: Secondary | ICD-10-CM | POA: Diagnosis not present

## 2018-08-06 DIAGNOSIS — F39 Unspecified mood [affective] disorder: Secondary | ICD-10-CM | POA: Diagnosis not present

## 2018-08-06 DIAGNOSIS — F1021 Alcohol dependence, in remission: Secondary | ICD-10-CM | POA: Diagnosis not present

## 2018-08-06 DIAGNOSIS — F4310 Post-traumatic stress disorder, unspecified: Secondary | ICD-10-CM | POA: Diagnosis not present

## 2018-08-06 DIAGNOSIS — F5105 Insomnia due to other mental disorder: Secondary | ICD-10-CM | POA: Diagnosis not present

## 2018-08-11 ENCOUNTER — Telehealth: Payer: Self-pay | Admitting: Dietician

## 2018-08-11 NOTE — Telephone Encounter (Signed)
Returned voicemail message from patient to cancel her appointment for tomorrow, 08/12/18. Patient states she is planning to try using [employer] products along with her daughter and does not need to reschedule at this time. Encouraged her to call back if any questions or concerns arise.

## 2018-08-12 ENCOUNTER — Ambulatory Visit: Payer: PPO | Admitting: Dietician

## 2018-08-13 ENCOUNTER — Encounter: Payer: Self-pay | Admitting: Dietician

## 2018-08-13 NOTE — Progress Notes (Signed)
Sent letter for discharge to referring provider as patient does not wish to return for MNT follow up at this time.

## 2018-08-19 DIAGNOSIS — R195 Other fecal abnormalities: Secondary | ICD-10-CM | POA: Diagnosis not present

## 2018-08-19 DIAGNOSIS — R05 Cough: Secondary | ICD-10-CM | POA: Diagnosis not present

## 2018-08-19 DIAGNOSIS — J069 Acute upper respiratory infection, unspecified: Secondary | ICD-10-CM | POA: Diagnosis not present

## 2018-08-26 DIAGNOSIS — F1021 Alcohol dependence, in remission: Secondary | ICD-10-CM | POA: Diagnosis not present

## 2018-08-26 DIAGNOSIS — F4310 Post-traumatic stress disorder, unspecified: Secondary | ICD-10-CM | POA: Diagnosis not present

## 2018-08-26 DIAGNOSIS — F39 Unspecified mood [affective] disorder: Secondary | ICD-10-CM | POA: Diagnosis not present

## 2018-11-04 DIAGNOSIS — F1021 Alcohol dependence, in remission: Secondary | ICD-10-CM | POA: Diagnosis not present

## 2018-11-04 DIAGNOSIS — F39 Unspecified mood [affective] disorder: Secondary | ICD-10-CM | POA: Diagnosis not present

## 2018-11-04 DIAGNOSIS — F4310 Post-traumatic stress disorder, unspecified: Secondary | ICD-10-CM | POA: Diagnosis not present

## 2018-11-04 DIAGNOSIS — F5105 Insomnia due to other mental disorder: Secondary | ICD-10-CM | POA: Diagnosis not present

## 2018-11-14 DIAGNOSIS — M1712 Unilateral primary osteoarthritis, left knee: Secondary | ICD-10-CM | POA: Diagnosis not present

## 2018-11-29 DIAGNOSIS — F39 Unspecified mood [affective] disorder: Secondary | ICD-10-CM | POA: Diagnosis not present

## 2018-11-29 DIAGNOSIS — F5105 Insomnia due to other mental disorder: Secondary | ICD-10-CM | POA: Diagnosis not present

## 2018-11-29 DIAGNOSIS — F4310 Post-traumatic stress disorder, unspecified: Secondary | ICD-10-CM | POA: Diagnosis not present

## 2018-12-04 DIAGNOSIS — K760 Fatty (change of) liver, not elsewhere classified: Secondary | ICD-10-CM | POA: Diagnosis not present

## 2018-12-04 DIAGNOSIS — E782 Mixed hyperlipidemia: Secondary | ICD-10-CM | POA: Diagnosis not present

## 2018-12-04 DIAGNOSIS — R739 Hyperglycemia, unspecified: Secondary | ICD-10-CM | POA: Diagnosis not present

## 2018-12-04 DIAGNOSIS — M5136 Other intervertebral disc degeneration, lumbar region: Secondary | ICD-10-CM | POA: Diagnosis not present

## 2018-12-04 DIAGNOSIS — I471 Supraventricular tachycardia: Secondary | ICD-10-CM | POA: Diagnosis not present

## 2018-12-12 DIAGNOSIS — F3342 Major depressive disorder, recurrent, in full remission: Secondary | ICD-10-CM | POA: Diagnosis not present

## 2018-12-12 DIAGNOSIS — M5136 Other intervertebral disc degeneration, lumbar region: Secondary | ICD-10-CM | POA: Diagnosis not present

## 2018-12-12 DIAGNOSIS — R739 Hyperglycemia, unspecified: Secondary | ICD-10-CM | POA: Diagnosis not present

## 2018-12-12 DIAGNOSIS — M1712 Unilateral primary osteoarthritis, left knee: Secondary | ICD-10-CM | POA: Diagnosis not present

## 2018-12-12 DIAGNOSIS — G8929 Other chronic pain: Secondary | ICD-10-CM | POA: Diagnosis not present

## 2018-12-12 DIAGNOSIS — I493 Ventricular premature depolarization: Secondary | ICD-10-CM | POA: Diagnosis not present

## 2018-12-12 DIAGNOSIS — I471 Supraventricular tachycardia: Secondary | ICD-10-CM | POA: Diagnosis not present

## 2018-12-12 DIAGNOSIS — E782 Mixed hyperlipidemia: Secondary | ICD-10-CM | POA: Diagnosis not present

## 2018-12-12 DIAGNOSIS — R1011 Right upper quadrant pain: Secondary | ICD-10-CM | POA: Diagnosis not present

## 2018-12-16 ENCOUNTER — Other Ambulatory Visit: Payer: Self-pay | Admitting: Internal Medicine

## 2018-12-16 DIAGNOSIS — Z1231 Encounter for screening mammogram for malignant neoplasm of breast: Secondary | ICD-10-CM

## 2019-01-23 ENCOUNTER — Other Ambulatory Visit: Payer: Self-pay

## 2019-01-23 ENCOUNTER — Ambulatory Visit
Admission: RE | Admit: 2019-01-23 | Discharge: 2019-01-23 | Disposition: A | Discharge status: Home / Self Care | Payer: PPO | Source: Ambulatory Visit | Attending: Internal Medicine | Admitting: Internal Medicine

## 2019-01-23 DIAGNOSIS — Z1231 Encounter for screening mammogram for malignant neoplasm of breast: Secondary | ICD-10-CM | POA: Diagnosis not present

## 2019-02-03 DIAGNOSIS — M1712 Unilateral primary osteoarthritis, left knee: Secondary | ICD-10-CM | POA: Diagnosis not present

## 2019-02-05 DIAGNOSIS — Z01411 Encounter for gynecological examination (general) (routine) with abnormal findings: Secondary | ICD-10-CM | POA: Diagnosis not present

## 2019-02-05 DIAGNOSIS — N898 Other specified noninflammatory disorders of vagina: Secondary | ICD-10-CM | POA: Diagnosis not present

## 2019-02-07 ENCOUNTER — Emergency Department: Payer: PPO

## 2019-02-07 ENCOUNTER — Encounter: Payer: Self-pay | Admitting: Emergency Medicine

## 2019-02-07 ENCOUNTER — Other Ambulatory Visit: Payer: Self-pay

## 2019-02-07 ENCOUNTER — Emergency Department
Admission: EM | Admit: 2019-02-07 | Discharge: 2019-02-07 | Disposition: A | Discharge status: Home / Self Care | Payer: PPO | Attending: Emergency Medicine | Admitting: Emergency Medicine

## 2019-02-07 DIAGNOSIS — R42 Dizziness and giddiness: Secondary | ICD-10-CM

## 2019-02-07 DIAGNOSIS — R110 Nausea: Secondary | ICD-10-CM | POA: Diagnosis not present

## 2019-02-07 LAB — BASIC METABOLIC PANEL
Anion gap: 12 (ref 5–15)
BUN: 18 mg/dL (ref 8–23)
CO2: 26 mmol/L (ref 22–32)
Calcium: 9.7 mg/dL (ref 8.9–10.3)
Chloride: 102 mmol/L (ref 98–111)
Creatinine, Ser: 0.82 mg/dL (ref 0.44–1.00)
GFR calc Af Amer: >60 mL/min (ref >60)
GFR calc non Af Amer: >60 mL/min (ref >60)
Glucose, Bld: 107 mg/dL — ABNORMAL HIGH (ref 70–99)
Potassium: 4.4 mmol/L (ref 3.5–5.1)
Sodium: 140 mmol/L (ref 135–145)

## 2019-02-07 LAB — CBC
HCT: 43.3 % (ref 36.0–46.0)
Hemoglobin: 14.2 g/dL (ref 12.0–15.0)
MCH: 28.5 pg (ref 26.0–34.0)
MCHC: 32.8 g/dL (ref 30.0–36.0)
MCV: 86.9 fL (ref 80.0–100.0)
Platelets: 268 10*3/uL (ref 150–400)
RBC: 4.98 MIL/uL (ref 3.87–5.11)
RDW: 14.2 % (ref 11.5–15.5)
WBC: 8.6 10*3/uL (ref 4.0–10.5)
nRBC: 0 % (ref 0.0–0.2)

## 2019-02-07 LAB — URINALYSIS, COMPLETE (UACMP) WITH MICROSCOPIC
Bacteria, UA: NONE SEEN
Bilirubin Urine: NEGATIVE
Glucose, UA: NEGATIVE mg/dL
Hgb urine dipstick: NEGATIVE
Ketones, ur: NEGATIVE mg/dL
Leukocytes,Ua: NEGATIVE
Nitrite: NEGATIVE
Protein, ur: NEGATIVE mg/dL
Specific Gravity, Urine: 1.011 (ref 1.005–1.030)
pH: 6 (ref 5.0–8.0)

## 2019-02-07 MED ORDER — MECLIZINE HCL 25 MG PO TABS
25.0000 mg | ORAL_TABLET | Freq: Once | ORAL | Status: AC
  Administered 2019-02-07: 17:00:00 25 mg via ORAL
  Filled 2019-02-07: qty 1

## 2019-02-07 MED ORDER — SODIUM CHLORIDE 0.9% FLUSH: 3.0000 mL | Freq: Once | INTRAVENOUS | Status: DC

## 2019-02-07 MED ORDER — DIAZEPAM 2 MG PO TABS: 2.0000 mg | ORAL_TABLET | Freq: Two times a day (BID) | ORAL | 0 refills | Status: DC | PRN

## 2019-02-07 MED ORDER — PROMETHAZINE HCL 25 MG/ML IJ SOLN
12.5000 mg | Freq: Once | INTRAMUSCULAR | Status: AC
  Administered 2019-02-07: 12.5 mg via INTRAVENOUS
  Filled 2019-02-07: qty 1

## 2019-02-07 MED ORDER — MECLIZINE HCL 12.5 MG PO TABS: 12.5000 mg | ORAL_TABLET | Freq: Three times a day (TID) | ORAL | 0 refills | Status: DC | PRN

## 2019-02-07 MED ORDER — LORAZEPAM 2 MG/ML IJ SOLN
1.0000 mg | Freq: Once | INTRAMUSCULAR | Status: AC
  Administered 2019-02-07: 17:00:00 1 mg via INTRAVENOUS
  Filled 2019-02-07: qty 1

## 2019-02-07 MED ORDER — AMOXICILLIN-POT CLAVULANATE 875-125 MG PO TABS: 1.0000 | ORAL_TABLET | Freq: Two times a day (BID) | ORAL | 0 refills | Status: AC

## 2019-02-07 NOTE — ED Triage Notes (Signed)
Pt to ED via POV c/o dizziness x 2-3 days. Pt states that she started having nausea last night. Pt states that she has the sensation of the room spinning. Pt states that the dizziness comes and goes depending on what she is doing. Pt is in NAD at this time.

## 2019-02-07 NOTE — ED Notes (Signed)
Transported to MRI

## 2019-02-07 NOTE — Discharge Instructions (Addendum)
The MRI shows you have a sinus infection.  I will give you some Augmentin 1 twice a day with food for that.  You can also try some salt water nasal spray 5 or 6 times a day to help shrink the swelling and let the sinuses drain.  For the vertigo I will give you Valium 1 pill twice a day and Antivert 1 pill 3 times a day.  Both of these can make you a little woozy and lightheaded.  Please be very careful not to fall.  Please do not drive on them.  Please return if the symptoms get any worse.  Also return if you are no better in 2 to 3 days.  If they are getting better but not gone please call [place] [facility].  Dr. Maylene Roes is on call for them.  They will be able to reevaluate you and see if there is anything further we can do.

## 2019-02-07 NOTE — ED Notes (Signed)
Pt taken off bedpan 

## 2019-02-07 NOTE — ED Provider Notes (Addendum)
[facility] Emergency Department Provider Note   ____________________________________________   First MD Initiated Contact with Patient 02/07/19 1420     (approximate)  I have reviewed the triage vital signs and the nursing notes.   HISTORY  Chief Complaint Dizziness    HPI Joanna Mendoza is a 73 y.o. female who reports nausea with vertigo symptoms room spinning especially with head movement.  This been going on for 2 to 3 days.  Seems to be getting worse.  She has no other complaints except for some headache and reporting that when she pushes on the left back of her head the dizziness seems to get worse.  She does not report any shortness of breath or chest pain or         Past Medical History:  Diagnosis Date  . Anxiety   . Anxiety   . Arthritis   . Colon polyps   . Depression   . Epigastric pain   . Fatty liver   . Frequent PVCs   . GERD (gastroesophageal reflux disease)   . IBS (irritable bowel syndrome)   . Morbid obesity ([place])   . Nausea   . PSVT (paroxysmal supraventricular tachycardia) ([place])   . Restless legs   . Status post rotator cuff repair     Patient Active Problem List   Diagnosis Date Noted  . 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 Urge incontinence 07/30/2017  . Urinary urgency 07/30/2017  . Urinary frequency 07/30/2017  . Microhematuria 07/30/2017    Past Surgical History:  Procedure Laterality Date  . ABDOMINAL HYSTERECTOMY    . benign Left    breast mass removed  . BREAST EXCISIONAL BIOPSY Left 2002   neg  . BREAST SURGERY Left 2002   Breast Biopsy  . CHOLECYSTECTOMY  2011  . COLONOSCOPY WITH ESOPHAGOGASTRODUODENOSCOPY (EGD)    . COLONOSCOPY WITH PROPOFOL N/A 08/07/2017   Procedure: COLONOSCOPY WITH PROPOFOL;  Surgeon: Manya Silvas, MD;  Location: [facility] ENDOSCOPY;  Service: Endoscopy;  Laterality: N/A;  . ddd    . ESOPHAGOGASTRODUODENOSCOPY (EGD) WITH PROPOFOL N/A 08/07/2017   Procedure: ESOPHAGOGASTRODUODENOSCOPY  (EGD) WITH PROPOFOL;  Surgeon: Manya Silvas, MD;  Location: [facility] ENDOSCOPY;  Service: Endoscopy;  Laterality: N/A;  . KNEE ARTHROSCOPY Left 2008  . KNEE ARTHROSCOPY    . LAPAROSCOPIC BILATERAL SALPINGO OOPHERECTOMY    . OOPHORECTOMY    . SHOULDER ACROMIOPLASTY Right 04/06/2015   Procedure: SUBACROMIAL DECOMPRESSION, ROTATOR CUFF REPAIR;  Surgeon: Dereck Leep, MD;  Location: [facility] ORS;  Service: Orthopedics;  Laterality: Right;    Prior to Admission medications   Medication Sig Start Date End Date Taking? Authorizing Provider  amoxicillin-clavulanate (AUGMENTIN) 875-125 MG tablet Take 1 tablet by mouth 2 (two) times daily for 10 days. 02/07/19 02/17/19  Nena Polio, MD  calcium carbonate (OS-CAL) 600 MG TABS tablet Take 600 mg by mouth daily.    [provider]  carboxymethylcellulose (REFRESH TEARS) 0.5 % SOLN Apply to eye.    [provider]  cholecalciferol (VITAMIN D) 1000 UNITS tablet Take 1,000 Units by mouth daily.    [provider]  Chromium Picolinate 200 MCG CAPS Take by mouth.    [provider]  citalopram (CELEXA) 20 MG tablet Take 20 mg by mouth daily. 07/29/17   [provider]  diazepam (VALIUM) 2 MG tablet Take 1 tablet (2 mg total) by mouth every 12 (twelve) hours as needed (vertigo). 02/07/19 02/07/20  Nena Polio, MD  estradiol (  CLIMARA - DOSED IN MG/24 HR) 0.1 mg/24hr patch Place 0.1 mg onto the skin once a week.    [provider]  gabapentin (NEURONTIN) 100 MG capsule Take 100 mg by mouth 2 (two) times daily. 07/04/17   [provider]  hydrOXYzine (VISTARIL) 50 MG capsule Take 50 mg by mouth 2 (two) times daily. 07/23/17   [provider]  Magnesium Oxide -Mg Supplement 250 MG TABS Take 250 mg by mouth every morning.     [provider]  meclizine (ANTIVERT) 12.5 MG tablet Take 1 tablet (12.5 mg total) by mouth 3 (three) times daily as needed for dizziness. 02/07/19   Nena Polio, MD   Melatonin 5 MG CAPS Take 10 mg by mouth at bedtime.     [provider]  omeprazole (PRILOSEC) 40 MG capsule TK 1 C PO QD 05/17/18   [provider]  pyridOXINE (VITAMIN B-6) 100 MG tablet Take 100 mg by mouth daily.    [provider]  tolterodine (DETROL LA) 4 MG 24 hr capsule  07/06/18   [provider]  traZODone (DESYREL) 100 MG tablet Take 100 mg by mouth at bedtime.    [provider]    Allergies Celexa [citalopram], Cogentin [benztropine], Flagyl [metronidazole], Hydrocodone, and Adhesive [tape]  Family History  Problem Relation Age of Onset  . Kidney disease Sister   . Kidney failure Sister   . Breast cancer Neg Hx   . Kidney cancer Neg Hx   . Prostate cancer Neg Hx     Social History Social History   Tobacco Use  . Smoking status: Never Smoker  . Smokeless tobacco: Never Used  Substance Use Topics  . Alcohol use: Yes    Comment: occ.  . Drug use: No    Review of Systems  Constitutional: No fever/chills Eyes: No visual changes. ENT: No sore throat. Cardiovascular: Denies chest pain. Respiratory: Denies shortness of breath. Gastrointestinal: No abdominal pain.  No nausea, no vomiting.  No diarrhea.  No constipation. Genitourinary: Negative for dysuria. Musculoskeletal: Negative for back pain. Skin: Negative for rash. Neurological: Negative for focal weakness   ____________________________________________   PHYSICAL EXAM:  VITAL SIGNS: ED Triage Vitals  Enc Vitals Group     BP 02/07/19 1118 (!) 148/80     Pulse Rate 02/07/19 1118 87     Resp 02/07/19 1118 16     Temp 02/07/19 1118 98 F (36.7 C)     Temp Source 02/07/19 1118 Oral     SpO2 02/07/19 1118 98 %     Weight 02/07/19 1119 160 lb (72.6 kg)     Height 02/07/19 1119 4\' 8"  (1.422 m)     Head Circumference --      Peak Flow --      Pain Score 02/07/19 1119 0     Pain Loc --      Pain Edu? --      Excl. in College? --    Constitutional: Alert and  oriented. Well appearing and in no acute distress. Eyes: Conjunctivae are normal. PERRL. EOMI. fundi look normal Head: Atraumatic. Nose: No congestion/rhinnorhea. Mouth/Throat: Mucous membranes are moist.  Oropharynx non-erythematous. Neck: No stridor.   Cardiovascular: Normal rate, regular rhythm. Grossly normal heart sounds.  Good peripheral circulation. Respiratory: Normal respiratory effort.  No retractions. Lungs CTAB. Gastrointestinal: Soft and nontender. No distention. No abdominal bruits. Musculoskeletal: No lower extremity tenderness nor edema.   Neurologic:  Normal speech and language.  Extraocular movements  intact no focal numbness or weakness.  Patient does have nystagmus horizontal on head movement.  Head impulse testing does not show any lag of eye movement Skin:  Skin is warm, dry and intact. No rash noted. Psychiatric: Mood and affect are normal. Speech and behavior are normal.  ____________________________________________   LABS (all labs ordered are listed, but only abnormal results are displayed)  Labs Reviewed  BASIC METABOLIC PANEL - Abnormal; Notable for the following components:      Result Value   Glucose, Bld 107 (*)    All other components within normal limits  URINALYSIS, COMPLETE (UACMP) WITH MICROSCOPIC - Abnormal; Notable for the following components:   Color, Urine YELLOW (*)    APPearance CLEAR (*)    All other components within normal limits  CBC  CBG MONITORING, ED   ____________________________________________  EKG EKG read interpreted by me shows normal sinus rhythm rate of 85 normal axis no acute ST-T changes  ____________________________________________  RADIOLOGY  ED MD interpretation: MRI is negative for any intracranial pathology but shows maxillary sinusitis which is acute.  Official radiology report(s): Mr Brain Wo Contrast  Result Date: 02/07/2019 CLINICAL DATA:  Focal neuro deficit, > 6 hrs, stroke suspected vertigo dizziness  for 2-3 days. New onset of nausea beginning last night. EXAM: MRI HEAD WITHOUT CONTRAST TECHNIQUE: Multiplanar, multiecho pulse sequences of the brain and surrounding structures were obtained without intravenous contrast. COMPARISON:  MR head without contrast 07/10/2013 FINDINGS: Brain: The diffusion-weighted images demonstrate no acute or subacute infarction. Mild periventricular white matter changes are within normal limits for age and stable. The ventricles are of normal size. The internal auditory canals are within normal limits. No significant extraaxial fluid collection is present. Vascular: Flow is present in the major intracranial arteries. Skull and upper cervical spine: The craniocervical junction is normal. Upper cervical spine is within normal limits. Marrow signal is unremarkable. Sinuses/Orbits: A fluid level is present in the left maxillary sinus. Circumferential mucosal thickening is present as well. The paranasal sinuses and mastoid air cells are otherwise clear. The globes and orbits are within normal limits. IMPRESSION: 1. No acute or subacute infarction to explain patient's vertigo or dizziness. 2. Normal MRI of the brain for age. 3. Acute left maxillary sinus disease. Electronically Signed   By: San Morelle M.D.   On: 02/07/2019 17:48    ____________________________________________   PROCEDURES  Procedure(s) performed (including Critical Care):  Procedures   ____________________________________________   INITIAL IMPRESSION / ASSESSMENT AND PLAN / ED COURSE  Patient's MRI shows a sinus infection.  There is nothing in the brain to explain her vertigo which would lead me to believe of course that is peripheral.  I will give her some Antivert and Valium.  Hopefully this will help.  She is really not better now.  But she can walk if she holds onto things.  I will let her go and have her come back if she is worse or has any further problems.     TOSHI WION was  evaluated in Emergency Department on 02/07/2019 for the symptoms described in the history of present illness. She was evaluated in the context of the global COVID-19 pandemic, which necessitated consideration that the patient might be at risk for infection with the SARS-CoV-2 virus that causes COVID-19. Institutional protocols and algorithms that pertain to the evaluation of patients at risk for COVID-19 are in a state of rapid change based on information released by regulatory bodies including the CDC and federal  and state organizations. These policies and algorithms were followed during the patient's care in the ED.         ____________________________________________   FINAL CLINICAL IMPRESSION(S) / ED DIAGNOSES  Final diagnoses:  Vertigo     ED Discharge Orders         Ordered    amoxicillin-clavulanate (AUGMENTIN) 875-125 MG tablet  2 times daily     02/07/19 2023    diazepam (VALIUM) 2 MG tablet  Every 12 hours PRN     02/07/19 2023    meclizine (ANTIVERT) 12.5 MG tablet  3 times daily PRN     02/07/19 2023           Note:  This document was prepared using Dragon voice recognition software and may include unintentional dictation errors.    Nena Polio, MD 02/07/19 2026    Nena Polio, MD 02/07/19 2027

## 2019-02-09 DIAGNOSIS — M1712 Unilateral primary osteoarthritis, left knee: Secondary | ICD-10-CM | POA: Insufficient documentation

## 2019-02-16 DIAGNOSIS — R42 Dizziness and giddiness: Secondary | ICD-10-CM | POA: Diagnosis not present

## 2019-03-05 DIAGNOSIS — M79671 Pain in right foot: Secondary | ICD-10-CM | POA: Diagnosis not present

## 2019-03-05 DIAGNOSIS — M2041 Other hammer toe(s) (acquired), right foot: Secondary | ICD-10-CM | POA: Diagnosis not present

## 2019-03-05 DIAGNOSIS — G5762 Lesion of plantar nerve, left lower limb: Secondary | ICD-10-CM | POA: Diagnosis not present

## 2019-03-06 DIAGNOSIS — M5136 Other intervertebral disc degeneration, lumbar region: Secondary | ICD-10-CM | POA: Diagnosis not present

## 2019-03-06 DIAGNOSIS — R739 Hyperglycemia, unspecified: Secondary | ICD-10-CM | POA: Diagnosis not present

## 2019-03-06 DIAGNOSIS — R1011 Right upper quadrant pain: Secondary | ICD-10-CM | POA: Diagnosis not present

## 2019-03-06 DIAGNOSIS — G8929 Other chronic pain: Secondary | ICD-10-CM | POA: Diagnosis not present

## 2019-03-06 DIAGNOSIS — E782 Mixed hyperlipidemia: Secondary | ICD-10-CM | POA: Diagnosis not present

## 2019-03-13 DIAGNOSIS — Z0000 Encounter for general adult medical examination without abnormal findings: Secondary | ICD-10-CM | POA: Diagnosis not present

## 2019-03-13 DIAGNOSIS — R739 Hyperglycemia, unspecified: Secondary | ICD-10-CM | POA: Diagnosis not present

## 2019-03-13 DIAGNOSIS — E782 Mixed hyperlipidemia: Secondary | ICD-10-CM | POA: Diagnosis not present

## 2019-03-13 DIAGNOSIS — I471 Supraventricular tachycardia: Secondary | ICD-10-CM | POA: Diagnosis not present

## 2019-03-13 DIAGNOSIS — K760 Fatty (change of) liver, not elsewhere classified: Secondary | ICD-10-CM | POA: Diagnosis not present

## 2019-03-13 DIAGNOSIS — F3342 Major depressive disorder, recurrent, in full remission: Secondary | ICD-10-CM | POA: Diagnosis not present

## 2019-03-13 DIAGNOSIS — M5136 Other intervertebral disc degeneration, lumbar region: Secondary | ICD-10-CM | POA: Diagnosis not present

## 2019-03-16 DIAGNOSIS — F419 Anxiety disorder, unspecified: Secondary | ICD-10-CM | POA: Diagnosis not present

## 2019-03-16 DIAGNOSIS — F339 Major depressive disorder, recurrent, unspecified: Secondary | ICD-10-CM | POA: Diagnosis not present

## 2019-04-07 DIAGNOSIS — R42 Dizziness and giddiness: Secondary | ICD-10-CM | POA: Diagnosis not present

## 2019-04-09 DIAGNOSIS — R42 Dizziness and giddiness: Secondary | ICD-10-CM | POA: Diagnosis not present

## 2019-04-20 ENCOUNTER — Encounter: Payer: PPO | Admitting: Physical Therapy

## 2019-05-07 ENCOUNTER — Ambulatory Visit: Payer: PPO

## 2019-05-07 DIAGNOSIS — N898 Other specified noninflammatory disorders of vagina: Secondary | ICD-10-CM | POA: Diagnosis not present

## 2019-05-11 DIAGNOSIS — F419 Anxiety disorder, unspecified: Secondary | ICD-10-CM | POA: Diagnosis not present

## 2019-05-11 DIAGNOSIS — F339 Major depressive disorder, recurrent, unspecified: Secondary | ICD-10-CM | POA: Diagnosis not present

## 2019-05-19 ENCOUNTER — Other Ambulatory Visit: Payer: Self-pay

## 2019-05-19 ENCOUNTER — Ambulatory Visit: Payer: PPO | Attending: Family

## 2019-05-19 VITALS — BP 133/76 | HR 94

## 2019-05-19 DIAGNOSIS — R42 Dizziness and giddiness: Secondary | ICD-10-CM | POA: Insufficient documentation

## 2019-05-19 NOTE — Therapy (Signed)
[place] MAIN [facility] SERVICES [address] [place], [place], [address] Phone: [phone]   Fax:  [phone]  Physical Therapy Evaluation  Patient Details  Name: Joanna Mendoza MRN: [id number] Date of Birth: 02/06/1946 Referring Provider (PT): Beverly Gust   Encounter Date: 05/19/2019  PT End of Session - 05/20/19 1149    Visit Number  1    Number of Visits  1    Date for PT Re-Evaluation  06/03/19    PT Start Time  1500    PT Stop Time  1600    PT Time Calculation (min)  60 min    Equipment Utilized During Treatment  Gait belt    Activity Tolerance  Patient tolerated treatment well    Behavior During Therapy  [facility] for tasks assessed/performed       Past Medical History:  Diagnosis Date  . Anxiety   . Anxiety   . Arthritis   . Colon polyps   . Depression   . Epigastric pain   . Fatty liver   . Frequent PVCs   . GERD (gastroesophageal reflux disease)   . IBS (irritable bowel syndrome)   . Morbid obesity ([place])   . Nausea   . PSVT (paroxysmal supraventricular tachycardia) ([place])   . Restless legs   . Status post rotator cuff repair     Past Surgical History:  Procedure Laterality Date  . ABDOMINAL HYSTERECTOMY    . benign Left    breast mass removed  . BREAST EXCISIONAL BIOPSY Left 2002   neg  . BREAST SURGERY Left 2002   Breast Biopsy  . CHOLECYSTECTOMY  2011  . COLONOSCOPY WITH ESOPHAGOGASTRODUODENOSCOPY (EGD)    . COLONOSCOPY WITH PROPOFOL N/A 08/07/2017   Procedure: COLONOSCOPY WITH PROPOFOL;  Surgeon: Manya Silvas, MD;  Location: [facility] ENDOSCOPY;  Service: Endoscopy;  Laterality: N/A;  . ddd    . ESOPHAGOGASTRODUODENOSCOPY (EGD) WITH PROPOFOL N/A 08/07/2017   Procedure: ESOPHAGOGASTRODUODENOSCOPY (EGD) WITH PROPOFOL;  Surgeon: Manya Silvas, MD;  Location: [facility] ENDOSCOPY;  Service: Endoscopy;  Laterality: N/A;  . KNEE ARTHROSCOPY Left 2008  . KNEE ARTHROSCOPY    . LAPAROSCOPIC BILATERAL SALPINGO  OOPHERECTOMY    . OOPHORECTOMY    . SHOULDER ACROMIOPLASTY Right 04/06/2015   Procedure: SUBACROMIAL DECOMPRESSION, ROTATOR CUFF REPAIR;  Surgeon: Dereck Leep, MD;  Location: [facility] ORS;  Service: Orthopedics;  Laterality: Right;    Vitals:   05/19/19 1537  BP: 133/76  Pulse: 94  SpO2: 98%     Subjective Assessment - 05/19/19 1454    Subjective  Dizziness    Pertinent History  Pt presented to the ED on 02/07/19 with 2-3 days of worsening nausea with vertigo symptoms and room spinning especially with head movement. She was also complaining of some headache and reportied that when she pushed on the left back of her head the dizziness seeeds to get worse. Per ED note she did have horizontal nystagmus with head movements but reports negative HIT testing. She underwent an MRI which showed a sinus infection.  Her symptoms were determined to be peripheral in nature. She was prescribed Antivert and Valium and had an appointment with ENT on 02/16/19 at which time a VNG study was ordered. VNG showed 28% L UVH as well as abnormal saccades and positional upbeating nystagmus. She reports that she was initially unsure if she wanted to come for a physical therapy evaluation so she waited. Her symptoms have completely resolved at this time.  Diagnostic tests  Brain MRI: negative for infarction, acute L sinus infection    Patient Stated Goals  Reassurance    Currently in Pain?  --   Not related to current episode        [facility] PT Assessment - 05/19/19 1455      Assessment   Medical Diagnosis  Dizziness    Referring Provider (PT)  Beverly Gust    Onset Date/Surgical Date  02/05/19    Hand Dominance  Right    Next MD Visit  PRN with ENT    Prior Therapy  None      Precautions   Precautions  None      Restrictions   Weight Bearing Restrictions  No      Balance Screen   Has the patient fallen in the past 6 months  No    Has the patient had a decrease in activity level because of a fear of  falling?   No    Is the patient reluctant to leave their home because of a fear of falling?   Yes      [place] residence    Living Arrangements  Spouse/significant other    Available Help at Discharge  Family    Type of [place] Access  Level entry    [place]  One level      Prior Function   Level of Independence  Independent      Cognition   Overall Cognitive Status  Within Functional Limits for tasks assessed      Standardized Balance Assessment   Standardized Balance Assessment  Berg Balance Test      Berg Balance Test   Sit to Stand  Able to stand without using hands and stabilize independently    Standing Unsupported  Able to stand safely 2 minutes    Sitting with Back Unsupported but Feet Supported on Floor or Stool  Able to sit safely and securely 2 minutes    Stand to Sit  Sits safely with minimal use of hands    Transfers  Able to transfer safely, minor use of hands    Standing Unsupported with Eyes Closed  Able to stand 10 seconds safely    Standing Unsupported with Feet Together  Able to place feet together independently and stand 1 minute safely    From Standing, Reach Forward with Outstretched Arm  Can reach confidently >25 cm (10")    From Standing Position, Pick up Object from Floor  Able to pick up shoe safely and easily    From Standing Position, Turn to Look Behind Over each Shoulder  Looks behind from both sides and weight shifts well    Turn 360 Degrees  Able to turn 360 degrees safely in 4 seconds or less    Standing Unsupported, Alternately Place Feet on Step/Stool  Able to stand independently and safely and complete 8 steps in 20 seconds    Standing Unsupported, One Foot in Front  Able to place foot tandem independently and hold 30 seconds    Standing on One Leg  Able to lift leg independently and hold > 10 seconds    Total Score  56      Functional Gait  Assessment   Gait assessed   Yes    Gait  Level Surface  Walks 20 ft in less than 5.5 sec, no assistive devices, good speed, no evidence for imbalance, normal  gait pattern, deviates no more than 6 in outside of the 12 in walkway width.    Change in Gait Speed  Able to smoothly change walking speed without loss of balance or gait deviation. Deviate no more than 6 in outside of the 12 in walkway width.    Gait with Horizontal Head Turns  Performs head turns smoothly with no change in gait. Deviates no more than 6 in outside 12 in walkway width    Gait with Vertical Head Turns  Performs head turns with no change in gait. Deviates no more than 6 in outside 12 in walkway width.    Gait and Pivot Turn  Pivot turns safely within 3 sec and stops quickly with no loss of balance.    Step Over Obstacle  Is able to step over 2 stacked shoe boxes taped together (9 in total height) without changing gait speed. No evidence of imbalance.    Gait with Narrow Base of Support  Is able to ambulate for 10 steps heel to toe with no staggering.    Gait with Eyes Closed  Cannot walk 20 ft without assistance, severe gait deviations or imbalance, deviates greater than 15 in outside 12 in walkway width or will not attempt task.    Ambulating Backwards  Walks 20 ft, uses assistive device, slower speed, mild gait deviations, deviates 6-10 in outside 12 in walkway width.    Steps  Alternating feet, must use rail.    Total Score  25          VESTIBULAR AND BALANCE EVALUATION   HISTORY:  Subjective history of current problem: Pt presented to the ED on 02/07/19 with 2-3 days of worsening nausea with vertigo symptoms and room spinning especially with head movement. She was also complaining of some headache and reportied that when she pushed on the left back of her head the dizziness seeeds to get worse. Per ED note she did have horizontal nystagmus with head movements but reports negative HIT testing. She underwent an MRI which showed a sinus infection.  Her symptoms were  determined to be peripheral in nature. She was prescribed Antivert and Valium and had an appointment with ENT on 02/16/19 at which time a VNG study was ordered. VNG showed 28% L UVH as well as abnormal saccades and positional upbeating nystagmus. She reports that she was initially unsure if she wanted to come for a physical therapy evaluation so she waited. Her symptoms have completely resolved at this time.  Description of dizziness: (vertigo, unsteadiness, lightheadedness, falling, general unsteadiness, whoozy, swimmy-headed sensation, aural fullness) None currently but initially vertigo Frequency: none currently but initially was lasting all day Duration: hours initially Symptom nature: (motion provoked, positional, spontaneous, constant, variable, intermittent) spontaneous, motion-provoked  Provocative Factors: rolling in bed Easing Factors: Nothing at the time, better now  Progression of symptoms: (better, worse, no change since onset): resolved History of similar episodes: None  Falls (yes/no): No Number of falls in past 6 months: No  Prior Functional Level: Independent with all activity  Auditory complaints (tinnitus, pain, drainage, hearing loss, aural fullness): tinnitus L ear (resolved), no fluid drainage, no hearing loss Vision (diplopia, visual field loss, recent changes, last eye exam): None  Red Flags: (dysarthria, dysphagia, drop attacks, bowel and bladder changes, recent weight loss/gain) Review of systems negative for red flags.     EXAMINATION  POSTURE: WNL  NEUROLOGICAL SCREEN: (2+ unless otherwise noted.) N=normal  Ab=abnormal  Level Dermatome R L Myotome R L Reflex  R L  C3 Anterior Neck N N Sidebend C2-3 N N Jaw CN V    C4 Top of Shoulder N N Shoulder Shrug C4 N N Hoffman's UMN    C5 Lateral Upper Arm N N Shoulder ABD C4-5 N N Biceps C5-6    C6 Lateral Arm/ Thumb N N Arm Flex/ Wrist Ext C5-6 N N Brachiorad. C5-6    C7 Middle Finger N N Arm Ext//Wrist Flex C6-7 N  N Triceps C7    C8 4th & 5th Finger N N Flex/ Ext Carpi Ulnaris C8 N N Patellar (L3-4)    T1 Medial Arm N N Interossei T1 N N Gastrocnemius    L2 Medial thigh/groin N N Illiopsoas (L2-3) N N     L3 Lower thigh/med.knee N N Quadriceps (L3-4) N N     L4 Medial leg/lat thigh N N Tibialis Ant (L4-5) N N     L5 Lat. leg & dorsal foot N N EHL (L5) N N     S1 post/lat foot/thigh/leg N N Gastrocnemius (S1-2) N N     S2 Post./med. thigh & leg N N Hamstrings (L4-S3) N N       Cranial Nerves Visual acuity and visual fields are intact  Extraocular muscles are intact  Facial sensation is intact bilaterally  Facial strength is intact bilaterally  Hearing is normal as tested by gross conversation Palate elevates midline, normal phonation  Shoulder shrug strength is intact  Tongue protrudes midline    SOMATOSENSORY:         Sensation           Intact      Diminished         Absent  Light touch Normal      COORDINATION: Finger to Nose: Normal Heel to Shin: Normal Pronator Drift: Negative Rapid Alternating Movements: Normal Finger to Thumb Opposition: Normal  MUSCULOSKELETAL SCREEN: Cervical Spine ROM: Mild loss off cervical rotation bilateral and extension. Painless in all planes. No gross deficits identified   ROM: WNL  MMT: WNL  Functional Mobility: Independent for transfers and ambulation without assistive device   Gait: Scanning of visual environment with gait is: WNL    OCULOMOTOR / VESTIBULAR TESTING:  Oculomotor Exam- Room Light  Findings Comments  Ocular Alignment normal   Ocular ROM normal   Spontaneous Nystagmus normal   Gaze-Holding Nystagmus normal   End-Gaze Nystagmus normal   Vergence (normal 2-3") normal 3", wears reading glasses  Smooth Pursuit normal   Cross-Cover Test not examined   Saccades abnormal Consistently hypometric to the L and vertical  VOR Cancellation normal   Left Head Thrust abnormal Corrective saccade  Right Head Thrust abnormal  Corrective saccade  Static Acuity not examined   Dynamic Acuity not examined     Oculomotor Exam- Fixation Suppressed  Findings Comments  Ocular Alignment normal   Spontaneous Nystagmus normal   Gaze-Holding Nystagmus normal   End-Gaze Nystagmus normal   Head Shaking Nystagmus normal   Pressure-Induced Nystagmus not examined   Hyperventilation Induced Nystagmus not examined   Skull Vibration Induced Nystagmus not examined     BPPV TESTS:  Symptoms Duration Intensity Nystagmus  L Dix-Hallpike None   A few L torsional beats?, very faint  R Dix-Hallpike Dizziness A few seconds followed by a headache 1/10 None  L Head Roll None   None  R Head Roll None   None  L Sidelying Test      R Sidelying Test  FUNCTIONAL OUTCOME MEASURES   Results Comments  BERG 56/56 WNL  FGA 25/30 Mild deficits with eyes closed walking  TUG 9.5 seconds WNL  5TSTS 11.5 seconds WNL  ABC Scale 75% Above cut-off  DHI 0/100 WNL                   Objective measurements completed on examination: See above findings.              PT Education - 05/19/19 1523    Education Details  Plan of care and HEP    Person(s) Educated  Patient;Spouse    Methods  Explanation;Demonstration;Verbal cues;Handout    Comprehension  Verbalized understanding;Returned demonstration          PT Long Term Goals - 05/20/19 1151      PT LONG TERM GOAL #1   Title  Pt will be independent with HEP in order to decrease dizziness to improve symptom-free function at home    Time  2    Period  Weeks    Status  Achieved    Target Date  06/02/19             Plan - 05/20/19 1150    Clinical Impression Statement  Pt is a pleasant 73 year-old female referred for dizziness. At time of PT evaluation pt reports that her symptoms have fully resolved. Pt is very flat in her affect during evaluation. Overall her evaluation was relatively insignificant with normal strength, sensation, and  coordination. Saccades to the left and upward are consistently hypometric and she appears to have corrective saccades bilaterally with head impulse testing. She reports 1/10 dizziness in the R Dix-Hallpike position but it is very brief and no nystagmus is observed. She scored a 56/56 on the BERG and her TUG and 5TSTS tests were WNL. Her DHI was a 0/100 and her ABC is 75% which is above the cut-off for increased fall risk. The only objective outcome measures with which she demonstrated mild deficits is a 25/30 on the FGA with the most notable issue being difficulty with straight ambulation with eyes. No indication for vestibular therapy at this time as it seems like her symptoms have resolved. Pt discharged with her agreement today with VOR x 1 horizontal gaze stabilization exercise to continue at home which does provoke some mild dizziness during session.    Personal Factors and Comorbidities  Age;Comorbidity 1;Comorbidity 3+;Past/Current Experience    Comorbidities  Anxiety, depression, obesity    Stability/Clinical Decision Making  Stable/Uncomplicated    Clinical Decision Making  Low    Rehab Potential  Excellent    PT Frequency  1x / week    PT Duration  2 weeks    PT Treatment/Interventions  ADLs/Self Care Home Management;Therapeutic exercise;Therapeutic activities;Neuromuscular re-education;Manual techniques;Vestibular    PT Next Visit Plan  Discharge    PT Home Exercise Plan  VOR x 1 horizontal in sitting    Consulted and Agree with Plan of Care  Patient;Family member/caregiver       Patient will benefit from skilled therapeutic intervention in order to improve the following deficits and impairments:  Dizziness  Visit Diagnosis: Dizziness and giddiness     Problem List Patient Active Problem List   Diagnosis Date Noted  . 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 Urge incontinence 07/30/2017  . Urinary urgency 07/30/2017  . Urinary frequency 07/30/2017  . Microhematuria 07/30/2017   Lyndel Safe  Dwayn Moravek PT, DPT, GCS  Diona Peregoy 05/20/2019, 12:00 PM  [place] MAIN [facility] SERVICES [place], [place], [address] Phone: [phone]   Fax:  [phone]  Name: Joanna Mendoza MRN: [id number] Date of Birth: February 26, 1946

## 2019-06-02 ENCOUNTER — Ambulatory Visit: Payer: PPO

## 2019-07-02 ENCOUNTER — Ambulatory Visit: Payer: PPO | Attending: Unknown Physician Specialty

## 2019-07-02 ENCOUNTER — Other Ambulatory Visit: Payer: Self-pay

## 2019-07-02 DIAGNOSIS — R42 Dizziness and giddiness: Secondary | ICD-10-CM | POA: Diagnosis not present

## 2019-07-02 NOTE — Therapy (Signed)
[place] MAIN [facility] SERVICES [address] [place], [place], [address] Phone: [phone]   Fax:  [phone]  Physical Therapy Evaluation  Patient Details  Name: Joanna Mendoza MRN: [id number] Date of Birth: Feb 06, 1946 Referring Provider (PT): Beverly Gust   Encounter Date: 07/02/2019  PT End of Session - 07/04/19 1623    Visit Number  1    Number of Visits  9    Date for PT Re-Evaluation  08/27/19    PT Start Time  1600    PT Stop Time  1650    PT Time Calculation (min)  50 min    Equipment Utilized During Treatment  Gait belt    Activity Tolerance  Patient tolerated treatment well    Behavior During Therapy  [facility] for tasks assessed/performed       Past Medical History:  Diagnosis Date  . Anxiety   . Anxiety   . Arthritis   . Colon polyps   . Depression   . Epigastric pain   . Fatty liver   . Frequent PVCs   . GERD (gastroesophageal reflux disease)   . IBS (irritable bowel syndrome)   . Morbid obesity ([place])   . Nausea   . PSVT (paroxysmal supraventricular tachycardia) ([place])   . Restless legs   . Status post rotator cuff repair     Past Surgical History:  Procedure Laterality Date  . ABDOMINAL HYSTERECTOMY    . benign Left    breast mass removed  . BREAST EXCISIONAL BIOPSY Left 2002   neg  . BREAST SURGERY Left 2002   Breast Biopsy  . CHOLECYSTECTOMY  2011  . COLONOSCOPY WITH ESOPHAGOGASTRODUODENOSCOPY (EGD)    . COLONOSCOPY WITH PROPOFOL N/A 08/07/2017   Procedure: COLONOSCOPY WITH PROPOFOL;  Surgeon: Manya Silvas, MD;  Location: [facility] ENDOSCOPY;  Service: Endoscopy;  Laterality: N/A;  . ddd    . ESOPHAGOGASTRODUODENOSCOPY (EGD) WITH PROPOFOL N/A 08/07/2017   Procedure: ESOPHAGOGASTRODUODENOSCOPY (EGD) WITH PROPOFOL;  Surgeon: Manya Silvas, MD;  Location: [facility] ENDOSCOPY;  Service: Endoscopy;  Laterality: N/A;  . KNEE ARTHROSCOPY Left 2008  . KNEE ARTHROSCOPY    . LAPAROSCOPIC BILATERAL SALPINGO  OOPHERECTOMY    . OOPHORECTOMY    . SHOULDER ACROMIOPLASTY Right 04/06/2015   Procedure: SUBACROMIAL DECOMPRESSION, ROTATOR CUFF REPAIR;  Surgeon: Dereck Leep, MD;  Location: [facility] ORS;  Service: Orthopedics;  Laterality: Right;    There were no vitals filed for this visit.   Subjective Assessment - 07/04/19 1622    Subjective  Dizziness    Pertinent History  Pt presented to the ED on 02/07/19 with 2-3 days of worsening nausea with vertigo symptoms and room spinning especially with head movement. She was also complaining of some headache and reportied that when she pushed on the left back of her head the dizziness seeeds to get worse. Per ED note she did have horizontal nystagmus with head movements but reports negative HIT testing. She underwent an MRI which showed a sinus infection.  Her symptoms were determined to be peripheral in nature. She was prescribed Antivert and Valium and had an appointment with ENT on 02/16/19 at which time a VNG study was ordered. VNG showed 28% L UVH as well as abnormal saccades and positional upbeating nystagmus. She reports that she was initially unsure if she wanted to come for a physical therapy evaluation so she waited. Her symptoms have completely resolved at this time. 07/02/19: Interval history: Pt reports that approximately 1 week  ago she was in bed when she rolled over to the right side and she felt "very dizzy." She describes the symptoms as vertigo. The first episode lasted a few minutes. Since that time her symptoms have remained about the same. She has not had any follow-up with MD since it started    Diagnostic tests  Brain MRI: negative for infarction, acute L sinus infection    Patient Stated Goals  Resolve vertigo    Currently in Pain?  No/denies         [facility] PT Assessment - 07/04/19 1623      Assessment   Medical Diagnosis  Dizziness    Referring Provider (PT)  Beverly Gust    Onset Date/Surgical Date  06/25/19    Hand Dominance  Right     Next MD Visit  07/13/19 with ENT    Prior Therapy  Previously evaluated by vestibular therapist      Precautions   Precautions  None      Restrictions   Weight Bearing Restrictions  No      [place] residence    Living Arrangements  Spouse/significant other    Available Help at Discharge  Family    Type of [place] Access  Level entry    [place]  One level      Prior Function   Level of [place]   Overall Cognitive Status  Within Functional Limits for tasks assessed          VESTIBULAR AND BALANCE EVALUATION   HISTORY:  Subjective history of current problem:  05/19/19: Pt presented to the ED on 02/07/19 with 2-3 days of worsening nausea with vertigo symptoms and room spinning especially with head movement. She was also complaining of some headache and reportied that when she pushed on the left back of her head the dizziness seeeds to get worse. Per ED note she did have horizontal nystagmus with head movements but reports negative HIT testing. She underwent an MRI which showed a sinus infection. Her symptoms were determined to be peripheral in nature. She was prescribed Antivert and Valium and had an appointment with ENT on 02/16/19 at which time a VNG study was ordered. VNG showed 28% L UVH as well as abnormal saccades and positional upbeating nystagmus. She reports that she was initially unsure if she wanted to come for a physical therapy evaluation so she waited. Her symptoms have completely resolved at this time.   07/02/19: Interval history: Pt reports that approximately 1 week ago she was in bed when she rolled over to the right side and she felt "very dizzy." She describes the symptoms as vertigo. The first episode lasted a few minutes. Since that time her symptoms have remained about the same. She has not had any follow-up with MD since it started  Description of dizziness: (vertigo,  unsteadiness, lightheadedness, falling, general unsteadiness, whoozy, swimmy-headed sensation, aural fullness) vertigo with sensation of general unsteadiness Frequency: multiple times/day Duration: a few minutes Symptom nature: (motion provoked, positional, spontaneous, constant, variable, intermittent) always motion-provoked, never spontaneous  Provocative Factors: rolling onto the right side, laying down onto the right side, turning quickly Easing Factors: sitting and resting, waiting for symptoms to pass  Progression of symptoms: (better, worse, no change since onset) unchanged History of similar episodes: Pt reports that it feels similar to the episode she had 02/07/19  Falls (yes/no): no  Number of falls in past 6 months: no  Auditory complaints (tinnitus, pain, drainage): tinnitus L ear, R ear fullness, no difficulty hearing Vision (last eye exam, diplopia, recent changes): None Numbness/Tingling: none  Current Symptoms: (dysarthria, dysphagia, drop attacks, bowel and bladder changes, recent weight loss/gain). Pt reports her speech feels slow during episode but no slurring otherwise review of systems negative for red flags.   EXAMINATION  POSTURE: WNL  NEUROLOGICAL SCREEN: (2+ unless otherwise noted.) N=normal  Ab=abnormal  Level Dermatome R L Myotome R L Reflex R L  C3 Anterior Neck N N Sidebend C2-3 N N Jaw CN V    C4 Top of Shoulder N N Shoulder Shrug C4 N N Hoffman's UMN    C5 Lateral Upper Arm N N Shoulder ABD C4-5 N N Biceps C5-6    C6 Lateral Arm/ Thumb N N Arm Flex/ Wrist Ext C5-6 N N Brachiorad. C5-6    C7 Middle Finger N N Arm Ext//Wrist Flex C6-7 N N Triceps C7    C8 4th & 5th Finger N N Flex/ Ext Carpi Ulnaris C8 N N Patellar (L3-4)    T1 Medial Arm N N Interossei T1 N N Gastrocnemius    L2 Medial thigh/groin N N Illiopsoas (L2-3) N N     L3 Lower thigh/med.knee N N Quadriceps (L3-4) N N     L4 Medial leg/lat thigh N N Tibialis Ant (L4-5) N N     L5 Lat. leg &  dorsal foot N N EHL (L5) N N     S1 post/lat foot/thigh/leg N N Gastrocnemius (S1-2) N N     S2 Post./med. thigh & leg N N Hamstrings (L4-S3) N N       Cranial Nerves Visual acuity and visual fields are intact  Extraocular muscles are intact  Facial sensation is intact bilaterally  Hearing is normal as tested by gross conversations Shoulder shrug strength is intact    SOMATOSENSORY:         Sensation           Intact      Diminished         Absent  Light touch Normal      COORDINATION: Finger to Nose: Normal Heel to Shin: Normal Pronator Drift: Negative Rapid Alternating Movements: Normal Finger to Thumb Opposition: Normal  MUSCULOSKELETAL SCREEN: Cervical Spine ROM: WFL and painless in all planes. No gross deficits identified   ROM: WNL  MMT: WNL     OCULOMOTOR / VESTIBULAR TESTING:  Oculomotor Exam- Room Light  Findings Comments  Ocular Alignment normal   Ocular ROM normal   Spontaneous Nystagmus normal   Gaze-Holding Nystagmus normal   End-Gaze Nystagmus normal Normal end range nystagmus  Vergence (normal 2-3") not examined   Smooth Pursuit abnormal Saccadic  Cross-Cover Test normal   Saccades abnormal Consistently hypometric L and R and occasionally vertically  VOR Cancellation abnormal Pt reports dizziness, no saccades noted  Left Head Impulse abnormal Corrective saccade  Right Head Impulse normal   Static Acuity not examined   Dynamic Acuity not examined     BPPV TESTS:  Symptoms Duration Intensity Nystagmus  L Dix-Hallpike Mild vertigo   Maybe a few R pure torsional beats but very faint and hard to discern  R Dix-Hallpike Vertigo  15s 10/10 Vigorous upbeating R torsional nystagmus  L Head Roll None   None  R Head Roll None   None  L Sidelying Test      R Sidelying Test  Attempted R Epley maneuver however during second position pt became very nauseated, sat up, and vomited. Will attempt again at next session.          Objective  measurements completed on examination: See above findings.              PT Education - 07/03/19 1332    Education Details  Plan of care and BPPV    Person(s) Educated  Patient    Methods  Explanation;Handout    Comprehension  Verbalized understanding          PT Long Term Goals - 07/04/19 1629      PT LONG TERM GOAL #1   Title  Pt will be independent with HEP in order to decrease dizziness to improve symptom-free function at home    Time  8    Period  Weeks    Status  New    Target Date  08/27/19      PT LONG TERM GOAL #2   Title  Pt will report no further episodes of vertigo when rolling in bed or laying down    Time  8    Period  Weeks    Status  New    Target Date  08/27/19             Plan - 07/04/19 1625    Clinical Impression Statement  Pt is a pleasant 74 year-old female was referred to vestibular physical therapy for dizziness. She came for an evaluation in 05/19/19 and at the time of PT evaluation she reported that her symptoms had fully resolved. At that time all of her outcome measures were WNL and pt was discharged after the initial evaluation. She returned today due to reports on new onset vertigo which started approximately 1 week ago. Symptoms are provoked when rolling to the right in bed and when first laying down to sleep. Her oculomotor exam shows a mixture of central and peripheral findings however the most significant finding was a positive R Dix-Hallpike test for vigorous upbeating R torsional nystagmus with 10/10 severity vertigo. Therapist attempted to perform the Epley maneuver however during the second position pt became severely nauseated, sat up, and began to vomit. 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 Pt will benefit from PT services to address deficits in vertigo in order to return to full symptom-free function at home.    Personal Factors and Comorbidities  Age;Comorbidity  3+;Past/Current Experience    Comorbidities  Anxiety, depression, obesity    Examination-Activity Limitations  Bend    Examination-Participation Restrictions  Cleaning    Stability/Clinical Decision Making  Unstable/Unpredictable    Clinical Decision Making  Low    Rehab Potential  Excellent    PT Frequency  1x / week    PT Duration  8 weeks    PT Treatment/Interventions  ADLs/Self Care Home Management;Therapeutic exercise;Therapeutic activities;Neuromuscular re-education;Manual techniques;Vestibular;Aquatic Therapy;Biofeedback;Canalith Repostioning;Cryotherapy;Electrical Stimulation;Iontophoresis 4mg /ml Dexamethasone;Moist Heat;Traction;Ultrasound;Gait training;Balance training;Patient/family education;Passive range of motion;Dry needling    PT Next Visit Plan  Recheck Dix-Hallpike for R side and treat pt with Epley, have emesis bag nearby    PT Home Exercise Plan  None    Consulted and Agree with Plan of Care  Patient;Family member/caregiver    Family Member Consulted  Spouse       Patient will benefit from skilled therapeutic intervention in order to improve the following deficits and impairments:  Dizziness  Visit Diagnosis: Dizziness and giddiness  Problem List Patient Active Problem List   Diagnosis Date Noted  . 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 Urge incontinence 07/30/2017  . Urinary urgency 07/30/2017  . Urinary frequency 07/30/2017  . Microhematuria 07/30/2017   Phillips Grout PT, DPT, GCS  Saige Busby 07/04/2019, 4:43 PM  [place] MAIN [facility] SERVICES [address] [place], [place], [address] Phone: [phone]   Fax:  [phone]  Name: Joanna Mendoza MRN: [id number] Date of Birth: [birth year]

## 2019-07-07 ENCOUNTER — Ambulatory Visit: Payer: PPO | Attending: Unknown Physician Specialty

## 2019-07-07 ENCOUNTER — Other Ambulatory Visit: Payer: Self-pay

## 2019-07-07 DIAGNOSIS — R42 Dizziness and giddiness: Secondary | ICD-10-CM | POA: Insufficient documentation

## 2019-07-07 NOTE — Therapy (Signed)
[place] MAIN [facility] SERVICES [address] [place], [place], [address] Phone: [phone]   Fax:  [phone]  Physical Therapy Treatment  Patient Details  Name: Joanna Mendoza MRN: [id number] Date of Birth: 1946/01/17 Referring Provider (PT): Beverly Gust   Encounter Date: 07/07/2019  PT End of Session - 07/07/19 0901    Visit Number  2    Number of Visits  9    Date for PT Re-Evaluation  08/27/19    PT Start Time  0855    PT Stop Time  0930    PT Time Calculation (min)  35 min    Equipment Utilized During Treatment  Gait belt    Activity Tolerance  Patient tolerated treatment well    Behavior During Therapy  [facility] for tasks assessed/performed       Past Medical History:  Diagnosis Date  . Anxiety   . Anxiety   . Arthritis   . Colon polyps   . Depression   . Epigastric pain   . Fatty liver   . Frequent PVCs   . GERD (gastroesophageal reflux disease)   . IBS (irritable bowel syndrome)   . Morbid obesity ([place])   . Nausea   . PSVT (paroxysmal supraventricular tachycardia) ([place])   . Restless legs   . Status post rotator cuff repair     Past Surgical History:  Procedure Laterality Date  . ABDOMINAL HYSTERECTOMY    . benign Left    breast mass removed  . BREAST EXCISIONAL BIOPSY Left 2002   neg  . BREAST SURGERY Left 2002   Breast Biopsy  . CHOLECYSTECTOMY  2011  . COLONOSCOPY WITH ESOPHAGOGASTRODUODENOSCOPY (EGD)    . COLONOSCOPY WITH PROPOFOL N/A 08/07/2017   Procedure: COLONOSCOPY WITH PROPOFOL;  Surgeon: Manya Silvas, MD;  Location: [facility] ENDOSCOPY;  Service: Endoscopy;  Laterality: N/A;  . ddd    . ESOPHAGOGASTRODUODENOSCOPY (EGD) WITH PROPOFOL N/A 08/07/2017   Procedure: ESOPHAGOGASTRODUODENOSCOPY (EGD) WITH PROPOFOL;  Surgeon: Manya Silvas, MD;  Location: [facility] ENDOSCOPY;  Service: Endoscopy;  Laterality: N/A;  . KNEE ARTHROSCOPY Left 2008  . KNEE ARTHROSCOPY    . LAPAROSCOPIC BILATERAL SALPINGO OOPHERECTOMY     . OOPHORECTOMY    . SHOULDER ACROMIOPLASTY Right 04/06/2015   Procedure: SUBACROMIAL DECOMPRESSION, ROTATOR CUFF REPAIR;  Surgeon: Dereck Leep, MD;  Location: [facility] ORS;  Service: Orthopedics;  Laterality: Right;    There were no vitals filed for this visit.  Subjective Assessment - 07/07/19 0900    Subjective  Pt reports that she is feeling better and has not had any more bouts of vertigo since her initial evaluation. No pain reported today and no specific questions or concerns upon arrival.    Pertinent History  Pt presented to the ED on 02/07/19 with 2-3 days of worsening nausea with vertigo symptoms and room spinning especially with head movement. She was also complaining of some headache and reportied that when she pushed on the left back of her head the dizziness seeeds to get worse. Per ED note she did have horizontal nystagmus with head movements but reports negative HIT testing. She underwent an MRI which showed a sinus infection.  Her symptoms were determined to be peripheral in nature. She was prescribed Antivert and Valium and had an appointment with ENT on 02/16/19 at which time a VNG study was ordered. VNG showed 28% L UVH as well as abnormal saccades and positional upbeating nystagmus. She reports that she was initially unsure  if she wanted to come for a physical therapy evaluation so she waited. Her symptoms have completely resolved at this time. 07/02/19: Interval history: Pt reports that approximately 1 week ago she was in bed when she rolled over to the right side and she felt "very dizzy." She describes the symptoms as vertigo. The first episode lasted a few minutes. Since that time her symptoms have remained about the same. She has not had any follow-up with MD since it started    Diagnostic tests  Brain MRI: negative for infarction, acute L sinus infection    Patient Stated Goals  Resolve vertigo    Currently in Pain?  No/denies         TREATMENT   Canalith Repositioning  Treatment Performed Dix-Hallpike test bilaterally and it is negative on the left. 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 1 minute holds in each position and retesting between maneuvers as well as after final maneuver. After first maneuver retesting is negative. Repeated one more round of Epley maneuver again with 1 minute holds. After second maneuver Dix-Hallpike testing remains negative. Performed roll test as well with patient which is negative bilaterally.                            PT Long Term Goals - 07/04/19 1629      PT LONG TERM GOAL #1   Title  Pt will be independent with HEP in order to decrease dizziness to improve symptom-free function at home    Time  8    Period  Weeks    Status  New    Target Date  08/27/19      PT LONG TERM GOAL #2   Title  Pt will report no further episodes of vertigo when rolling in bed or laying down    Time  8    Period  Weeks    Status  New    Target Date  08/27/19            Plan - 07/07/19 0901    Clinical Impression Statement  Pt states that she has had no further bouts of vertigo since the initial evaluation. Performed Dix-Hallpike test bilaterally and it is negative on the left. 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 1 minute holds in each position and retesting between maneuvers as well as after final maneuver. After first maneuver retesting is negative. Repeated one more round of Epley maneuver again with 1 minute holds. After second maneuver Dix-Hallpike testing remains negative. Performed roll test as well with patient which is  negative bilaterally. Pt instructed to follow-up with ENT on 07/13/19 as scheduled and return for next therapy session. Will retest for BPPV at that time and once negative bilaterally pt will be discharged.    Personal Factors and Comorbidities  Age;Comorbidity 3+;Past/Current Experience    Comorbidities  Anxiety, depression, obesity    Examination-Activity Limitations  Bend    Examination-Participation Restrictions  Cleaning    Stability/Clinical Decision Making  Unstable/Unpredictable    Rehab Potential  Excellent    PT  Frequency  1x / week    PT Duration  8 weeks    PT Treatment/Interventions  ADLs/Self Care Home Management;Therapeutic exercise;Therapeutic activities;Neuromuscular re-education;Manual techniques;Vestibular;Aquatic Therapy;Biofeedback;Canalith Repostioning;Cryotherapy;Electrical Stimulation;Iontophoresis 4mg /ml Dexamethasone;Moist Heat;Traction;Ultrasound;Gait training;Balance training;Patient/family education;Passive range of motion;Dry needling    PT Next Visit Plan  Recheck Dix-Hallpike for R side and treat pt with Epley if needed ( have emesis bag nearby), if all testing negative then discharge    PT Home Exercise Plan  None    Consulted and Agree with Plan of Care  Patient;Family member/caregiver    Family Member Consulted  Spouse       Patient will benefit from skilled therapeutic intervention in order to improve the following deficits and impairments:  Dizziness  Visit Diagnosis: Dizziness and giddiness     Problem List Patient Active Problem List   Diagnosis Date Noted  . 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 Urge incontinence 07/30/2017  . Urinary urgency 07/30/2017  . Urinary frequency 07/30/2017  . Microhematuria 07/30/2017   Phillips Grout PT, DPT, GCS  Zayda Angell 07/07/2019, 9:24 AM  [place] MAIN [facility] SERVICES [address] [place], [place], [address] Phone: [phone]   Fax:  [phone]  Name: CLEATUS ROZEBOOM MRN: [id number] Date of Birth: 06-28-1945

## 2019-07-10 DIAGNOSIS — R350 Frequency of micturition: Secondary | ICD-10-CM | POA: Diagnosis not present

## 2019-07-10 DIAGNOSIS — R300 Dysuria: Secondary | ICD-10-CM | POA: Diagnosis not present

## 2019-07-13 ENCOUNTER — Other Ambulatory Visit: Payer: Self-pay

## 2019-07-13 ENCOUNTER — Ambulatory Visit: Payer: PPO

## 2019-07-13 DIAGNOSIS — R42 Dizziness and giddiness: Secondary | ICD-10-CM

## 2019-07-13 NOTE — Therapy (Signed)
[place] MAIN [facility] SERVICES [address] [place], [place], [address] Phone: [phone]   Fax:  [phone]  Physical Therapy Treatment  Patient Details  Name: Joanna Mendoza MRN: [id number] Date of Birth: 03-May-1946 Referring Provider (PT): Beverly Gust   Encounter Date: 07/13/2019  PT End of Session - 07/13/19 1722    Visit Number  3    Number of Visits  9    Date for PT Re-Evaluation  08/27/19    PT Start Time  [id number]    PT Stop Time  1640    PT Time Calculation (min)  35 min    Equipment Utilized During Treatment  Gait belt    Activity Tolerance  Patient tolerated treatment well    Behavior During Therapy  [facility] for tasks assessed/performed       Past Medical History:  Diagnosis Date  . Anxiety   . Anxiety   . Arthritis   . Colon polyps   . Depression   . Epigastric pain   . Fatty liver   . Frequent PVCs   . GERD (gastroesophageal reflux disease)   . IBS (irritable bowel syndrome)   . Morbid obesity ([place])   . Nausea   . PSVT (paroxysmal supraventricular tachycardia) ([place])   . Restless legs   . Status post rotator cuff repair     Past Surgical History:  Procedure Laterality Date  . ABDOMINAL HYSTERECTOMY    . benign Left    breast mass removed  . BREAST EXCISIONAL BIOPSY Left 2002   neg  . BREAST SURGERY Left 2002   Breast Biopsy  . CHOLECYSTECTOMY  2011  . COLONOSCOPY WITH ESOPHAGOGASTRODUODENOSCOPY (EGD)    . COLONOSCOPY WITH PROPOFOL N/A 08/07/2017   Procedure: COLONOSCOPY WITH PROPOFOL;  Surgeon: Manya Silvas, MD;  Location: [facility] ENDOSCOPY;  Service: Endoscopy;  Laterality: N/A;  . ddd    . ESOPHAGOGASTRODUODENOSCOPY (EGD) WITH PROPOFOL N/A 08/07/2017   Procedure: ESOPHAGOGASTRODUODENOSCOPY (EGD) WITH PROPOFOL;  Surgeon: Manya Silvas, MD;  Location: [facility] ENDOSCOPY;  Service: Endoscopy;  Laterality: N/A;  . KNEE ARTHROSCOPY Left 2008  . KNEE ARTHROSCOPY    . LAPAROSCOPIC BILATERAL SALPINGO OOPHERECTOMY     . OOPHORECTOMY    . SHOULDER ACROMIOPLASTY Right 04/06/2015   Procedure: SUBACROMIAL DECOMPRESSION, ROTATOR CUFF REPAIR;  Surgeon: Dereck Leep, MD;  Location: [facility] ORS;  Service: Orthopedics;  Laterality: Right;    There were no vitals filed for this visit.  Subjective Assessment - 07/13/19 1720    Subjective  Pt reports that she has continued to have intermittent bouts of vertigo when laying down or rolling over in bed. She saw Dr. Tami Ribas this morning and gave therapist a note that reported pt was negative with all roll test and Dix-Hallpike testing at their office today but they treated her with the Epley maneuver for R posterior canal BPPV due to reports of vertigo during testing. No specific questions or concerns at this time.    Pertinent History  Pt presented to the ED on 02/07/19 with 2-3 days of worsening nausea with vertigo symptoms and room spinning especially with head movement. She was also complaining of some headache and reportied that when she pushed on the left back of her head the dizziness seeeds to get worse. Per ED note she did have horizontal nystagmus with head movements but reports negative HIT testing. She underwent an MRI which showed a sinus infection.  Her symptoms were determined to be peripheral in nature.  She was prescribed Antivert and Valium and had an appointment with ENT on 02/16/19 at which time a VNG study was ordered. VNG showed 28% L UVH as well as abnormal saccades and positional upbeating nystagmus. She reports that she was initially unsure if she wanted to come for a physical therapy evaluation so she waited. Her symptoms have completely resolved at this time. 07/02/19: Interval history: Pt reports that approximately 1 week ago she was in bed when she rolled over to the right side and she felt "very dizzy." She describes the symptoms as vertigo. The first episode lasted a few minutes. Since that time her symptoms have remained about the same. She has not had any  follow-up with MD since it started    Diagnostic tests  Brain MRI: negative for infarction, acute L sinus infection    Patient Stated Goals  Resolve vertigo    Currently in Pain?  No/denies          TREATMENT   Canalith Repositioning Treatment Performed Dix-Hallpike test bilaterally and she has a very faint L torsional nystamgus on the left. No upbeat identified. Pt reports mild "dizziness" but no vertigo. R Dix-Hallpike test is negative initially. Horizontal canal roll testing performed with negative testing to the left. With head rotated to the right it is initially negative however after approximately 30s in the R position pt starts with upbeating R torsional nystagmus. Pt placed into hallpike position and nystagmus persists. Completed one bout of the Epley maneuver with 2 minute holds in each position. After initial Epley maneuver performed R sidelying test which is negative however proceeded to perform Liberatory maneuver for R posterior canal with 2 minute holds in each position. After Liberatory maneuver repeated roll test which is negative bilaterally. R Dix-Hallpike test is negative however L Dix-Hallpike test is positive with faint upbeating L torsional nystagmus with subjective vertigo reported by patient. Will address L posterior canal at next visit.                             PT Long Term Goals - 07/04/19 1629      PT LONG TERM GOAL #1   Title  Pt will be independent with HEP in order to decrease dizziness to improve symptom-free function at home    Time  8    Period  Weeks    Status  New    Target Date  08/27/19      PT LONG TERM GOAL #2   Title  Pt will report no further episodes of vertigo when rolling in bed or laying down    Time  8    Period  Weeks    Status  New    Target Date  08/27/19            Plan - 07/13/19 1722    Clinical Impression Statement  Performed Dix-Hallpike test bilaterally and she has a very faint L torsional  nystamgus on the left. No upbeat identified. Pt reports mild "dizziness" but no vertigo. R Dix-Hallpike test is negative initially. Horizontal canal roll testing performed with negative testing to the left. With head rotated to the right it is initially negative however after approximately 30s in the R position pt starts with upbeating R torsional nystagmus. Pt placed into hallpike position and nystagmus persists. Completed one bout of the Epley maneuver with 2 minute holds in each position. After initial Epley maneuver performed R sidelying test which is  negative however proceeded to perform Liberatory maneuver for R posterior canal with 2 minute holds in each position. After Liberatory maneuver repeated roll test which is negative bilaterally. R Dix-Hallpike test is negative however L Dix-Hallpike test is positive with faint upbeating L torsional nystagmus with subjective vertigo reported by patient. At this point pt appears to also have issues with her L posterior canal not previously identified due to severity of her vertigo/nystagmus in the R Dix-Hallpike position. Will retest at next visit and if pt still present with positive test on the L will treat with canalith repositioning treatment until both sides are clear and pt is asymptomatic.    Personal Factors and Comorbidities  Age;Comorbidity 3+;Past/Current Experience    Comorbidities  Anxiety, depression, obesity    Examination-Activity Limitations  Bend    Examination-Participation Restrictions  Cleaning    Stability/Clinical Decision Making  Unstable/Unpredictable    Rehab Potential  Excellent    PT Frequency  1x / week    PT Duration  8 weeks    PT Treatment/Interventions  ADLs/Self Care Home Management;Therapeutic exercise;Therapeutic activities;Neuromuscular re-education;Manual techniques;Vestibular;Aquatic Therapy;Biofeedback;Canalith Repostioning;Cryotherapy;Electrical Stimulation;Iontophoresis 4mg /ml Dexamethasone;Moist  Heat;Traction;Ultrasound;Gait training;Balance training;Patient/family education;Passive range of motion;Dry needling    PT Next Visit Plan  Recheck Dix-Hallpike for R side and treat pt with Epley if needed ( have emesis bag nearby), if all testing negative then discharge    PT Home Exercise Plan  None    Consulted and Agree with Plan of Care  Patient;Family member/caregiver    Family Member Consulted  Spouse       Patient will benefit from skilled therapeutic intervention in order to improve the following deficits and impairments:  Dizziness  Visit Diagnosis: Dizziness and giddiness     Problem List Patient Active Problem List   Diagnosis Date Noted  . 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 Urge incontinence 07/30/2017  . Urinary urgency 07/30/2017  . Urinary frequency 07/30/2017  . Microhematuria 07/30/2017   Phillips Grout PT, DPT, GCS  Joanna Mendoza 07/13/2019, 5:29 PM  [place] MAIN [facility] SERVICES [address] [place], [place], [address] Phone: [phone]   Fax:  [phone]  Name: Joanna Mendoza MRN: [id number] Date of Birth: [birth year]

## 2019-07-21 DIAGNOSIS — F332 Major depressive disorder, recurrent severe without psychotic features: Secondary | ICD-10-CM | POA: Diagnosis not present

## 2019-07-21 DIAGNOSIS — F39 Unspecified mood [affective] disorder: Secondary | ICD-10-CM | POA: Diagnosis not present

## 2019-07-21 DIAGNOSIS — F4312 Post-traumatic stress disorder, chronic: Secondary | ICD-10-CM | POA: Diagnosis not present

## 2019-07-23 ENCOUNTER — Ambulatory Visit: Payer: PPO

## 2019-07-30 ENCOUNTER — Ambulatory Visit: Payer: PPO

## 2019-07-30 ENCOUNTER — Other Ambulatory Visit: Payer: Self-pay

## 2019-07-30 DIAGNOSIS — R42 Dizziness and giddiness: Secondary | ICD-10-CM

## 2019-07-30 NOTE — Therapy (Signed)
[place] MAIN [facility] SERVICES [address] [place], [place], [address] Phone: [phone]   Fax:  [phone]  Physical Therapy Treatment  Patient Details  Name: CHEREESE BATTENFIELD MRN: [id number] Date of Birth: 1946-02-01 Referring Provider (PT): Beverly Gust   Encounter Date: 07/30/2019  PT End of Session - 07/30/19 0914    Visit Number  4    Number of Visits  9    Date for PT Re-Evaluation  08/27/19    PT Start Time  0847    PT Stop Time  0910    PT Time Calculation (min)  23 min    Equipment Utilized During Treatment  Gait belt    Activity Tolerance  Patient tolerated treatment well    Behavior During Therapy  [facility] for tasks assessed/performed       Past Medical History:  Diagnosis Date  . Anxiety   . Anxiety   . Arthritis   . Colon polyps   . Depression   . Epigastric pain   . Fatty liver   . Frequent PVCs   . GERD (gastroesophageal reflux disease)   . IBS (irritable bowel syndrome)   . Morbid obesity ([place])   . Nausea   . PSVT (paroxysmal supraventricular tachycardia) ([place])   . Restless legs   . Status post rotator cuff repair     Past Surgical History:  Procedure Laterality Date  . ABDOMINAL HYSTERECTOMY    . benign Left    breast mass removed  . BREAST EXCISIONAL BIOPSY Left 2002   neg  . BREAST SURGERY Left 2002   Breast Biopsy  . CHOLECYSTECTOMY  2011  . COLONOSCOPY WITH ESOPHAGOGASTRODUODENOSCOPY (EGD)    . COLONOSCOPY WITH PROPOFOL N/A 08/07/2017   Procedure: COLONOSCOPY WITH PROPOFOL;  Surgeon: Manya Silvas, MD;  Location: [facility] ENDOSCOPY;  Service: Endoscopy;  Laterality: N/A;  . ddd    . ESOPHAGOGASTRODUODENOSCOPY (EGD) WITH PROPOFOL N/A 08/07/2017   Procedure: ESOPHAGOGASTRODUODENOSCOPY (EGD) WITH PROPOFOL;  Surgeon: Manya Silvas, MD;  Location: [facility] ENDOSCOPY;  Service: Endoscopy;  Laterality: N/A;  . KNEE ARTHROSCOPY Left 2008  . KNEE ARTHROSCOPY    . LAPAROSCOPIC BILATERAL SALPINGO  OOPHERECTOMY    . OOPHORECTOMY    . SHOULDER ACROMIOPLASTY Right 04/06/2015   Procedure: SUBACROMIAL DECOMPRESSION, ROTATOR CUFF REPAIR;  Surgeon: Dereck Leep, MD;  Location: [facility] ORS;  Service: Orthopedics;  Laterality: Right;    There were no vitals filed for this visit.  Subjective Assessment - 07/30/19 0913    Subjective  Pt reports that she is doing well today. She denies any episodes of vertigo since her last treatment session. She states that she almost cancelled today's appointment but decided to keep it "just in case."    Pertinent History  Pt presented to the ED on 02/07/19 with 2-3 days of worsening nausea with vertigo symptoms and room spinning especially with head movement. She was also complaining of some headache and reportied that when she pushed on the left back of her head the dizziness seeeds to get worse. Per ED note she did have horizontal nystagmus with head movements but reports negative HIT testing. She underwent an MRI which showed a sinus infection.  Her symptoms were determined to be peripheral in nature. She was prescribed Antivert and Valium and had an appointment with ENT on 02/16/19 at which time a VNG study was ordered. VNG showed 28% L UVH as well as abnormal saccades and positional upbeating nystagmus. She reports that she  was initially unsure if she wanted to come for a physical therapy evaluation so she waited. Her symptoms have completely resolved at this time. 07/02/19: Interval history: Pt reports that approximately 1 week ago she was in bed when she rolled over to the right side and she felt "very dizzy." She describes the symptoms as vertigo. The first episode lasted a few minutes. Since that time her symptoms have remained about the same. She has not had any follow-up with MD since it started    Diagnostic tests  Brain MRI: negative for infarction, acute L sinus infection    Patient Stated Goals  Resolve vertigo    Currently in Pain?  No/denies            TREATMENT   Neuromuscular Re-education  Dix-Hallpike testing performed twice which is negative bilaterally for both symptoms and nystagmus and during all testing.  Roll test negative bilaterally for nystagmus or symptoms. Home Epley handout provided with explanation to patient about how to perform for R posterior canal at home; Goals updated with patient;   Canalith Repositioning Treatment Given history of R posterior canal BPPV completed one bout of the Epley maneuver with 1 minute hold in each position. No nystagmus or vertigo in any position. Utilized Epley treatment to reinforce home Epley maneuver education.                                  PT Long Term Goals - 07/30/19 0914      PT LONG TERM GOAL #1   Title  Pt will be independent with HEP in order to decrease dizziness to improve symptom-free function at home    Time  8    Period  Weeks    Status  Achieved      PT LONG TERM GOAL #2   Title  Pt will report no further episodes of vertigo when rolling in bed or laying down    Time  8    Period  Weeks    Status  Achieved            Plan - 07/30/19 0914    Clinical Impression Statement  Pt reports complete resolution of all vertigo at this time. Dix-Hallpike testing performed twice which is negative bilaterally for both symptoms and nystagmus during all testing. Roll test also performed which is negative bilaterally for nystagmus or symptoms. Given history of R posterior canal BPPV completed one bout of the Epley maneuver with 1 minute hold in each position. No nystagmus or vertigo in any position. Utilized Epley treatment to reinforce home Epley maneuver education. Home Epley handout provided with explanation to patient about how to perform for R posterior canal at home. Pt advised to call and schedule an appointment if symptoms return. Plan is to discharge patient on this date with complete resolution of her symptoms but will not complete the  episode until initial certification period is complete in case pt needs to return.    Personal Factors and Comorbidities  Age;Comorbidity 3+;Past/Current Experience    Comorbidities  Anxiety, depression, obesity    Examination-Activity Limitations  Bend    Examination-Participation Restrictions  Cleaning    Stability/Clinical Decision Making  Unstable/Unpredictable    Rehab Potential  Excellent    PT Frequency  1x / week    PT Duration  8 weeks    PT Treatment/Interventions  ADLs/Self Care Home Management;Therapeutic exercise;Therapeutic activities;Neuromuscular re-education;Manual techniques;Vestibular;Aquatic Therapy;Biofeedback;Canalith Repostioning;Cryotherapy;Dealer  Stimulation;Iontophoresis 4mg /ml Dexamethasone;Moist Heat;Traction;Ultrasound;Gait training;Balance training;Patient/family education;Passive range of motion;Dry needling    PT Next Visit Plan  Plan for discharge on this date    PT [place] furnished    Consulted and Agree with Plan of Care  Patient;Family member/caregiver    Family Member Consulted  Spouse       Patient will benefit from skilled therapeutic intervention in order to improve the following deficits and impairments:  Dizziness  Visit Diagnosis: Dizziness and giddiness     Problem List Patient Active Problem List   Diagnosis Date Noted  . 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 Urge incontinence 07/30/2017  . Urinary urgency 07/30/2017  . Urinary frequency 07/30/2017  . Microhematuria 07/30/2017   Phillips Grout PT, DPT, GCS  Sanchez Hemmer 07/30/2019, 9:21 AM  [place] MAIN [facility] SERVICES [address] [place], [place], [address] Phone: [phone]   Fax:  [phone]  Name: RAMEKA VANDERHEYDEN MRN: [id number] Date of Birth: 12-06-1945

## 2019-08-10 DIAGNOSIS — N390 Urinary tract infection, site not specified: Secondary | ICD-10-CM | POA: Diagnosis not present

## 2019-08-10 DIAGNOSIS — N898 Other specified noninflammatory disorders of vagina: Secondary | ICD-10-CM | POA: Diagnosis not present

## 2019-08-10 DIAGNOSIS — N949 Unspecified condition associated with female genital organs and menstrual cycle: Secondary | ICD-10-CM | POA: Diagnosis not present

## 2019-08-11 DIAGNOSIS — N390 Urinary tract infection, site not specified: Secondary | ICD-10-CM | POA: Diagnosis not present

## 2019-08-13 ENCOUNTER — Ambulatory Visit: Payer: PPO

## 2019-08-17 DIAGNOSIS — H40003 Preglaucoma, unspecified, bilateral: Secondary | ICD-10-CM | POA: Diagnosis not present

## 2019-08-17 DIAGNOSIS — H5213 Myopia, bilateral: Secondary | ICD-10-CM | POA: Diagnosis not present

## 2019-08-18 DIAGNOSIS — F39 Unspecified mood [affective] disorder: Secondary | ICD-10-CM | POA: Diagnosis not present

## 2019-08-18 DIAGNOSIS — F332 Major depressive disorder, recurrent severe without psychotic features: Secondary | ICD-10-CM | POA: Diagnosis not present

## 2019-08-18 DIAGNOSIS — F4312 Post-traumatic stress disorder, chronic: Secondary | ICD-10-CM | POA: Diagnosis not present

## 2019-09-03 ENCOUNTER — Ambulatory Visit: Payer: PPO | Attending: Unknown Physician Specialty

## 2019-09-03 ENCOUNTER — Other Ambulatory Visit: Payer: Self-pay

## 2019-09-03 DIAGNOSIS — R42 Dizziness and giddiness: Secondary | ICD-10-CM | POA: Diagnosis not present

## 2019-09-03 NOTE — Therapy (Addendum)
[place] MAIN [facility] SERVICES [address] [place], [place], [address] Phone: [phone]   Fax:  [phone]  Physical Therapy Evaluation  Patient Details  Name: Joanna Mendoza MRN: [id number] Date of Birth: 1945-07-23 Referring Provider (PT): Beverly Gust   Encounter Date: 09/03/2019  PT End of Session - 09/03/19 1623    Visit Number  1    Number of Visits  9    Date for PT Re-Evaluation  10/29/19    PT Start Time  1620    PT Stop Time  1700    PT Time Calculation (min)  40 min    Activity Tolerance  Patient tolerated treatment well    Behavior During Therapy  [facility] for tasks assessed/performed       Past Medical History:  Diagnosis Date  . Anxiety   . Anxiety   . Arthritis   . Colon polyps   . Depression   . Epigastric pain   . Fatty liver   . Frequent PVCs   . GERD (gastroesophageal reflux disease)   . IBS (irritable bowel syndrome)   . Morbid obesity ([place])   . Nausea   . PSVT (paroxysmal supraventricular tachycardia) ([place])   . Restless legs   . Status post rotator cuff repair     Past Surgical History:  Procedure Laterality Date  . ABDOMINAL HYSTERECTOMY    . benign Left    breast mass removed  . BREAST EXCISIONAL BIOPSY Left 2002   neg  . BREAST SURGERY Left 2002   Breast Biopsy  . CHOLECYSTECTOMY  2011  . COLONOSCOPY WITH ESOPHAGOGASTRODUODENOSCOPY (EGD)    . COLONOSCOPY WITH PROPOFOL N/A 08/07/2017   Procedure: COLONOSCOPY WITH PROPOFOL;  Surgeon: Manya Silvas, MD;  Location: [facility] ENDOSCOPY;  Service: Endoscopy;  Laterality: N/A;  . ddd    . ESOPHAGOGASTRODUODENOSCOPY (EGD) WITH PROPOFOL N/A 08/07/2017   Procedure: ESOPHAGOGASTRODUODENOSCOPY (EGD) WITH PROPOFOL;  Surgeon: Manya Silvas, MD;  Location: [facility] ENDOSCOPY;  Service: Endoscopy;  Laterality: N/A;  . KNEE ARTHROSCOPY Left 2008  . KNEE ARTHROSCOPY    . LAPAROSCOPIC BILATERAL SALPINGO OOPHERECTOMY    . OOPHORECTOMY    . SHOULDER ACROMIOPLASTY  Right 04/06/2015   Procedure: SUBACROMIAL DECOMPRESSION, ROTATOR CUFF REPAIR;  Surgeon: Dereck Leep, MD;  Location: [facility] ORS;  Service: Orthopedics;  Laterality: Right;    There were no vitals filed for this visit.   Subjective Assessment - 09/03/19 1623    Subjective  Dizziness/vertigo    Pertinent History  Pt presented to the ED on 02/07/19 with 2-3 days of worsening nausea with vertigo symptoms and room spinning especially with head movement. She was also complaining of some headache and reportied that when she pushed on the left back of her head the dizziness seeeds to get worse. Per ED note she did have horizontal nystagmus with head movements but reports negative HIT testing. She underwent an MRI which showed a sinus infection.  Her symptoms were determined to be peripheral in nature. She was prescribed Antivert and Valium and had an appointment with ENT on 02/16/19 at which time a VNG study was ordered. VNG showed 28% L UVH as well as abnormal saccades and positional upbeating nystagmus. She reports that she was initially unsure if she wanted to come for a physical therapy evaluation so she waited. Her symptoms have completely resolved at this time. 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 Pt reports  multiple weeks where she was symptom-free however reports that her symptoms returned approximately 1 week ago. No known cause for onset. Pt reports one full day of vertigo followed by intermittent bouts of vertigo with position changes.    Diagnostic tests  Brain MRI: negative for infarction, acute L sinus infection    Patient Stated Goals  Resolve vertigo    Currently in Pain?  No/denies          VESTIBULAR AND BALANCE EVALUATION   HISTORY:  Subjective history of current problem: Prior history: Pt presented to the ED on 02/07/19 with 2-3 days of worsening nausea with vertigo symptoms and room spinning especially  with head movement. She was also complaining of some headache and reportied that when she pushed on the left back of her head the dizziness seeeds to get worse. Per ED note she did have horizontal nystagmus with head movements but reports negative HIT testing. She underwent an MRI which showed a sinus infection. Her symptoms were determined to be peripheral in nature. She was prescribed Antivert and Valium and had an appointment with ENT on 02/16/19 at which time a VNG study was ordered. VNG showed 28% L UVH as well as abnormal saccades and positional upbeating nystagmus. She reports that she was initially unsure if she wanted to come for a physical therapy evaluation so she waited. Her symptoms have completely resolved at this time. 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 Pt reports multiple weeks where she was symptom-free however reports that her symptoms returned approximately 1 week ago. No known cause for onset. Pt reports one full day of vertigo followed by intermittent bouts of vertigo with position changes.  Description of dizziness: (vertigo, unsteadiness, lightheadedness, falling, general unsteadiness, whoozy, swimmy-headed sensation, aural fullness) Vertigo and "lightheadedness" Frequency: Every day but hasn't had symptoms in multiple days Duration: "the first time it lasted all day." Symptom nature: (motion provoked, positional, spontaneous, constant, variable, intermittent) motion-provoked  Provocative Factors: rolling in bed, get up and walk Easing Factors: Waiting for symptoms to pass  Progression of symptoms: (better, worse, no change since onset): Improving History of similar episodes: Yes, recently treated by PT for BPPV  Falls (yes/no): No Number of falls in past 6 months: No  Prior Functional Level: Independent with all activity  Auditory complaints (tinnitus, pain, drainage, hearing loss,  aural fullness): tinnitus L ear, no fluid drainage, no hearing loss Vision (diplopia, visual field loss, recent changes, last eye exam): None  Red Flags: (dysarthria, dysphagia, drop attacks, bowel and bladder changes, recent weight loss/gain) Review of systems negative for red flags.   EXAMINATION  POSTURE:   NEUROLOGICAL SCREEN: (2+ unless otherwise noted.) N=normal  Ab=abnormal  Level Dermatome R L Myotome R L Reflex R L  C3 Anterior Neck N N Sidebend C2-3 N N Jaw CN V    C4 Top of Shoulder N N Shoulder Shrug C4 N N Hoffman's UMN    C5 Lateral Upper Arm N N Shoulder ABD C4-5 N N Biceps C5-6    C6 Lateral Arm/ Thumb N N Arm Flex/ Wrist Ext C5-6 N N Brachiorad. C5-6    C7 Middle Finger N N Arm Ext//Wrist Flex C6-7 N N Triceps C7    C8 4th & 5th Finger N N Flex/ Ext Carpi Ulnaris C8 N N Patellar (L3-4)    T1 Medial Arm N N Interossei T1 N N Gastrocnemius    L2 Medial thigh/groin N N  Illiopsoas (L2-3) N N     L3 Lower thigh/med.knee N N Quadriceps (L3-4) N N     L4 Medial leg/lat thigh N N Tibialis Ant (L4-5) N N     L5 Lat. leg & dorsal foot N N EHL (L5) N N     S1 post/lat foot/thigh/leg N N Gastrocnemius (S1-2) N N     S2 Post./med. thigh & leg N N Hamstrings (L4-S3) N N       Cranial Nerves Visual acuity and visual fields are intact  Extraocular muscles are intact  Facial sensation is intact bilaterally  Facial strength is intact bilaterally  Hearing is normal as tested by gross conversation Palate elevates midline, normal phonation  Shoulder shrug strength is intact  Tongue protrudes midline    SOMATOSENSORY:         Sensation           Intact      Diminished         Absent  Light touch Normal      COORDINATION: Deferred  MUSCULOSKELETAL SCREEN: Cervical Spine ROM: WFL and painless in all planes. No gross deficits identified   ROM: WNL  MMT: WNL  Functional Mobility: Independent for transfers and ambulation without assistive device   Gait: Scanning of  visual environment with gait is: WNL   Clinical Test of Sensory Interaction for Balance    (CTSIB): Deferred   OCULOMOTOR / VESTIBULAR TESTING:  Oculomotor Exam- Room Light  Findings Comments  Ocular Alignment normal   Ocular ROM normal   Spontaneous Nystagmus normal   Gaze-Holding Nystagmus abnormal Possible L horizontal beating nystagmus with L gaze  End-Gaze Nystagmus normal   Vergence (normal 2-3") not examined   Smooth Pursuit normal   Cross-Cover Test not examined   Saccades abnormal Normal horizontal, slow and innacurate vertical  VOR Cancellation abnormal No saccades but pt reports some dizziness  Left Head Impulse normal   Right Head Impulse normal   Static Acuity    not examined   Dynamic Acuity not examined     Oculomotor Exam- Fixation Suppressed: Deferred    BPPV TESTS:  Symptoms Duration Intensity Nystagmus  L Dix-Hallpike None   None  R Dix-Hallpike None   None  L Head Roll None   None  R Head Roll None   None  L Sidelying Test      R Sidelying Test           [facility] PT Assessment - 09/04/19 1001      Assessment   Medical Diagnosis  Dizziness    Referring Provider (PT)  Beverly Gust    Onset Date/Surgical Date  06/25/19    Hand Dominance  Right    Next MD Visit  Not reported    Prior Therapy  Previously evaluated and treated by vestibular therapist for R posterior canal BPPV      Precautions   Precautions  None      Restrictions   Weight Bearing Restrictions  No      Balance Screen   Has the patient fallen in the past 6 months  No    Has the patient had a decrease in activity level because of a fear of falling?   No    Is the patient reluctant to leave their home because of a fear of falling?   No      Home Film/video editor residence    Living Arrangements  Spouse/significant other  Available Help at Discharge  Family    Type of Home  Apartment    Home Access  Level entry    Home Layout  One level       Prior Function   Level of Independence  Independent      Cognition   Overall Cognitive Status  Within Functional Limits for tasks assessed         TREATMENT    Canalith Repositioning Treatment Pt treated with 2 bouts of Epley Maneuver for presumed R posterior canal BPPV. 2 minute holds in each position and retesting between maneuvers. Pt reports dizziness throughout both maneuvers but denies vertigo. Retesting between maneuvers is negative.        Objective measurements completed on examination: See above findings.              PT Education - 09/04/19 1004    Education Details  Plan of care    Person(s) Educated  Patient    Methods  Explanation    Comprehension  Verbalized understanding       PT Short Term Goals - 09/04/19 1005      PT SHORT TERM GOAL #1   Title  Pt will be independent with home Epley manuever and additional HEP for vertigo and balance in order to improve function at home, reduce symptoms, and decrease risk for falls    Time  4    Period  Weeks    Status  New    Target Date  10/02/19        PT Long Term Goals - 09/03/19 1624      PT LONG TERM GOAL #1   Title  Pt will decrease DHI score by at least 18 points in order to demonstrate clinically significant reduction in disability    Baseline  09/03/19: Pt will complete at next episode    Time  8    Period  Weeks    Status  New    Target Date  10/29/19      PT LONG TERM GOAL #2   Title  Pt will report no further episodes of vertigo when rolling in bed or laying down    Time  8    Period  Weeks    Status  New    Target Date  10/29/19             Plan - 09/03/19 1624    Clinical Impression Statement  Pt is a pleasant 74 year-old female was referred to vestibular physical therapy for dizziness. She came for an evaluation in 05/19/19 and at the time of PT evaluation she reported that her symptoms had fully resolved. At that time all of her outcome measures were WNL and pt was  discharged after the initial evaluation. She returned 07/02/19 due to reports on new onset vertigo and at that time testing positive for R posterior canal BPPV. In addition her oculomotor exam shows a mixture of central and peripheral findings. She was treated successfully over the next three visits for R posterior canal with full resolution of her symptoms. However approximately 1 week ago her symptoms returned and she has come back for an evaluation. During visit today all of her BPPV testing is negative. However due to her previous history as well as current complaints pt taken through Epley maneuver for presumed recurrence of R posterior canal BPPV. She is very anxious and her history is somewhat difficult to interpret. She does report that when this restarted she had  a full day of vertigo which is not consistent with BPPV. If pt continues with recurrent episodes with initial onset of prolonged vertigo it might be reasonable to consider additional etiology such as Menire's Disease. Pt will benefit from PT services to address deficits in vertigo in order to return to full symptom-free function at home.    Personal Factors and Comorbidities  Age;Comorbidity 3+;Past/Current Experience    Comorbidities  Anxiety, depression, obesity    Examination-Activity Limitations  Bend    Examination-Participation Restrictions  Cleaning    Stability/Clinical Decision Making  Unstable/Unpredictable    Clinical Decision Making  Low    Rehab Potential  Good    PT Frequency  1x / week    PT Duration  8 weeks    PT Treatment/Interventions  ADLs/Self Care Home Management;Therapeutic exercise;Therapeutic activities;Neuromuscular re-education;Manual techniques;Vestibular;Aquatic Therapy;Biofeedback;Canalith Repostioning;Cryotherapy;Electrical Stimulation;Iontophoresis 4mg /ml Dexamethasone;Moist Heat;Traction;Ultrasound;Gait training;Balance training;Patient/family education;Passive range of motion;Dry needling    PT Next  Visit Plan  FOTO, Retest Dix-Hallpike and treat BPPV as appropriate, BERG, FGA    PT Home Exercise Plan  None currently    Consulted and Agree with Plan of Care  Patient;Family member/caregiver    Family Member Consulted  Spouse       Patient will benefit from skilled therapeutic intervention in order to improve the following deficits and impairments:  Dizziness  Visit Diagnosis: Dizziness and giddiness     Problem List Patient Active Problem List   Diagnosis Date Noted  . 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 Urge incontinence 07/30/2017  . Urinary urgency 07/30/2017  . Urinary frequency 07/30/2017  . Microhematuria 07/30/2017   Phillips Grout PT, DPT, GCS  Azariah Latendresse 09/04/2019, 11:33 AM  [place] MAIN [facility] SERVICES [address] [place], [place], [address] Phone: [phone]   Fax:  [phone]  Name: Joanna Mendoza MRN: [id number] Date of Birth: 10-28-1945

## 2019-09-08 ENCOUNTER — Ambulatory Visit: Payer: PPO

## 2019-09-08 ENCOUNTER — Other Ambulatory Visit: Payer: Self-pay

## 2019-09-08 DIAGNOSIS — R42 Dizziness and giddiness: Secondary | ICD-10-CM | POA: Diagnosis not present

## 2019-09-08 DIAGNOSIS — R739 Hyperglycemia, unspecified: Secondary | ICD-10-CM | POA: Diagnosis not present

## 2019-09-08 DIAGNOSIS — I471 Supraventricular tachycardia: Secondary | ICD-10-CM | POA: Diagnosis not present

## 2019-09-08 DIAGNOSIS — E782 Mixed hyperlipidemia: Secondary | ICD-10-CM | POA: Diagnosis not present

## 2019-09-08 NOTE — Therapy (Signed)
[place] MAIN [facility] SERVICES [address] [place], [place], [address] Phone: [phone]   Fax:  [phone]  Physical Therapy Treatment  Patient Details  Name: HATSUKO TAO MRN: [id number] Date of Birth: June 18, 1945 Referring Provider (PT): Beverly Gust   Encounter Date: 09/08/2019  PT End of Session - 09/08/19 1619    Visit Number  2    Number of Visits  9    Date for PT Re-Evaluation  10/29/19    PT Start Time  [id number]    PT Stop Time  1635    PT Time Calculation (min)  30 min    Equipment Utilized During Treatment  Gait belt    Activity Tolerance  Patient tolerated treatment well    Behavior During Therapy  [facility] for tasks assessed/performed       Past Medical History:  Diagnosis Date  . Anxiety   . Anxiety   . Arthritis   . Colon polyps   . Depression   . Epigastric pain   . Fatty liver   . Frequent PVCs   . GERD (gastroesophageal reflux disease)   . IBS (irritable bowel syndrome)   . Morbid obesity ([place])   . Nausea   . PSVT (paroxysmal supraventricular tachycardia) ([place])   . Restless legs   . Status post rotator cuff repair     Past Surgical History:  Procedure Laterality Date  . ABDOMINAL HYSTERECTOMY    . benign Left    breast mass removed  . BREAST EXCISIONAL BIOPSY Left 2002   neg  . BREAST SURGERY Left 2002   Breast Biopsy  . CHOLECYSTECTOMY  2011  . COLONOSCOPY WITH ESOPHAGOGASTRODUODENOSCOPY (EGD)    . COLONOSCOPY WITH PROPOFOL N/A 08/07/2017   Procedure: COLONOSCOPY WITH PROPOFOL;  Surgeon: Manya Silvas, MD;  Location: [facility] ENDOSCOPY;  Service: Endoscopy;  Laterality: N/A;  . ddd    . ESOPHAGOGASTRODUODENOSCOPY (EGD) WITH PROPOFOL N/A 08/07/2017   Procedure: ESOPHAGOGASTRODUODENOSCOPY (EGD) WITH PROPOFOL;  Surgeon: Manya Silvas, MD;  Location: [facility] ENDOSCOPY;  Service: Endoscopy;  Laterality: N/A;  . KNEE ARTHROSCOPY Left 2008  . KNEE ARTHROSCOPY    . LAPAROSCOPIC BILATERAL SALPINGO OOPHERECTOMY     . OOPHORECTOMY    . SHOULDER ACROMIOPLASTY Right 04/06/2015   Procedure: SUBACROMIAL DECOMPRESSION, ROTATOR CUFF REPAIR;  Surgeon: Dereck Leep, MD;  Location: [facility] ORS;  Service: Orthopedics;  Laterality: Right;    There were no vitals filed for this visit.  Subjective Assessment - 09/08/19 1618    Subjective  Pt reports that she is doing well today. She has not had any further episodes of vertigo since the initial evaluatin. No pain reported today. No specific questions or concerns currently.    Pertinent History  Pt presented to the ED on 02/07/19 with 2-3 days of worsening nausea with vertigo symptoms and room spinning especially with head movement. She was also complaining of some headache and reportied that when she pushed on the left back of her head the dizziness seeeds to get worse. Per ED note she did have horizontal nystagmus with head movements but reports negative HIT testing. She underwent an MRI which showed a sinus infection.  Her symptoms were determined to be peripheral in nature. She was prescribed Antivert and Valium and had an appointment with ENT on 02/16/19 at which time a VNG study was ordered. VNG showed 28% L UVH as well as abnormal saccades and positional upbeating nystagmus. She reports that she was initially unsure if  she wanted to come for a physical therapy evaluation so she waited. Her symptoms have completely resolved at this time. 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 Pt reports multiple weeks where she was symptom-free however reports that her symptoms returned approximately 1 week ago. No known cause for onset. Pt reports one full day of vertigo followed by intermittent bouts of vertigo with position changes.    Diagnostic tests  Brain MRI: negative for infarction, acute L sinus infection    Patient Stated Goals  Resolve vertigo    Currently in Pain?  No/denies         [facility] PT  Assessment - 09/08/19 1643      Standardized Balance Assessment   Standardized Balance Assessment  Berg Balance Test      Berg Balance Test   Sit to Stand  Able to stand without using hands and stabilize independently    Standing Unsupported  Able to stand safely 2 minutes    Sitting with Back Unsupported but Feet Supported on Floor or Stool  Able to sit safely and securely 2 minutes    Stand to Sit  Sits safely with minimal use of hands    Transfers  Able to transfer safely, minor use of hands    Standing Unsupported with Eyes Closed  Able to stand 10 seconds safely    Standing Unsupported with Feet Together  Able to place feet together independently and stand 1 minute safely    From Standing, Reach Forward with Outstretched Arm  Can reach confidently >25 cm (10")    From Standing Position, Pick up Object from Floor  Able to pick up shoe safely and easily    From Standing Position, Turn to Look Behind Over each Shoulder  Looks behind from both sides and weight shifts well    Turn 360 Degrees  Able to turn 360 degrees safely in 4 seconds or less    Standing Unsupported, Alternately Place Feet on Step/Stool  Able to stand independently and safely and complete 8 steps in 20 seconds    Standing Unsupported, One Foot in Front  Able to place foot tandem independently and hold 30 seconds    Standing on One Leg  Able to lift leg independently and hold > 10 seconds    Total Score  56      Functional Gait  Assessment   Gait assessed   Yes    Gait Level Surface  Walks 20 ft in less than 5.5 sec, no assistive devices, good speed, no evidence for imbalance, normal gait pattern, deviates no more than 6 in outside of the 12 in walkway width.    Change in Gait Speed  Able to smoothly change walking speed without loss of balance or gait deviation. Deviate no more than 6 in outside of the 12 in walkway width.    Gait with Horizontal Head Turns  Performs head turns smoothly with slight change in gait  velocity (eg, minor disruption to smooth gait path), deviates 6-10 in outside 12 in walkway width, or uses an assistive device.    Gait with Vertical Head Turns  Performs task with slight change in gait velocity (eg, minor disruption to smooth gait path), deviates 6 - 10 in outside 12 in walkway width or uses assistive device    Gait and Pivot Turn  Pivot turns safely within 3 sec and stops quickly with no loss of balance.  Step Over Obstacle  Is able to step over 2 stacked shoe boxes taped together (9 in total height) without changing gait speed. No evidence of imbalance.    Gait with Narrow Base of Support  Ambulates 7-9 steps.    Gait with Eyes Closed  Walks 20 ft, uses assistive device, slower speed, mild gait deviations, deviates 6-10 in outside 12 in walkway width. Ambulates 20 ft in less than 9 sec but greater than 7 sec.    Ambulating Backwards  Walks 20 ft, uses assistive device, slower speed, mild gait deviations, deviates 6-10 in outside 12 in walkway width.    Steps  Alternating feet, must use rail.    Total Score  24      TREATMENT   Neuromuscular Re-education   Oculomotor Exam- Fixation Suppressed  Findings Comments  Ocular Alignment normal   Spontaneous Nystagmus normal   Gaze-Holding Nystagmus normal   End-Gaze Nystagmus normal   Head Shaking Nystagmus normal   Pressure-Induced Nystagmus not examined   Hyperventilation Induced Nystagmus not examined   Skull Vibration Induced Nystagmus not examined     BPPV TESTS:  Symptoms Duration Intensity Nystagmus  L Dix-Hallpike None   None  R Dix-Hallpike None   None  L Head Roll None   None  R Head Roll None   None  L Sidelying Test      R Sidelying Test         Clinical Test of Sensory Interaction for Balance    (CTSIB):  CONDITION TIME STRATEGY SWAY  Eyes open, firm surface 30 seconds ankle 1+  Eyes closed, firm surface 30 seconds ankle 2+  Eyes open, foam surface 30 seconds ankle 2+  Eyes closed, foam surface 2  seconds Ankle, knee, hip 4+    Performed BERG (56/56) and FGA (24/30) with patient; Discussed plan of care with patient and spouse;   Canalith Repositioning Treatment Pt treated with 2 bouts of Epley Maneuver for presumed R posterior canal BPPV. One minute holds in each position and retesting between maneuvers. Pt reports dizziness throughout both maneuvers but denies vertigo. Retesting between maneuvers is negative.    Pt with negative Dix-Hallpike and roll tests bilaterally with infrared goggles. Oculomotor exam with infrared goggles is also negative on this date. Pt reports no further episodes of vertigo since initial evaluation. Given prior history of BPPV pt treated with 2 bouts of R Epley maneuver. Also performed outcome measures with patient and she scored 56/56 on the BERG and 24/30 on the FGA. She has higher level balance deficits such as slight veering withe horizontal and vertical head turns as well as with eyes closed during gait. Will continue to see patient for another few weeks. Will treat vertigo as appropriate and also work on vestibular/balance exercises. Pt will complete DHI and FOTO at next visit. Pt will benefit from PT services to address deficits in dizziness and balance in order to return to full function at home.                  PT Short Term Goals - 09/04/19 1005      PT SHORT TERM GOAL #1   Title  Pt will be independent with home Epley manuever and additional HEP for vertigo and balance in order to improve function at home, reduce symptoms, and decrease risk for falls    Time  4    Period  Weeks    Status  New    Target Date  10/02/19  PT Long Term Goals - 09/03/19 1624      PT LONG TERM GOAL #1   Title  Pt will decrease DHI score by at least 18 points in order to demonstrate clinically significant reduction in disability    Baseline  09/03/19: Pt will complete at next episode    Time  8    Period  Weeks    Status  New    Target Date   10/29/19      PT LONG TERM GOAL #2   Title  Pt will report no further episodes of vertigo when rolling in bed or laying down    Time  8    Period  Weeks    Status  New    Target Date  10/29/19            Plan - 09/08/19 1619    Clinical Impression Statement  Pt with negative Dix-Hallpike and roll tests bilaterally with infrared goggles. Oculomotor exam with infrared goggles is also negative on this date. Pt reports no further episodes of vertigo since initial evaluation. Given prior history of BPPV pt treated with 2 bouts of R Epley maneuver. Also performed outcome measures with patient and she scored 56/56 on the BERG and 24/30 on the FGA. She has higher level balance deficits such as slight veering withe horizontal and vertical head turns as well as with eyes closed during gait. Will continue to see patient for another few weeks. Will treat vertigo as appropriate and also work on vestibular/balance exercises. Pt will complete DHI and FOTO at next visit. Pt will benefit from PT services to address deficits in dizziness and balance in order to return to full function at home.    Personal Factors and Comorbidities  Age;Comorbidity 3+;Past/Current Experience    Comorbidities  Anxiety, depression, obesity    Examination-Activity Limitations  Bend    Examination-Participation Restrictions  Cleaning    Stability/Clinical Decision Making  Unstable/Unpredictable    Rehab Potential  Good    PT Frequency  1x / week    PT Duration  8 weeks    PT Treatment/Interventions  ADLs/Self Care Home Management;Therapeutic exercise;Therapeutic activities;Neuromuscular re-education;Manual techniques;Vestibular;Aquatic Therapy;Biofeedback;Canalith Repostioning;Cryotherapy;Electrical Stimulation;Iontophoresis 4mg /ml Dexamethasone;Moist Heat;Traction;Ultrasound;Gait training;Balance training;Patient/family education;Passive range of motion;Dry needling    PT Next Visit Plan  FOTO and DHI, Retest Dix-Hallpike and  treat BPPV as appropriate    PT Home Exercise Plan  None currently    Consulted and Agree with Plan of Care  Patient;Family member/caregiver    Family Member Consulted  Spouse       Patient will benefit from skilled therapeutic intervention in order to improve the following deficits and impairments:  Dizziness  Visit Diagnosis: Dizziness and giddiness     Problem List Patient Active Problem List   Diagnosis Date Noted  . 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 Urge incontinence 07/30/2017  . Urinary urgency 07/30/2017  . Urinary frequency 07/30/2017  . Microhematuria 07/30/2017   Phillips Grout PT, DPT, GCS  Sherlie Boyum 09/08/2019, 4:51 PM  [place] MAIN [facility] SERVICES [address] [place], [place], [address] Phone: [phone]   Fax:  [phone]  Name: CHARONDA LEFEVERS MRN: [id number] Date of Birth: 11-15-1945

## 2019-09-10 ENCOUNTER — Other Ambulatory Visit: Payer: Self-pay

## 2019-09-10 ENCOUNTER — Encounter: Payer: Self-pay | Admitting: Urology

## 2019-09-10 ENCOUNTER — Ambulatory Visit: Payer: PPO | Admitting: Urology

## 2019-09-10 VITALS — BP 150/96 | HR 120 | Ht <= 58 in | Wt 151.0 lb

## 2019-09-10 DIAGNOSIS — R350 Frequency of micturition: Secondary | ICD-10-CM | POA: Diagnosis not present

## 2019-09-10 DIAGNOSIS — N3281 Overactive bladder: Secondary | ICD-10-CM

## 2019-09-10 DIAGNOSIS — N390 Urinary tract infection, site not specified: Secondary | ICD-10-CM | POA: Diagnosis not present

## 2019-09-10 LAB — MICROSCOPIC EXAMINATION: WBC, UA: >30 /hpf — AB (ref 0–5)

## 2019-09-10 LAB — BLADDER SCAN AMB NON-IMAGING: Scan Result: 59

## 2019-09-10 LAB — URINALYSIS, COMPLETE
Bilirubin, UA: NEGATIVE
Glucose, UA: NEGATIVE
Ketones, UA: NEGATIVE
Nitrite, UA: NEGATIVE
Protein,UA: NEGATIVE
Specific Gravity, UA: 1.02 (ref 1.005–1.030)
Urobilinogen, Ur: 0.2 mg/dL (ref 0.2–1.0)
pH, UA: 5.5 (ref 5.0–7.5)

## 2019-09-10 NOTE — Progress Notes (Signed)
09/10/2019 02:00 PM   KOUTURE LANDIN 06/09/1945 [id number]  Referring provider: Benjaman Kindler, [place],  [place] [address]  Chief Complaint  Patient presents with  . Recurrent UTI    HPI: Marcile Omura is a 74 yo female who returns today for evaluation and management of UTI.  -States treated for UTI x4 last 4 months -Symptoms dysuria/frequency which resolved after antibiotic treatment -Hx of overactive bladder -Currently asymptomatic -PVR is 59 mL -UA today shows >30 WBC   PMH: Past Medical History:  Diagnosis Date  . Anxiety   . Anxiety   . Arthritis   . Colon polyps   . Depression   . Epigastric pain   . Fatty liver   . Frequent PVCs   . GERD (gastroesophageal reflux disease)   . IBS (irritable bowel syndrome)   . Morbid obesity ([place])   . Nausea   . PSVT (paroxysmal supraventricular tachycardia) ([place])   . Restless legs   . Status post rotator cuff repair     Surgical History: Past Surgical History:  Procedure Laterality Date  . ABDOMINAL HYSTERECTOMY    . benign Left    breast mass removed  . BREAST EXCISIONAL BIOPSY Left 2002   neg  . BREAST SURGERY Left 2002   Breast Biopsy  . CHOLECYSTECTOMY  2011  . COLONOSCOPY WITH ESOPHAGOGASTRODUODENOSCOPY (EGD)    . COLONOSCOPY WITH PROPOFOL N/A 08/07/2017   Procedure: COLONOSCOPY WITH PROPOFOL;  Surgeon: Manya Silvas, MD;  [facility] ENDOSCOPY;  Service: Endoscopy;  Laterality: N/A;  . ddd    . ESOPHAGOGASTRODUODENOSCOPY (EGD) WITH PROPOFOL N/A 08/07/2017   Procedure: ESOPHAGOGASTRODUODENOSCOPY (EGD) WITH PROPOFOL;  Surgeon: Manya Silvas, MD;  Location: [facility] ENDOSCOPY;  Service: Endoscopy;  Laterality: N/A;  . KNEE ARTHROSCOPY Left 2008  . KNEE ARTHROSCOPY    . LAPAROSCOPIC BILATERAL SALPINGO OOPHERECTOMY    . OOPHORECTOMY    . SHOULDER ACROMIOPLASTY Right 04/06/2015   Procedure: SUBACROMIAL DECOMPRESSION, ROTATOR CUFF REPAIR;  Surgeon: Dereck Leep, MD;  Location:  [facility] ORS;  Service: Orthopedics;  Laterality: Right;    Home Medications:  Allergies as of 09/10/2019      Reactions   Celexa [citalopram] Other (See Comments)   "unknown"   Cogentin [benztropine] Other (See Comments)   "unknown"   Flagyl [metronidazole] Other (See Comments)   "unknown"   Hydrocodone    Adhesive [tape] Rash      Medication List       Accurate as of September 10, 2019 10:18 PM. If you have any questions, ask your nurse or doctor.        calcium carbonate 600 MG Tabs tablet Commonly known as: OS-CAL Take 600 mg by mouth daily.   cholecalciferol 1000 units tablet Commonly known as: VITAMIN D Take 1,000 Units by mouth daily.   Chromium Picolinate 200 MCG Caps Take by mouth.   citalopram 20 MG tablet Commonly known as: CELEXA Take 20 mg by mouth daily.   diazepam 2 MG tablet Commonly known as: Valium Take 1 tablet (2 mg total) by mouth every 12 (twelve) hours as needed (vertigo).   estradiol 0.1 mg/24hr patch Commonly known as: CLIMARA - Dosed in mg/24 hr Place 0.1 mg onto the skin once a week.   gabapentin 100 MG capsule Commonly known as: NEURONTIN Take 100 mg by mouth 2 (two) times daily.   hydrOXYzine 25 MG tablet Commonly known as: ATARAX/VISTARIL TK 1 T PO TID PRA   hydrOXYzine 50  MG capsule Commonly known as: VISTARIL Take 50 mg by mouth 2 (two) times daily.   Magnesium Oxide -Mg Supplement 250 MG Tabs Take 250 mg by mouth every morning.   meclizine 12.5 MG tablet Commonly known as: ANTIVERT Take 1 tablet (12.5 mg total) by mouth 3 (three) times daily as needed for dizziness.   Melatonin 5 MG Caps Take 10 mg by mouth at bedtime.   omeprazole 40 MG capsule Commonly known as: PRILOSEC TK 1 C PO QD   pyridOXINE 100 MG tablet Commonly known as: VITAMIN B-6 Take 100 mg by mouth daily.   Refresh Tears 0.5 % Soln Generic drug: carboxymethylcellulose Apply to eye.   tolterodine 4 MG 24 hr capsule Commonly known as: DETROL LA     traZODone 100 MG tablet Commonly known as: DESYREL Take 100 mg by mouth at bedtime.       Allergies:  Allergies  Allergen Reactions  . Celexa [Citalopram] Other (See Comments)    "unknown"  . Cogentin [Benztropine] Other (See Comments)    "unknown"  . Flagyl [Metronidazole] Other (See Comments)    "unknown"  . Hydrocodone   . Adhesive [Tape] Rash    Family History: Family History  Problem Relation Age of Onset  . Kidney disease Sister   . Kidney failure Sister   . Breast cancer Neg Hx   . Kidney cancer Neg Hx   . Prostate cancer Neg Hx     Social History:  reports that she has never smoked. She has never used smokeless tobacco. She reports current alcohol use. She reports that she does not use drugs.   Physical Exam: BP (!) 150/96   Pulse (!) 120   Ht 4\' 8"  (1.422 m)   Wt 151 lb (68.5 kg)   BMI 33.85 kg/m   Constitutional:  Alert and oriented, No acute distress. HEENT: [place] AT, moist mucus membranes.  Trachea midline, no masses. Cardiovascular: No clubbing, cyanosis, or edema. Respiratory: Normal respiratory effort, no increased work of breathing. Skin: No rashes, bruises or suspicious lesions. Neurologic: Grossly intact, no focal deficits, moving all 4 extremities. Psychiatric: Normal mood and affect.  Laboratory Data:  Results for orders placed or performed in visit on 09/10/19  Microscopic Examination   URINE  Result Value Ref Range   WBC, UA >30 (A) 0 - 5 /hpf   RBC 0-2 0 - 2 /hpf   Epithelial Cells (non renal) 0-10 0 - 10 /hpf   Bacteria, UA Moderate (A) None seen/Few  Urinalysis, Complete  Result Value Ref Range   Specific Gravity, UA 1.020 1.005 - 1.030   pH, UA 5.5 5.0 - 7.5   Color, UA Yellow Yellow   Appearance Ur Cloudy (A) Clear   Leukocytes,UA 2+ (A) Negative   Protein,UA Negative Negative/Trace   Glucose, UA Negative Negative   Ketones, UA Negative Negative   RBC, UA Trace (A) Negative   Bilirubin, UA Negative Negative    Urobilinogen, Ur 0.2 0.2 - 1.0 mg/dL   Nitrite, UA Negative Negative   Microscopic Examination See below:   Bladder Scan (Post Void Residual) in office  Result Value Ref Range   Scan Result 59     Assessment & Plan:   1.  Recurrent UTI -Adequate emptying of bladder -UA today with pyuria though asymptomatic.  Urine culture was ordered.  If positive based on recent history would treat and then placed on low-dose prophylaxis x6-8 weeks -Recommend taking cranberry and D-mannose supplements daily    Serin Thornell  Mariana Arn, MD  [facility] [address], [place],  [address] [phone]  I, Noland Fordyce, am acting as a scribe for Dr. John Giovanni.  I have reviewed the above documentation for accuracy and completeness, and I agree with the above.   Abbie Sons, MD

## 2019-09-15 DIAGNOSIS — E782 Mixed hyperlipidemia: Secondary | ICD-10-CM | POA: Diagnosis not present

## 2019-09-15 DIAGNOSIS — F3342 Major depressive disorder, recurrent, in full remission: Secondary | ICD-10-CM | POA: Diagnosis not present

## 2019-09-15 DIAGNOSIS — I493 Ventricular premature depolarization: Secondary | ICD-10-CM | POA: Diagnosis not present

## 2019-09-15 DIAGNOSIS — R739 Hyperglycemia, unspecified: Secondary | ICD-10-CM | POA: Diagnosis not present

## 2019-09-15 DIAGNOSIS — M5136 Other intervertebral disc degeneration, lumbar region: Secondary | ICD-10-CM | POA: Diagnosis not present

## 2019-09-15 DIAGNOSIS — I471 Supraventricular tachycardia: Secondary | ICD-10-CM | POA: Diagnosis not present

## 2019-09-15 DIAGNOSIS — Z0000 Encounter for general adult medical examination without abnormal findings: Secondary | ICD-10-CM | POA: Diagnosis not present

## 2019-09-16 LAB — CULTURE, URINE COMPREHENSIVE

## 2019-09-17 ENCOUNTER — Ambulatory Visit: Payer: PPO

## 2019-09-17 ENCOUNTER — Other Ambulatory Visit: Payer: Self-pay

## 2019-09-17 DIAGNOSIS — R42 Dizziness and giddiness: Secondary | ICD-10-CM

## 2019-09-17 NOTE — Therapy (Signed)
[place] MAIN [facility] SERVICES [address] [place], [place], [address] Phone: [phone]   Fax:  [phone]  Physical Therapy Treatment  Patient Details  Name: Joanna Mendoza MRN: [id number] Date of Birth: January 05, 1946 Referring Provider (PT): Beverly Gust   Encounter Date: 09/17/2019  PT End of Session - 09/17/19 1619    Visit Number  3    Number of Visits  9    Date for PT Re-Evaluation  10/29/19    PT Start Time  1620    PT Stop Time  1705    PT Time Calculation (min)  45 min    Equipment Utilized During Treatment  Gait belt    Activity Tolerance  Patient tolerated treatment well    Behavior During Therapy  [facility] for tasks assessed/performed       Past Medical History:  Diagnosis Date  . Anxiety   . Anxiety   . Arthritis   . Colon polyps   . Depression   . Epigastric pain   . Fatty liver   . Frequent PVCs   . GERD (gastroesophageal reflux disease)   . IBS (irritable bowel syndrome)   . Morbid obesity ([place])   . Nausea   . PSVT (paroxysmal supraventricular tachycardia) ([place])   . Restless legs   . Status post rotator cuff repair     Past Surgical History:  Procedure Laterality Date  . ABDOMINAL HYSTERECTOMY    . benign Left    breast mass removed  . BREAST EXCISIONAL BIOPSY Left 2002   neg  . BREAST SURGERY Left 2002   Breast Biopsy  . CHOLECYSTECTOMY  2011  . COLONOSCOPY WITH ESOPHAGOGASTRODUODENOSCOPY (EGD)    . COLONOSCOPY WITH PROPOFOL N/A 08/07/2017   Procedure: COLONOSCOPY WITH PROPOFOL;  Surgeon: Manya Silvas, MD;  Location: [facility] ENDOSCOPY;  Service: Endoscopy;  Laterality: N/A;  . ddd    . ESOPHAGOGASTRODUODENOSCOPY (EGD) WITH PROPOFOL N/A 08/07/2017   Procedure: ESOPHAGOGASTRODUODENOSCOPY (EGD) WITH PROPOFOL;  Surgeon: Manya Silvas, MD;  Location: [facility] ENDOSCOPY;  Service: Endoscopy;  Laterality: N/A;  . KNEE ARTHROSCOPY Left 2008  . KNEE ARTHROSCOPY    . LAPAROSCOPIC BILATERAL SALPINGO  OOPHERECTOMY    . OOPHORECTOMY    . SHOULDER ACROMIOPLASTY Right 04/06/2015   Procedure: SUBACROMIAL DECOMPRESSION, ROTATOR CUFF REPAIR;  Surgeon: Dereck Leep, MD;  Location: [facility] ORS;  Service: Orthopedics;  Laterality: Right;    There were no vitals filed for this visit.  Subjective Assessment - 09/17/19 1619    Subjective  Pt reports that she is doing well today. She has not had any further episodes of vertigo. No pain reported today. No specific questions or concerns currently.    Pertinent History  Pt presented to the ED on 02/07/19 with 2-3 days of worsening nausea with vertigo symptoms and room spinning especially with head movement. She was also complaining of some headache and reportied that when she pushed on the left back of her head the dizziness seeeds to get worse. Per ED note she did have horizontal nystagmus with head movements but reports negative HIT testing. She underwent an MRI which showed a sinus infection.  Her symptoms were determined to be peripheral in nature. She was prescribed Antivert and Valium and had an appointment with ENT on 02/16/19 at which time a VNG study was ordered. VNG showed 28% L UVH as well as abnormal saccades and positional upbeating nystagmus. She reports that she was initially unsure if she wanted to come  for a physical therapy evaluation so she waited. Her symptoms have completely resolved at this time. 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 Pt reports multiple weeks where she was symptom-free however reports that her symptoms returned approximately 1 week ago. No known cause for onset. Pt reports one full day of vertigo followed by intermittent bouts of vertigo with position changes.    Diagnostic tests  Brain MRI: negative for infarction, acute L sinus infection    Patient Stated Goals  Resolve vertigo    Currently in Pain?  No/denies            TREATMENT   Neuromuscular Re-education  Pt completed [place] = 12/100 (unbilled); Gait in hallway with vertical ball lifts with head/eye follow forward/backward x 75' each; Gait in hallway with horizontal ball tosses with therapist with head/eye follow forward/backward x 75' each to the right and the left; Gait in hallway with ball bounce passes laterally with therapist with head/eye follow right and left x 75' each during forward ambulation; Standing in doorway lateral wall bounces with horizontal head turns both directions x 1 minute;   Canalith Repositioning Treatment Dix-Hallpike test performed which is negative on the R but positive on the L for slow but notable upbeating L torsional nystagmus with "a little bit" of vertigo reported. Pt treated with 2 bouts of Epley Maneuver for presumedLposterior canal BPPV. One minute holds in each position and retesting between maneuvers. Retesting between maneuvers is negative for any vertigo but positive for some continued faint upbeating L torsional beats of nystagmus. Proceeded with second bout of Epley maneuver for L side.    Pt educated throughout session about proper posture and technique with exercises. Improved exercise technique, movement at target joints, use of target muscles after min to mod verbal, visual, tactile cues.     Pt demonstrates excellent motivation during habituation exercises today. She does demonstrate significant lateral staggering with horizontal and vertical ball tosses while following with head/eyes. Pt denies any further bouts of vertigo and R Dix-Hallpike is negative today. However L Dix-Hallpike is faintly positive today with upbeating L torsional nystagmus and pt reports "a little bit" of vertigo. Pt taken through 2 bouts of Epley maneuver for presumed L posterior canal BPPV. Will consider adding VOR x 1 horizontal as HEP at next session. Pt will benefit from PT services to address deficits in strength,  balance, and mobility in order to return to full function at home.                PT Short Term Goals - 09/04/19 1005      PT SHORT TERM GOAL #1   Title  Pt will be independent with home Epley manuever and additional HEP for vertigo and balance in order to improve function at home, reduce symptoms, and decrease risk for falls    Time  4    Period  Weeks    Status  New    Target Date  10/02/19        PT Long Term Goals - 09/03/19 1624      PT LONG TERM GOAL #1   Title  Pt will decrease DHI score by at least 18 points in order to demonstrate clinically significant reduction in disability    Baseline  09/03/19: Pt will complete at next episode    Time  8    Period  Weeks    Status  New  Target Date  10/29/19      PT LONG TERM GOAL #2   Title  Pt will report no further episodes of vertigo when rolling in bed or laying down    Time  8    Period  Weeks    Status  New    Target Date  10/29/19            Plan - 09/17/19 1620    Clinical Impression Statement  Pt demonstrates excellent motivation during habituation exercises today. She does demonstrate significant lateral staggering with horizontal and vertical ball tosses while following with head/eyes. Pt denies any further bouts of vertigo and R Dix-Hallpike is negative today. However L Dix-Hallpike is faintly positive today with upbeating L torsional nystagmus and pt reports "a little bit" of vertigo. Pt taken through 2 bouts of Epley maneuver for presumed L posterior canal BPPV. Will consider adding VOR x 1 horizontal as HEP at next session. Pt will benefit from PT services to address deficits in strength, balance, and mobility in order to return to full function at home.    Personal Factors and Comorbidities  Age;Comorbidity 3+;Past/Current Experience    Comorbidities  Anxiety, depression, obesity    Examination-Activity Limitations  Bend    Examination-Participation Restrictions  Cleaning    Stability/Clinical  Decision Making  Unstable/Unpredictable    Rehab Potential  Good    PT Frequency  1x / week    PT Duration  8 weeks    PT Treatment/Interventions  ADLs/Self Care Home Management;Therapeutic exercise;Therapeutic activities;Neuromuscular re-education;Manual techniques;Vestibular;Aquatic Therapy;Biofeedback;Canalith Repostioning;Cryotherapy;Electrical Stimulation;Iontophoresis 4mg /ml Dexamethasone;Moist Heat;Traction;Ultrasound;Gait training;Balance training;Patient/family education;Passive range of motion;Dry needling    PT Next Visit Plan  FOTO and DHI, Retest Dix-Hallpike and treat BPPV as appropriate    PT Home Exercise Plan  None currently    Consulted and Agree with Plan of Care  Patient;Family member/caregiver    Family Member Consulted  Spouse       Patient will benefit from skilled therapeutic intervention in order to improve the following deficits and impairments:  Dizziness  Visit Diagnosis: Dizziness and giddiness     Problem List Patient Active Problem List   Diagnosis Date Noted  . 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 Urge incontinence 07/30/2017  . Urinary urgency 07/30/2017  . Urinary frequency 07/30/2017  . Microhematuria 07/30/2017   Phillips Grout PT, DPT, GCS  Devita Nies 09/17/2019, 5:10 PM  [place] MAIN [facility] SERVICES [address] [place], [place], [address] Phone: [phone]   Fax:  [phone]  Name: KEYOSHA DEMOULIN MRN: [id number] Date of Birth: August 28, 1945

## 2019-09-21 DIAGNOSIS — F4312 Post-traumatic stress disorder, chronic: Secondary | ICD-10-CM | POA: Diagnosis not present

## 2019-09-21 DIAGNOSIS — F39 Unspecified mood [affective] disorder: Secondary | ICD-10-CM | POA: Diagnosis not present

## 2019-09-21 DIAGNOSIS — F332 Major depressive disorder, recurrent severe without psychotic features: Secondary | ICD-10-CM | POA: Diagnosis not present

## 2019-09-22 ENCOUNTER — Telehealth: Payer: Self-pay | Admitting: *Deleted

## 2019-09-22 ENCOUNTER — Other Ambulatory Visit: Payer: Self-pay | Admitting: Urology

## 2019-09-22 ENCOUNTER — Other Ambulatory Visit: Payer: Self-pay

## 2019-09-22 ENCOUNTER — Ambulatory Visit: Payer: PPO

## 2019-09-22 DIAGNOSIS — R42 Dizziness and giddiness: Secondary | ICD-10-CM | POA: Diagnosis not present

## 2019-09-22 MED ORDER — TRIMETHOPRIM 100 MG PO TABS: 100.0000 mg | ORAL_TABLET | Freq: Every day | ORAL | 2 refills | Status: DC

## 2019-09-22 NOTE — Therapy (Signed)
[place] MAIN [facility] SERVICES [address] [place], [place], [address] Phone: [phone]   Fax:  [phone]  Physical Therapy Treatment  Patient Details  Name: Joanna Mendoza MRN: [id number] Date of Birth: 1945/09/16 Referring Provider (PT): Beverly Gust   Encounter Date: 09/22/2019  PT End of Session - 09/22/19 1712    Visit Number  4    Number of Visits  9    Date for PT Re-Evaluation  10/29/19    PT Start Time  1600    PT Stop Time  1645    PT Time Calculation (min)  45 min    Equipment Utilized During Treatment  Gait belt    Activity Tolerance  Patient tolerated treatment well    Behavior During Therapy  [facility] for tasks assessed/performed       Past Medical History:  Diagnosis Date  . Anxiety   . Anxiety   . Arthritis   . Colon polyps   . Depression   . Epigastric pain   . Fatty liver   . Frequent PVCs   . GERD (gastroesophageal reflux disease)   . IBS (irritable bowel syndrome)   . Morbid obesity ([place])   . Nausea   . PSVT (paroxysmal supraventricular tachycardia) ([place])   . Restless legs   . Status post rotator cuff repair     Past Surgical History:  Procedure Laterality Date  . ABDOMINAL HYSTERECTOMY    . benign Left    breast mass removed  . BREAST EXCISIONAL BIOPSY Left 2002   neg  . BREAST SURGERY Left 2002   Breast Biopsy  . CHOLECYSTECTOMY  2011  . COLONOSCOPY WITH ESOPHAGOGASTRODUODENOSCOPY (EGD)    . COLONOSCOPY WITH PROPOFOL N/A 08/07/2017   Procedure: COLONOSCOPY WITH PROPOFOL;  Surgeon: Manya Silvas, MD;  Location: [facility] ENDOSCOPY;  Service: Endoscopy;  Laterality: N/A;  . ddd    . ESOPHAGOGASTRODUODENOSCOPY (EGD) WITH PROPOFOL N/A 08/07/2017   Procedure: ESOPHAGOGASTRODUODENOSCOPY (EGD) WITH PROPOFOL;  Surgeon: Manya Silvas, MD;  Location: [facility] ENDOSCOPY;  Service: Endoscopy;  Laterality: N/A;  . KNEE ARTHROSCOPY Left 2008  . KNEE ARTHROSCOPY    . LAPAROSCOPIC BILATERAL SALPINGO  OOPHERECTOMY    . OOPHORECTOMY    . SHOULDER ACROMIOPLASTY Right 04/06/2015   Procedure: SUBACROMIAL DECOMPRESSION, ROTATOR CUFF REPAIR;  Surgeon: Dereck Leep, MD;  Location: [facility] ORS;  Service: Orthopedics;  Laterality: Right;    There were no vitals filed for this visit.  Subjective Assessment - 09/22/19 1712    Subjective  Pt reports that she is doing well today. She has not had any further episodes of vertigo. No pain reported today. No specific questions or concerns currently.    Pertinent History  Pt presented to the ED on 02/07/19 with 2-3 days of worsening nausea with vertigo symptoms and room spinning especially with head movement. She was also complaining of some headache and reportied that when she pushed on the left back of her head the dizziness seeeds to get worse. Per ED note she did have horizontal nystagmus with head movements but reports negative HIT testing. She underwent an MRI which showed a sinus infection.  Her symptoms were determined to be peripheral in nature. She was prescribed Antivert and Valium and had an appointment with ENT on 02/16/19 at which time a VNG study was ordered. VNG showed 28% L UVH as well as abnormal saccades and positional upbeating nystagmus. She reports that she was initially unsure if she wanted to come  for a physical therapy evaluation so she waited. Her symptoms have completely resolved at this time. 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 Pt reports multiple weeks where she was symptom-free however reports that her symptoms returned approximately 1 week ago. No known cause for onset. Pt reports one full day of vertigo followed by intermittent bouts of vertigo with position changes.    Diagnostic tests  Brain MRI: negative for infarction, acute L sinus infection    Patient Stated Goals  Resolve vertigo    Currently in Pain?  No/denies            TREATMENT   Neuromuscular Re-education  [place] NBOS eyes open/closed x 30s each; [place] NBOS horizontal and vertical head turns x 30s each; Gait in gym with vertical ball lifts with head/eye follow forward/backward x 40' each; Gait in hallway with horizontal ball tosses with therapist with head/eye follow forward/backward x 40' each to the right and the left; Seated VOR x 1 horizontal plain background x 60s, pt reports 10/10 dizziness so decreased to 30s; Seated VOR x 1 horizontal plain background 2 x 30s, pt continues to report 10/10 dizziness, added to HEP; Pt issued HEP with extensive education to patient and significant other as well as handout about how to perform correctly;   Canalith Repositioning Treatment Dix-Hallpike test performed which is negative on the R but positive on the L for slow but notable upbeating L torsional nystagmus with "a little bit" of vertigo reported. Pt treated with 2 bouts of Epley Maneuver for presumedLposterior canal BPPV.[place] holds in each position and retesting between maneuvers. Retesting between maneuvers is negative for any vertigo or nystagmus. Proceeded with second bout of Epley maneuver for L side.    Pt educated throughout session about proper posture and technique with exercises. Improved exercise technique, movement at target joints, use of target muscles after min to mod verbal, visual, tactile cues.     Pt demonstrates excellent motivation during balance and adaptation/habituation exercises today. Initiated VOR x 1 horizontal in sitting with patient and she reports significant dizziness. Pt provided handout and instructed to perform as part of her HEP. She continues to demonstrate significant lateral staggering with horizontal and vertical ball tosses while following with head/eyes. Dix-Hallpike test performed which is negative on the R but positive on the L for slow but notable upbeating L torsional nystagmus with "a little bit" of  vertigo reported. Pt treated with 2 bouts of Epley Maneuver for presumedLposterior canal BPPV.[place] holds in each position and retesting between maneuvers. Retesting between maneuvers is negative for any vertigo or nystagmus. Pt will continue to benefit from PT services to address deficits in strength, balance, and mobility in order to return to full function at home.                      PT Education - 09/22/19 1714    Education Details  HEP    Person(s) Educated  Patient;Spouse    Methods  Explanation;Demonstration;Verbal cues;Handout    Comprehension  Verbalized understanding;Returned demonstration       PT Short Term Goals - 09/04/19 1005      PT SHORT TERM GOAL #1   Title  Pt will be independent with home Epley manuever and additional HEP for vertigo and balance in order to improve function at home, reduce symptoms, and decrease risk for falls    Time  4    Period  Weeks    Status  New    Target Date  10/02/19        PT Long Term Goals - 09/03/19 1624      PT LONG TERM GOAL #1   Title  Pt will decrease DHI score by at least 18 points in order to demonstrate clinically significant reduction in disability    Baseline  09/03/19: Pt will complete at next episode    Time  8    Period  Weeks    Status  New    Target Date  10/29/19      PT LONG TERM GOAL #2   Title  Pt will report no further episodes of vertigo when rolling in bed or laying down    Time  8    Period  Weeks    Status  New    Target Date  10/29/19            Plan - 09/22/19 1714    Clinical Impression Statement  Pt demonstrates excellent motivation during balance and adaptation/habituation exercises today. Initiated VOR x 1 horizontal in sitting with patient and she reports significant dizziness. Pt provided handout and instructed to perform as part of her HEP. She continues to demonstrate significant lateral staggering with horizontal and vertical ball tosses while following with  head/eyes. Dix-Hallpike test performed which is negative on the R but positive on the L for slow but notable upbeating L torsional nystagmus with "a little bit" of vertigo reported. Pt treated with 2 bouts of Epley Maneuver for presumed L posterior canal BPPV. One minute holds in each position and retesting between maneuvers. Retesting between maneuvers is negative for any vertigo or nystagmus. Pt will continue to benefit from PT services to address deficits in strength, balance, and mobility in order to return to full function at home.    Personal Factors and Comorbidities  Age;Comorbidity 3+;Past/Current Experience    Comorbidities  Anxiety, depression, obesity    Examination-Activity Limitations  Bend    Examination-Participation Restrictions  Cleaning    Stability/Clinical Decision Making  Unstable/Unpredictable    Rehab Potential  Good    PT Frequency  1x / week    PT Duration  8 weeks    PT Treatment/Interventions  ADLs/Self Care Home Management;Therapeutic exercise;Therapeutic activities;Neuromuscular re-education;Manual techniques;Vestibular;Aquatic Therapy;Biofeedback;Canalith Repostioning;Cryotherapy;Electrical Stimulation;Iontophoresis 4mg /ml Dexamethasone;Moist Heat;Traction;Ultrasound;Gait training;Balance training;Patient/family education;Passive range of motion;Dry needling    PT Next Visit Plan  FOTO and DHI, Retest Dix-Hallpike and treat BPPV as appropriate    PT Home Exercise Plan  None currently    Consulted and Agree with Plan of Care  Patient;Family member/caregiver    Family Member Consulted  Spouse       Patient will benefit from skilled therapeutic intervention in order to improve the following deficits and impairments:  Dizziness  Visit Diagnosis: Dizziness and giddiness     Problem List Patient Active Problem List   Diagnosis Date Noted  . 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 Urge incontinence 07/30/2017  . Urinary urgency 07/30/2017  . Urinary frequency 07/30/2017  .  Microhematuria 07/30/2017   Phillips Grout PT, DPT, GCS  Jaslen Adcox 09/22/2019, 5:22 PM  [place] MAIN [facility] SERVICES [address] [place], [place], [address] Phone: [phone]   Fax:  [phone]  Name: AUBRYELLA HOOSE MRN: [id number] Date of Birth: 10/22/1945

## 2019-09-22 NOTE — Telephone Encounter (Signed)
Notified patient as instructed, patient pleased. Discussed follow-up appointments, patient agrees  

## 2019-09-22 NOTE — Patient Instructions (Signed)
Access Code: [email or web address] URL: [email or web address] Date: 09/22/2019 Prepared by: Roxana Hires  Exercises Seated Gaze Stabilization with Head Rotation - 4 x daily - 7 x weekly - 3 reps - 30 seconds hold

## 2019-09-22 NOTE — Telephone Encounter (Signed)
-----   Message from Abbie Sons, MD sent at 09/22/2019  1:22 PM EDT ----- Urine culture had a very low level of bacteria which does not need to be treated however will go ahead and place on low-dose antibiotic prophylaxis and Rx was sent to pharmacy.

## 2019-09-29 ENCOUNTER — Other Ambulatory Visit: Payer: Self-pay

## 2019-09-29 ENCOUNTER — Ambulatory Visit: Payer: PPO

## 2019-09-29 DIAGNOSIS — R42 Dizziness and giddiness: Secondary | ICD-10-CM | POA: Diagnosis not present

## 2019-09-29 NOTE — Patient Instructions (Signed)
Access Code: [email or web address] URL: [email or web address] Date: 09/29/2019 Prepared by: Roxana Hires  Exercises Seated Gaze Stabilization with Head Rotation - 4 x daily - 7 x weekly - 3 reps - 30 seconds hold Romberg Stance with Head Rotation - 2 x daily - 7 x weekly - 3 reps - 30s hold

## 2019-09-29 NOTE — Therapy (Signed)
[place] MAIN [facility] SERVICES [address] [place], [place], [address] Phone: [phone]   Fax:  [phone]  Physical Therapy Treatment  Patient Details  Name: Joanna Mendoza MRN: [id number] Date of Birth: [birth year] Referring Provider (PT): Beverly Gust   Encounter Date: 09/29/2019  PT End of Session - 09/29/19 1551    Visit Number  5    Number of Visits  9    Date for PT Re-Evaluation  10/29/19    PT Start Time  [id number]    PT Stop Time  1638    PT Time Calculation (min)  41 min    Equipment Utilized During Treatment  Gait belt    Activity Tolerance  Patient tolerated treatment well    Behavior During Therapy  [facility] for tasks assessed/performed       Past Medical History:  Diagnosis Date  . Anxiety   . Anxiety   . Arthritis   . Colon polyps   . Depression   . Epigastric pain   . Fatty liver   . Frequent PVCs   . GERD (gastroesophageal reflux disease)   . IBS (irritable bowel syndrome)   . Morbid obesity ([place])   . Nausea   . PSVT (paroxysmal supraventricular tachycardia) ([place])   . Restless legs   . Status post rotator cuff repair     Past Surgical History:  Procedure Laterality Date  . ABDOMINAL HYSTERECTOMY    . benign Left    breast mass removed  . BREAST EXCISIONAL BIOPSY Left 2002   neg  . BREAST SURGERY Left 2002   Breast Biopsy  . CHOLECYSTECTOMY  2011  . COLONOSCOPY WITH ESOPHAGOGASTRODUODENOSCOPY (EGD)    . COLONOSCOPY WITH PROPOFOL N/A 08/07/2017   Procedure: COLONOSCOPY WITH PROPOFOL;  Surgeon: Manya Silvas, MD;  Location: [facility] ENDOSCOPY;  Service: Endoscopy;  Laterality: N/A;  . ddd    . ESOPHAGOGASTRODUODENOSCOPY (EGD) WITH PROPOFOL N/A 08/07/2017   Procedure: ESOPHAGOGASTRODUODENOSCOPY (EGD) WITH PROPOFOL;  Surgeon: Manya Silvas, MD;  Location: [facility] ENDOSCOPY;  Service: Endoscopy;  Laterality: N/A;  . KNEE ARTHROSCOPY Left 2008  . KNEE ARTHROSCOPY    . LAPAROSCOPIC BILATERAL SALPINGO  OOPHERECTOMY    . OOPHORECTOMY    . SHOULDER ACROMIOPLASTY Right 04/06/2015   Procedure: SUBACROMIAL DECOMPRESSION, ROTATOR CUFF REPAIR;  Surgeon: Dereck Leep, MD;  Location: [facility] ORS;  Service: Orthopedics;  Laterality: Right;    There were no vitals filed for this visit.  Subjective Assessment - 09/29/19 1550    Subjective  Pt reports that she is doing well today. She has not had any further episodes of vertigo. She states that the VOR x 1 horizontal exercise makes her very dizzy. No pain reported today. No specific questions.    Pertinent History  Pt presented to the ED on 02/07/19 with 2-3 days of worsening nausea with vertigo symptoms and room spinning especially with head movement. She was also complaining of some headache and reportied that when she pushed on the left back of her head the dizziness seeeds to get worse. Per ED note she did have horizontal nystagmus with head movements but reports negative HIT testing. She underwent an MRI which showed a sinus infection.  Her symptoms were determined to be peripheral in nature. She was prescribed Antivert and Valium and had an appointment with ENT on 02/16/19 at which time a VNG study was ordered. VNG showed 28% L UVH as well as abnormal saccades and positional upbeating nystagmus. She reports  that she was initially unsure if she wanted to come for a physical therapy evaluation so she waited. Her symptoms have completely resolved at this time. 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 Pt reports multiple weeks where she was symptom-free however reports that her symptoms returned approximately 1 week ago. No known cause for onset. Pt reports one full day of vertigo followed by intermittent bouts of vertigo with position changes.    Diagnostic tests  Brain MRI: negative for infarction, acute L sinus infection    Patient Stated Goals  Resolve vertigo    Currently in Pain?   No/denies          TREATMENT   Neuromuscular Re-education  Dix-Hallpike (on inverted table) and roll tests both performed which are negative bilaterally; Seated VOR x 1 horizontal plain background 3 x 30s, pt reports 9-10/10 dizziness, pt advised to continue as part of her HEP; Forward/backward gait in gym with vertical ball self tosses with head/eye x 75' each; Forward/backward gait in hallway with horizontal ball tosses with therapist with head/eye follow x 25' each for both directions; Forward gait in hallway with horizontal ball bounce passes with therapist with head/eye follow x 75' each for both directions; Airex NBOS eyes open/closed x 30s each; Airex NBOS horizontal and vertical head turns x 30s each; Airex alternating 6" step taps x 10 on each side; Airex staggerred stance rear foot on Airex pad and front foot on 6" step 2 x 30s, during second set progressed feet closer to inline position;   Pt educated throughout session about proper posture and technique with exercises. Improved exercise technique, movement at target joints, use of target muscles after min to mod verbal, visual, tactile cues.     Pt demonstrates excellent motivation during balance and adaptation/habituation exercises today. She continues to report high levesl of dizziness with VOR x 1 horizontal so no progressions today. She does demonstrates significantly lateral gait deviation with horizontal ball toss to therapist with head turns during ambulation. Pt encouraged to continue HEP and following as scheduled. Pt will continue to benefit from PT services to address deficits in strength, balance, and mobility in order to return to full function at home.                         PT Short Term Goals - 09/04/19 1005      PT SHORT TERM GOAL #1   Title  Pt will be independent with home Epley manuever and additional HEP for vertigo and balance in order to improve function at home, reduce  symptoms, and decrease risk for falls    Time  4    Period  Weeks    Status  New    Target Date  10/02/19        PT Long Term Goals - 09/03/19 1624      PT LONG TERM GOAL #1   Title  Pt will decrease DHI score by at least 18 points in order to demonstrate clinically significant reduction in disability    Baseline  09/03/19: Pt will complete at next episode    Time  8    Period  Weeks    Status  New    Target Date  10/29/19      PT LONG TERM GOAL #2   Title  Pt will report no further episodes of vertigo when rolling in bed or laying down  Time  8    Period  Weeks    Status  New    Target Date  10/29/19            Plan - 09/29/19 1553    Clinical Impression Statement  Pt demonstrates excellent motivation during balance and adaptation/habituation exercises today. She continues to report high levesl of dizziness with VOR x 1 horizontal so no progressions today. She does demonstrates significantly lateral gait deviation with horizontal ball toss to therapist with head turns during ambulation. Pt encouraged to continue HEP and following as scheduled. Pt will continue to benefit from PT services to address deficits in strength, balance, and mobility in order to return to full function at home.    Personal Factors and Comorbidities  Age;Comorbidity 3+;Past/Current Experience    Comorbidities  Anxiety, depression, obesity    Examination-Activity Limitations  Bend    Examination-Participation Restrictions  Cleaning    Stability/Clinical Decision Making  Unstable/Unpredictable    Rehab Potential  Good    PT Frequency  1x / week    PT Duration  8 weeks    PT Treatment/Interventions  ADLs/Self Care Home Management;Therapeutic exercise;Therapeutic activities;Neuromuscular re-education;Manual techniques;Vestibular;Aquatic Therapy;Biofeedback;Canalith Repostioning;Cryotherapy;Electrical Stimulation;Iontophoresis 4mg /ml Dexamethasone;Moist Heat;Traction;Ultrasound;Gait training;Balance  training;Patient/family education;Passive range of motion;Dry needling    PT Next Visit Plan  FOTO and DHI, Retest Dix-Hallpike and treat BPPV as appropriate    PT Home Exercise Plan  Medbridge Access Code: [email or web address]    Consulted and Agree with Plan of Care  Patient;Family member/caregiver    Family Member Consulted  Spouse       Patient will benefit from skilled therapeutic intervention in order to improve the following deficits and impairments:  Dizziness  Visit Diagnosis: Dizziness and giddiness     Problem List Patient Active Problem List   Diagnosis Date Noted  . 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 Urge incontinence 07/30/2017  . Urinary urgency 07/30/2017  . Urinary frequency 07/30/2017  . Microhematuria 07/30/2017   Phillips Grout PT, DPT, GCS  Emberli Ballester 09/30/2019, 8:32 AM  [place] MAIN [facility] SERVICES [address] [place], [place], [address] Phone: [phone]   Fax:  [phone]  Name: Joanna Mendoza MRN: [id number] Date of Birth: [birth year]

## 2019-10-06 ENCOUNTER — Ambulatory Visit: Payer: PPO

## 2019-10-13 ENCOUNTER — Ambulatory Visit: Payer: PPO

## 2019-10-22 DIAGNOSIS — F4312 Post-traumatic stress disorder, chronic: Secondary | ICD-10-CM | POA: Diagnosis not present

## 2019-10-22 DIAGNOSIS — F39 Unspecified mood [affective] disorder: Secondary | ICD-10-CM | POA: Diagnosis not present

## 2019-10-22 DIAGNOSIS — F332 Major depressive disorder, recurrent severe without psychotic features: Secondary | ICD-10-CM | POA: Diagnosis not present

## 2019-11-12 DIAGNOSIS — F332 Major depressive disorder, recurrent severe without psychotic features: Secondary | ICD-10-CM | POA: Diagnosis not present

## 2019-11-12 DIAGNOSIS — F4312 Post-traumatic stress disorder, chronic: Secondary | ICD-10-CM | POA: Diagnosis not present

## 2019-11-12 DIAGNOSIS — F39 Unspecified mood [affective] disorder: Secondary | ICD-10-CM | POA: Diagnosis not present

## 2019-12-01 DIAGNOSIS — F39 Unspecified mood [affective] disorder: Secondary | ICD-10-CM | POA: Diagnosis not present

## 2019-12-01 DIAGNOSIS — F332 Major depressive disorder, recurrent severe without psychotic features: Secondary | ICD-10-CM | POA: Diagnosis not present

## 2019-12-01 DIAGNOSIS — F4312 Post-traumatic stress disorder, chronic: Secondary | ICD-10-CM | POA: Diagnosis not present

## 2019-12-24 DIAGNOSIS — I471 Supraventricular tachycardia: Secondary | ICD-10-CM | POA: Diagnosis not present

## 2019-12-24 DIAGNOSIS — R3129 Other microscopic hematuria: Secondary | ICD-10-CM | POA: Diagnosis not present

## 2019-12-24 DIAGNOSIS — M5136 Other intervertebral disc degeneration, lumbar region: Secondary | ICD-10-CM | POA: Diagnosis not present

## 2019-12-24 DIAGNOSIS — F3342 Major depressive disorder, recurrent, in full remission: Secondary | ICD-10-CM | POA: Diagnosis not present

## 2019-12-24 DIAGNOSIS — E782 Mixed hyperlipidemia: Secondary | ICD-10-CM | POA: Diagnosis not present

## 2019-12-24 DIAGNOSIS — Z780 Asymptomatic menopausal state: Secondary | ICD-10-CM | POA: Diagnosis not present

## 2019-12-28 DIAGNOSIS — M8588 Other specified disorders of bone density and structure, other site: Secondary | ICD-10-CM | POA: Diagnosis not present

## 2020-01-01 DIAGNOSIS — F332 Major depressive disorder, recurrent severe without psychotic features: Secondary | ICD-10-CM | POA: Diagnosis not present

## 2020-01-01 DIAGNOSIS — F39 Unspecified mood [affective] disorder: Secondary | ICD-10-CM | POA: Diagnosis not present

## 2020-01-01 DIAGNOSIS — F4312 Post-traumatic stress disorder, chronic: Secondary | ICD-10-CM | POA: Diagnosis not present

## 2020-01-11 DIAGNOSIS — F339 Major depressive disorder, recurrent, unspecified: Secondary | ICD-10-CM | POA: Diagnosis not present

## 2020-01-11 DIAGNOSIS — F419 Anxiety disorder, unspecified: Secondary | ICD-10-CM | POA: Diagnosis not present

## 2020-01-28 DIAGNOSIS — R1011 Right upper quadrant pain: Secondary | ICD-10-CM | POA: Diagnosis not present

## 2020-01-28 DIAGNOSIS — G8929 Other chronic pain: Secondary | ICD-10-CM | POA: Diagnosis not present

## 2020-01-28 DIAGNOSIS — K760 Fatty (change of) liver, not elsewhere classified: Secondary | ICD-10-CM | POA: Diagnosis not present

## 2020-01-28 DIAGNOSIS — M5136 Other intervertebral disc degeneration, lumbar region: Secondary | ICD-10-CM | POA: Diagnosis not present

## 2020-01-28 DIAGNOSIS — I471 Supraventricular tachycardia: Secondary | ICD-10-CM | POA: Diagnosis not present

## 2020-01-28 DIAGNOSIS — E782 Mixed hyperlipidemia: Secondary | ICD-10-CM | POA: Diagnosis not present

## 2020-01-28 DIAGNOSIS — F3342 Major depressive disorder, recurrent, in full remission: Secondary | ICD-10-CM | POA: Diagnosis not present

## 2020-02-09 DIAGNOSIS — F332 Major depressive disorder, recurrent severe without psychotic features: Secondary | ICD-10-CM | POA: Diagnosis not present

## 2020-02-09 DIAGNOSIS — F39 Unspecified mood [affective] disorder: Secondary | ICD-10-CM | POA: Diagnosis not present

## 2020-02-09 DIAGNOSIS — F4312 Post-traumatic stress disorder, chronic: Secondary | ICD-10-CM | POA: Diagnosis not present

## 2020-02-22 DIAGNOSIS — F419 Anxiety disorder, unspecified: Secondary | ICD-10-CM | POA: Diagnosis not present

## 2020-02-22 DIAGNOSIS — F339 Major depressive disorder, recurrent, unspecified: Secondary | ICD-10-CM | POA: Diagnosis not present

## 2020-03-09 DIAGNOSIS — M5136 Other intervertebral disc degeneration, lumbar region: Secondary | ICD-10-CM | POA: Diagnosis not present

## 2020-03-09 DIAGNOSIS — E782 Mixed hyperlipidemia: Secondary | ICD-10-CM | POA: Diagnosis not present

## 2020-03-09 DIAGNOSIS — R739 Hyperglycemia, unspecified: Secondary | ICD-10-CM | POA: Diagnosis not present

## 2020-03-09 DIAGNOSIS — I493 Ventricular premature depolarization: Secondary | ICD-10-CM | POA: Diagnosis not present

## 2020-03-16 DIAGNOSIS — G4709 Other insomnia: Secondary | ICD-10-CM | POA: Diagnosis not present

## 2020-03-16 DIAGNOSIS — M5136 Other intervertebral disc degeneration, lumbar region: Secondary | ICD-10-CM | POA: Diagnosis not present

## 2020-03-16 DIAGNOSIS — R1011 Right upper quadrant pain: Secondary | ICD-10-CM | POA: Diagnosis not present

## 2020-03-16 DIAGNOSIS — I471 Supraventricular tachycardia: Secondary | ICD-10-CM | POA: Diagnosis not present

## 2020-03-16 DIAGNOSIS — G8929 Other chronic pain: Secondary | ICD-10-CM | POA: Diagnosis not present

## 2020-03-16 DIAGNOSIS — R3129 Other microscopic hematuria: Secondary | ICD-10-CM | POA: Diagnosis not present

## 2020-03-16 DIAGNOSIS — E782 Mixed hyperlipidemia: Secondary | ICD-10-CM | POA: Diagnosis not present

## 2020-03-16 DIAGNOSIS — R739 Hyperglycemia, unspecified: Secondary | ICD-10-CM | POA: Diagnosis not present

## 2020-04-05 DIAGNOSIS — F39 Unspecified mood [affective] disorder: Secondary | ICD-10-CM | POA: Diagnosis not present

## 2020-04-05 DIAGNOSIS — F332 Major depressive disorder, recurrent severe without psychotic features: Secondary | ICD-10-CM | POA: Diagnosis not present

## 2020-04-05 DIAGNOSIS — F4312 Post-traumatic stress disorder, chronic: Secondary | ICD-10-CM | POA: Diagnosis not present

## 2020-05-05 DIAGNOSIS — F4312 Post-traumatic stress disorder, chronic: Secondary | ICD-10-CM | POA: Diagnosis not present

## 2020-05-05 DIAGNOSIS — F332 Major depressive disorder, recurrent severe without psychotic features: Secondary | ICD-10-CM | POA: Diagnosis not present

## 2020-05-05 DIAGNOSIS — F39 Unspecified mood [affective] disorder: Secondary | ICD-10-CM | POA: Diagnosis not present

## 2020-06-08 DIAGNOSIS — F39 Unspecified mood [affective] disorder: Secondary | ICD-10-CM | POA: Diagnosis not present

## 2020-06-08 DIAGNOSIS — F4312 Post-traumatic stress disorder, chronic: Secondary | ICD-10-CM | POA: Diagnosis not present

## 2020-06-08 DIAGNOSIS — F332 Major depressive disorder, recurrent severe without psychotic features: Secondary | ICD-10-CM | POA: Diagnosis not present

## 2020-06-14 DIAGNOSIS — F419 Anxiety disorder, unspecified: Secondary | ICD-10-CM | POA: Diagnosis not present

## 2020-06-14 DIAGNOSIS — F339 Major depressive disorder, recurrent, unspecified: Secondary | ICD-10-CM | POA: Diagnosis not present

## 2020-07-20 DIAGNOSIS — F39 Unspecified mood [affective] disorder: Secondary | ICD-10-CM | POA: Diagnosis not present

## 2020-07-20 DIAGNOSIS — F4312 Post-traumatic stress disorder, chronic: Secondary | ICD-10-CM | POA: Diagnosis not present

## 2020-07-20 DIAGNOSIS — F332 Major depressive disorder, recurrent severe without psychotic features: Secondary | ICD-10-CM | POA: Diagnosis not present

## 2020-08-09 DIAGNOSIS — F339 Major depressive disorder, recurrent, unspecified: Secondary | ICD-10-CM | POA: Diagnosis not present

## 2020-08-17 DIAGNOSIS — H2513 Age-related nuclear cataract, bilateral: Secondary | ICD-10-CM | POA: Diagnosis not present

## 2020-08-18 DIAGNOSIS — F332 Major depressive disorder, recurrent severe without psychotic features: Secondary | ICD-10-CM | POA: Diagnosis not present

## 2020-08-18 DIAGNOSIS — F39 Unspecified mood [affective] disorder: Secondary | ICD-10-CM | POA: Diagnosis not present

## 2020-08-18 DIAGNOSIS — F4312 Post-traumatic stress disorder, chronic: Secondary | ICD-10-CM | POA: Diagnosis not present

## 2020-09-08 DIAGNOSIS — R739 Hyperglycemia, unspecified: Secondary | ICD-10-CM | POA: Diagnosis not present

## 2020-09-08 DIAGNOSIS — E782 Mixed hyperlipidemia: Secondary | ICD-10-CM | POA: Diagnosis not present

## 2020-09-08 DIAGNOSIS — R3129 Other microscopic hematuria: Secondary | ICD-10-CM | POA: Diagnosis not present

## 2020-09-20 DIAGNOSIS — R1011 Right upper quadrant pain: Secondary | ICD-10-CM | POA: Diagnosis not present

## 2020-09-20 DIAGNOSIS — Z0000 Encounter for general adult medical examination without abnormal findings: Secondary | ICD-10-CM | POA: Diagnosis not present

## 2020-09-20 DIAGNOSIS — F3342 Major depressive disorder, recurrent, in full remission: Secondary | ICD-10-CM | POA: Diagnosis not present

## 2020-09-20 DIAGNOSIS — I471 Supraventricular tachycardia: Secondary | ICD-10-CM | POA: Diagnosis not present

## 2020-09-20 DIAGNOSIS — E782 Mixed hyperlipidemia: Secondary | ICD-10-CM | POA: Diagnosis not present

## 2020-09-20 DIAGNOSIS — G8929 Other chronic pain: Secondary | ICD-10-CM | POA: Diagnosis not present

## 2020-09-20 DIAGNOSIS — M5136 Other intervertebral disc degeneration, lumbar region: Secondary | ICD-10-CM | POA: Diagnosis not present

## 2020-09-20 DIAGNOSIS — R739 Hyperglycemia, unspecified: Secondary | ICD-10-CM | POA: Diagnosis not present

## 2020-09-20 DIAGNOSIS — R3129 Other microscopic hematuria: Secondary | ICD-10-CM | POA: Diagnosis not present

## 2020-09-20 DIAGNOSIS — K760 Fatty (change of) liver, not elsewhere classified: Secondary | ICD-10-CM | POA: Diagnosis not present

## 2020-10-13 DIAGNOSIS — F39 Unspecified mood [affective] disorder: Secondary | ICD-10-CM | POA: Diagnosis not present

## 2020-10-13 DIAGNOSIS — F332 Major depressive disorder, recurrent severe without psychotic features: Secondary | ICD-10-CM | POA: Diagnosis not present

## 2020-10-13 DIAGNOSIS — F4312 Post-traumatic stress disorder, chronic: Secondary | ICD-10-CM | POA: Diagnosis not present

## 2020-11-30 DIAGNOSIS — F4312 Post-traumatic stress disorder, chronic: Secondary | ICD-10-CM | POA: Diagnosis not present

## 2020-11-30 DIAGNOSIS — F39 Unspecified mood [affective] disorder: Secondary | ICD-10-CM | POA: Diagnosis not present

## 2020-11-30 DIAGNOSIS — F332 Major depressive disorder, recurrent severe without psychotic features: Secondary | ICD-10-CM | POA: Diagnosis not present

## 2020-12-13 NOTE — Progress Notes (Deleted)
BH MD/PA/NP OP Progress Note  12/13/2020 2:50 PM Joanna Mendoza  MRN:  [id number]  Chief Complaint:  HPI:  Joanna Mendoza is a 75 y.o. year old female with a history of depression, alcohol use disorder, hyperlipidemia, arthritis, fatty liver disease, , who is referred for depression.      Wt Readings from Last 3 Encounters:  09/10/19 151 lb (68.5 kg)  02/07/19 160 lb (72.6 kg)  07/08/18 164 lb 4.8 oz (74.5 kg)         Visit Diagnosis: No diagnosis found.  Past Psychiatric History:  Outpatient: Per chart review, "her diagnosis includes depression, bipolar and personality disorder, with borderline histrionic traits" Psychiatry admission: multiple of times, last in 11/2013 for depression with relapse in alcohol use in the context of conflict with her step son Previous suicide attempt:  Past trials of medication:  History of violence:    Past Medical History:  Past Medical History:  Diagnosis Date   Anxiety    Anxiety    Arthritis    Colon polyps    Depression    Epigastric pain    Fatty liver    Frequent PVCs    GERD (gastroesophageal reflux disease)    IBS (irritable bowel syndrome)    Morbid obesity ([facility])    Nausea    PSVT (paroxysmal supraventricular tachycardia) ([place])    Restless legs    Status post rotator cuff repair     Past Surgical History:  Procedure Laterality Date   ABDOMINAL HYSTERECTOMY     benign Left    breast mass removed   BREAST EXCISIONAL BIOPSY Left 2002   neg   BREAST SURGERY Left 2002   Breast Biopsy   CHOLECYSTECTOMY  2011   COLONOSCOPY WITH ESOPHAGOGASTRODUODENOSCOPY (EGD)     COLONOSCOPY WITH PROPOFOL N/A 08/07/2017   Procedure: COLONOSCOPY WITH PROPOFOL;  Surgeon: Manya Silvas, MD;  Location: [facility] ENDOSCOPY;  Service: Endoscopy;  Laterality: N/A;   ddd     ESOPHAGOGASTRODUODENOSCOPY (EGD) WITH PROPOFOL N/A 08/07/2017   Procedure: ESOPHAGOGASTRODUODENOSCOPY (EGD) WITH PROPOFOL;  Surgeon: Manya Silvas, MD;  Location: [facility]  ENDOSCOPY;  Service: Endoscopy;  Laterality: N/A;   KNEE ARTHROSCOPY Left 2008   KNEE ARTHROSCOPY     LAPAROSCOPIC BILATERAL SALPINGO OOPHERECTOMY     OOPHORECTOMY     SHOULDER ACROMIOPLASTY Right 04/06/2015   Procedure: SUBACROMIAL DECOMPRESSION, ROTATOR CUFF REPAIR;  Surgeon: Dereck Leep, MD;  Location: [facility] ORS;  Service: Orthopedics;  Laterality: Right;    Family Psychiatric History: ***  Family History:  Family History  Problem Relation Age of Onset   Kidney disease Sister    Kidney failure Sister    Breast cancer Neg Hx    Kidney cancer Neg Hx    Prostate cancer Neg Hx     Social History:  Social History   Socioeconomic History   Marital status: Married    Spouse name: Not on file   Number of children: Not on file   Years of education: Not on file   Highest education level: Not on file  Occupational History   Not on file  Tobacco Use   Smoking status: Never   Smokeless tobacco: Never  Vaping Use   Vaping Use: Never used  Substance and Sexual Activity   Alcohol use: Yes    Comment: occ.   Drug use: No   Sexual activity: Not on file  Other Topics Concern   Not on file  Social History Narrative   Not  on file   Social Determinants of Health   Financial Resource Strain: Not on file  Food Insecurity: Not on file  Transportation Needs: Not on file  Physical Activity: Not on file  Stress: Not on file  Social Connections: Not on file    Allergies:  Allergies  Allergen Reactions   Celexa [Citalopram] Other (See Comments)    "unknown"   Cogentin [Benztropine] Other (See Comments)    "unknown"   Flagyl [Metronidazole] Other (See Comments)    "unknown"   Hydrocodone    Adhesive [Tape] Rash    Metabolic Disorder Labs: No results found for: HGBA1C, MPG No results found for: PROLACTIN No results found for: CHOL, TRIG, HDL, CHOLHDL, VLDL, LDLCALC Lab Results  Component Value Date   TSH 1.88 11/08/2012    Therapeutic Level Labs: No results found  for: LITHIUM No results found for: VALPROATE No components found for:  CBMZ  Current Medications: Current Outpatient Medications  Medication Sig Dispense Refill   calcium carbonate (OS-CAL) 600 MG TABS tablet Take 600 mg by mouth daily.     carboxymethylcellulose (REFRESH TEARS) 0.5 % SOLN Apply to eye.     cholecalciferol (VITAMIN D) 1000 UNITS tablet Take 1,000 Units by mouth daily.     Chromium Picolinate 200 MCG CAPS Take by mouth.     citalopram (CELEXA) 20 MG tablet Take 20 mg by mouth daily.     estradiol (CLIMARA - DOSED IN MG/24 HR) 0.1 mg/24hr patch Place 0.1 mg onto the skin once a week.     gabapentin (NEURONTIN) 100 MG capsule Take 100 mg by mouth 2 (two) times daily.  11   hydrOXYzine (ATARAX/VISTARIL) 25 MG tablet TK 1 T PO TID PRA     hydrOXYzine (VISTARIL) 50 MG capsule Take 50 mg by mouth 2 (two) times daily.  1   Magnesium Oxide -Mg Supplement 250 MG TABS Take 250 mg by mouth every morning.      Melatonin 5 MG CAPS Take 10 mg by mouth at bedtime.      omeprazole (PRILOSEC) 40 MG capsule TK 1 C PO QD     pyridOXINE (VITAMIN B-6) 100 MG tablet Take 100 mg by mouth daily.     tolterodine (DETROL LA) 4 MG 24 hr capsule      traZODone (DESYREL) 100 MG tablet Take 100 mg by mouth at bedtime.     trimethoprim (TRIMPEX) 100 MG tablet Take 1 tablet (100 mg total) by mouth daily. 30 tablet 2   No current facility-administered medications for this visit.     Musculoskeletal: Strength & Muscle Tone:  N/A Gait & Station:  N/A Patient leans: N/A  Psychiatric Specialty Exam: Review of Systems  There were no vitals taken for this visit.There is no height or weight on file to calculate BMI.  General Appearance: {Appearance:22683}  Eye Contact:  {BHH EYE CONTACT:22684}  Speech:  Clear and Coherent  Volume:  Normal  Mood:  {BHH MOOD:22306}  Affect:  {Affect (PAA):22687}  Thought Process:  Coherent  Orientation:  Full (Time, Place, and Person)  Thought Content: Logical    Suicidal Thoughts:  {ST/HT (PAA):22692}  Homicidal Thoughts:  {ST/HT (PAA):22692}  Memory:  Immediate;   Good  Judgement:  {Judgement (PAA):22694}  Insight:  {Insight (PAA):22695}  Psychomotor Activity:  Normal  Concentration:  Concentration: Good and Attention Span: Good  Recall:  Good  Fund of Knowledge: Good  Language: Good  Akathisia:  No  Handed:  Right  AIMS (if indicated): not  done  Assets:  Communication Skills Desire for Improvement  ADL's:  Intact  Cognition: WNL  Sleep:  {BHH GOOD/FAIR/POOR:22877}   Screenings: PHQ2-9    Flowsheet Row Nutrition from 07/08/2018 in [place]  PHQ-2 Total Score 6        Assessment and Plan:    Assessment  Plan   The patient demonstrates the following risk factors for suicide: Chronic risk factors for suicide include: {Chronic Risk Factors for MDEKIYJ:49494473}. Acute risk factors for suicide include: {Acute Risk Factors for FPKGYBN:12787183}. Protective factors for this patient include: {Protective Factors for Suicide ODQV:50016429}. Considering these factors, the overall suicide risk at this point appears to be {Desc; low/moderate/high:110033}. Patient {ACTION; IS/IS IPP:95583167} appropriate for outpatient follow up.       Norman Clay, MD 12/13/2020, 2:50 PM

## 2020-12-19 ENCOUNTER — Encounter: Payer: Self-pay | Admitting: Psychiatry

## 2020-12-19 ENCOUNTER — Other Ambulatory Visit
Admission: RE | Admit: 2020-12-19 | Discharge: 2020-12-19 | Disposition: A | Discharge status: Home / Self Care | Payer: PPO | Source: Ambulatory Visit | Attending: Psychiatry | Admitting: Psychiatry

## 2020-12-19 ENCOUNTER — Other Ambulatory Visit: Payer: Self-pay

## 2020-12-19 ENCOUNTER — Ambulatory Visit (INDEPENDENT_AMBULATORY_CARE_PROVIDER_SITE_OTHER): Payer: PPO | Admitting: Psychiatry

## 2020-12-19 DIAGNOSIS — F321 Major depressive disorder, single episode, moderate: Secondary | ICD-10-CM | POA: Diagnosis not present

## 2020-12-19 DIAGNOSIS — F4310 Post-traumatic stress disorder, unspecified: Secondary | ICD-10-CM | POA: Diagnosis not present

## 2020-12-19 LAB — TSH: TSH: 3.312 u[IU]/mL (ref 0.350–4.500)

## 2020-12-19 NOTE — Patient Instructions (Signed)
Continue Fluvoxamine 100 mg a night Continue Trazodone 100 mg at night Obtain TSH to rule out medical condition contributing to your symptoms Obtain record from your previous psychiatrists Next appointment: 8/15 at 8 AM for in person visit
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
## 2020-12-28 ENCOUNTER — Inpatient Hospital Stay
Admission: RE | Admit: 2020-12-28 | Discharge: 2021-01-06 | DRG: 885 | Disposition: A | Discharge status: Home / Self Care | Payer: PPO | Source: Ambulatory Visit | Attending: Psychiatry | Admitting: Psychiatry

## 2020-12-28 ENCOUNTER — Other Ambulatory Visit: Payer: Self-pay

## 2020-12-28 ENCOUNTER — Emergency Department
Admission: EM | Admit: 2020-12-28 | Discharge: 2020-12-28 | Disposition: A | Discharge status: Home / Self Care | Payer: PPO | Attending: Emergency Medicine | Admitting: Emergency Medicine

## 2020-12-28 DIAGNOSIS — Z79899 Other long term (current) drug therapy: Secondary | ICD-10-CM | POA: Insufficient documentation

## 2020-12-28 DIAGNOSIS — I471 Supraventricular tachycardia: Secondary | ICD-10-CM | POA: Diagnosis present

## 2020-12-28 DIAGNOSIS — F4310 Post-traumatic stress disorder, unspecified: Secondary | ICD-10-CM | POA: Diagnosis not present

## 2020-12-28 DIAGNOSIS — M1712 Unilateral primary osteoarthritis, left knee: Secondary | ICD-10-CM | POA: Diagnosis not present

## 2020-12-28 DIAGNOSIS — I10 Essential (primary) hypertension: Secondary | ICD-10-CM | POA: Diagnosis present

## 2020-12-28 DIAGNOSIS — G4700 Insomnia, unspecified: Secondary | ICD-10-CM | POA: Diagnosis present

## 2020-12-28 DIAGNOSIS — G2581 Restless legs syndrome: Secondary | ICD-10-CM | POA: Diagnosis not present

## 2020-12-28 DIAGNOSIS — Y900 Blood alcohol level of less than 20 mg/100 ml: Secondary | ICD-10-CM | POA: Diagnosis not present

## 2020-12-28 DIAGNOSIS — F429 Obsessive-compulsive disorder, unspecified: Secondary | ICD-10-CM | POA: Diagnosis not present

## 2020-12-28 DIAGNOSIS — Z888 Allergy status to other drugs, medicaments and biological substances status: Secondary | ICD-10-CM | POA: Diagnosis not present

## 2020-12-28 DIAGNOSIS — Z20822 Contact with and (suspected) exposure to covid-19: Secondary | ICD-10-CM | POA: Insufficient documentation

## 2020-12-28 DIAGNOSIS — Z841 Family history of disorders of kidney and ureter: Secondary | ICD-10-CM | POA: Diagnosis not present

## 2020-12-28 DIAGNOSIS — Z6830 Body mass index (BMI) 30.0-30.9, adult: Secondary | ICD-10-CM | POA: Diagnosis not present

## 2020-12-28 DIAGNOSIS — F332 Major depressive disorder, recurrent severe without psychotic features: Secondary | ICD-10-CM | POA: Diagnosis not present

## 2020-12-28 DIAGNOSIS — Z91048 Other nonmedicinal substance allergy status: Secondary | ICD-10-CM | POA: Diagnosis not present

## 2020-12-28 DIAGNOSIS — R45851 Suicidal ideations: Secondary | ICD-10-CM | POA: Diagnosis not present

## 2020-12-28 DIAGNOSIS — F4312 Post-traumatic stress disorder, chronic: Secondary | ICD-10-CM | POA: Diagnosis not present

## 2020-12-28 DIAGNOSIS — Z818 Family history of other mental and behavioral disorders: Secondary | ICD-10-CM

## 2020-12-28 DIAGNOSIS — Z136 Encounter for screening for cardiovascular disorders: Secondary | ICD-10-CM | POA: Diagnosis not present

## 2020-12-28 DIAGNOSIS — F39 Unspecified mood [affective] disorder: Secondary | ICD-10-CM | POA: Diagnosis not present

## 2020-12-28 DIAGNOSIS — R4189 Other symptoms and signs involving cognitive functions and awareness: Secondary | ICD-10-CM | POA: Diagnosis present

## 2020-12-28 DIAGNOSIS — K219 Gastro-esophageal reflux disease without esophagitis: Secondary | ICD-10-CM | POA: Diagnosis present

## 2020-12-28 DIAGNOSIS — K760 Fatty (change of) liver, not elsewhere classified: Secondary | ICD-10-CM | POA: Diagnosis not present

## 2020-12-28 LAB — COMPREHENSIVE METABOLIC PANEL
ALT: 37 U/L (ref 0–44)
AST: 44 U/L — ABNORMAL HIGH (ref 15–41)
Albumin: 4.1 g/dL (ref 3.5–5.0)
Alkaline Phosphatase: 44 U/L (ref 38–126)
Anion gap: 9 (ref 5–15)
BUN: 16 mg/dL (ref 8–23)
CO2: 24 mmol/L (ref 22–32)
Calcium: 9.4 mg/dL (ref 8.9–10.3)
Chloride: 104 mmol/L (ref 98–111)
Creatinine, Ser: 0.77 mg/dL (ref 0.44–1.00)
GFR, Estimated: >60 mL/min (ref >60)
Glucose, Bld: 105 mg/dL — ABNORMAL HIGH (ref 70–99)
Potassium: 4.7 mmol/L (ref 3.5–5.1)
Sodium: 137 mmol/L (ref 135–145)
Total Bilirubin: 0.6 mg/dL (ref 0.3–1.2)
Total Protein: 7.5 g/dL (ref 6.5–8.1)

## 2020-12-28 LAB — CBC
HCT: 39.7 % (ref 36.0–46.0)
Hemoglobin: 13.5 g/dL (ref 12.0–15.0)
MCH: 29.1 pg (ref 26.0–34.0)
MCHC: 34 g/dL (ref 30.0–36.0)
MCV: 85.6 fL (ref 80.0–100.0)
Platelets: 279 10*3/uL (ref 150–400)
RBC: 4.64 MIL/uL (ref 3.87–5.11)
RDW: 15.3 % (ref 11.5–15.5)
WBC: 9 10*3/uL (ref 4.0–10.5)
nRBC: 0 % (ref 0.0–0.2)

## 2020-12-28 LAB — RESP PANEL BY RT-PCR (FLU A&B, COVID) ARPGX2
Influenza A by PCR: NEGATIVE
Influenza B by PCR: NEGATIVE
SARS Coronavirus 2 by RT PCR: NEGATIVE

## 2020-12-28 LAB — URINE DRUG SCREEN, QUALITATIVE (ARMC ONLY)
Amphetamines, Ur Screen: NOT DETECTED
Barbiturates, Ur Screen: NOT DETECTED
Benzodiazepine, Ur Scrn: NOT DETECTED
Cannabinoid 50 Ng, Ur ~~LOC~~: NOT DETECTED
Cocaine Metabolite,Ur ~~LOC~~: NOT DETECTED
MDMA (Ecstasy)Ur Screen: NOT DETECTED
Methadone Scn, Ur: NOT DETECTED
Opiate, Ur Screen: POSITIVE — AB
Phencyclidine (PCP) Ur S: NOT DETECTED
Tricyclic, Ur Screen: POSITIVE — AB

## 2020-12-28 LAB — ACETAMINOPHEN LEVEL: Acetaminophen (Tylenol), Serum: <10 ug/mL — ABNORMAL LOW (ref 10–30)

## 2020-12-28 LAB — SALICYLATE LEVEL: Salicylate Lvl: <7 mg/dL — ABNORMAL LOW (ref 7.0–30.0)

## 2020-12-28 LAB — ETHANOL: Alcohol, Ethyl (B): <10 mg/dL (ref <10)

## 2020-12-28 MED ORDER — TRAZODONE HCL 100 MG PO TABS
100.0000 mg | ORAL_TABLET | Freq: Every evening | ORAL | Status: DC | PRN
  Administered 2020-12-28 – 2021-01-05 (×8): 100 mg via ORAL
  Filled 2020-12-28 (×8): qty 1

## 2020-12-28 MED ORDER — HYDROXYZINE HCL 50 MG PO TABS
50.0000 mg | ORAL_TABLET | Freq: Two times a day (BID) | ORAL | Status: DC
  Administered 2020-12-28 – 2021-01-03 (×12): 50 mg via ORAL
  Filled 2020-12-28 (×12): qty 1

## 2020-12-28 MED ORDER — MAGNESIUM OXIDE -MG SUPPLEMENT 400 (240 MG) MG PO TABS
200.0000 mg | ORAL_TABLET | ORAL | Status: DC
  Administered 2020-12-29 – 2021-01-06 (×9): 200 mg via ORAL
  Filled 2020-12-28 (×9): qty 0.5

## 2020-12-28 MED ORDER — CALCIUM CARBONATE 1250 (500 CA) MG PO TABS
1.0000 | ORAL_TABLET | Freq: Every day | ORAL | Status: DC
  Administered 2020-12-29 – 2021-01-06 (×9): 500 mg via ORAL
  Filled 2020-12-28 (×9): qty 1

## 2020-12-28 MED ORDER — GABAPENTIN 100 MG PO CAPS
100.0000 mg | ORAL_CAPSULE | Freq: Two times a day (BID) | ORAL | Status: DC
  Administered 2020-12-29 – 2020-12-30 (×2): 100 mg via ORAL
  Filled 2020-12-28 (×2): qty 1

## 2020-12-28 MED ORDER — ALUM & MAG HYDROXIDE-SIMETH 200-200-20 MG/5ML PO SUSP: 30.0000 mL | ORAL | Status: DC | PRN

## 2020-12-28 MED ORDER — VITAMIN B-6 50 MG PO TABS
100.0000 mg | ORAL_TABLET | Freq: Every day | ORAL | Status: DC
  Administered 2020-12-29 – 2021-01-06 (×9): 100 mg via ORAL
  Filled 2020-12-28 (×9): qty 2

## 2020-12-28 MED ORDER — MELATONIN 5 MG PO TABS
10.0000 mg | ORAL_TABLET | Freq: Every day | ORAL | Status: DC
  Administered 2020-12-28 – 2021-01-05 (×9): 10 mg via ORAL
  Filled 2020-12-28 (×9): qty 2

## 2020-12-28 MED ORDER — PANTOPRAZOLE SODIUM 40 MG PO TBEC
40.0000 mg | DELAYED_RELEASE_TABLET | Freq: Every day | ORAL | Status: DC
  Administered 2020-12-29 – 2021-01-06 (×9): 40 mg via ORAL
  Filled 2020-12-28 (×9): qty 1

## 2020-12-28 MED ORDER — FLUVOXAMINE MALEATE 50 MG PO TABS
100.0000 mg | ORAL_TABLET | Freq: Every day | ORAL | Status: DC
  Administered 2020-12-28: 100 mg via ORAL
  Filled 2020-12-28: qty 2

## 2020-12-28 MED ORDER — ACETAMINOPHEN 325 MG PO TABS: 650.0000 mg | ORAL_TABLET | Freq: Four times a day (QID) | ORAL | Status: DC | PRN

## 2020-12-28 MED ORDER — POLYVINYL ALCOHOL 1.4 % OP SOLN
1.0000 [drp] | Freq: Every day | OPHTHALMIC | Status: DC | PRN
  Filled 2020-12-28: qty 15

## 2020-12-28 MED ORDER — MAGNESIUM HYDROXIDE 400 MG/5ML PO SUSP: 30.0000 mL | Freq: Every day | ORAL | Status: DC | PRN

## 2020-12-28 NOTE — ED Triage Notes (Signed)
Pt comes pov with psych eval. States that her and her husband haven't been getting along and she wants to take a bunch of her meds to hurt herself. She told her therapist and they recommended that she come here.

## 2020-12-28 NOTE — ED Notes (Signed)
Pt was unable to remove the ring on the fourth digit of her left hand.

## 2020-12-28 NOTE — ED Notes (Signed)
Pt belongings include a purse with phone, wallet. Clothing include two white shoes, two black socks, one green shirt, one pair black pants, one bra, one pair underwear, one yellow colored pin on her green shirt, one ring with red stone.

## 2020-12-28 NOTE — ED Notes (Signed)
This RN introduced self to pt, pt asking when she will be moved, pt reports feeling anxious. Pt informed this RN has not heard when she will be moved, dietary called for meal tray for pt, pt states she is unsure if she will have appetite to eat dinner, pt informed to let nurse know if any needs arise.
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
## 2020-12-28 NOTE — ED Provider Notes (Signed)
[facility] Emergency Department Provider Note   ____________________________________________   Event Date/Time   First MD Initiated Contact with Patient 12/28/20 1533     (approximate)  I have reviewed the triage vital signs and the nursing notes.   HISTORY  Chief Complaint Psychiatric Evaluation    HPI Joanna Mendoza is a 75 y.o. female with the below stated past medical history and significant for major depressive disorder who presents for psychiatric evaluation after stating she wanted to end her life by taking an overdose of hydroxyzine last night.  Patient states she has had increasing stress between her and her spouse over the last few months and that this is the cause of her increased depression.  Patient states she has attempted suicide before by overdose of medications and has required psychiatric hospitalization in the past as well.  Patient denies any ingestions.  Patient denies any illicit drug use or daily alcohol use.  Patient currently denies any homicidal ideation or auditory/visual hallucinations.         Past Medical History:  Diagnosis Date   Anxiety    Anxiety    Arthritis    Colon polyps    Depression    Epigastric pain    Fatty liver    Frequent PVCs    GERD (gastroesophageal reflux disease)    IBS (irritable bowel syndrome)    Morbid obesity (HCC)    Nausea    PSVT (paroxysmal supraventricular tachycardia) (HCC)    Restless legs    Status post rotator cuff repair     Patient Active Problem List   Diagnosis Date Noted   Degenerative disc disease, lumbar 12/30/2020   Osteopenia 12/30/2020   Cognitive impairment 12/30/2020   Severe recurrent major depression without psychotic features ([place]) 12/28/2020   Suicidal ideation 12/28/2020   PTSD (post-traumatic stress disorder) 12/28/2020   Primary osteoarthritis of left knee 02/09/2019   Renal cyst 07/30/2017   Urge incontinence 07/30/2017   Urinary urgency 07/30/2017    Urinary frequency 07/30/2017   Microhematuria 07/30/2017   Hx of adenomatous colonic polyps 07/08/2017   Other insomnia 11/22/2016   Frequent PVCs 08/22/2016   PSVT (paroxysmal supraventricular tachycardia) ([place]) 06/27/2016   Chronic RUQ pain 04/01/2014   Mixed hyperlipidemia 12/11/2013    Past Surgical History:  Procedure Laterality Date   ABDOMINAL HYSTERECTOMY     benign Left    breast mass removed   BREAST EXCISIONAL BIOPSY Left 2002   neg   BREAST SURGERY Left 2002   Breast Biopsy   CHOLECYSTECTOMY  2011   COLONOSCOPY WITH ESOPHAGOGASTRODUODENOSCOPY (EGD)     COLONOSCOPY WITH PROPOFOL N/A 08/07/2017   Procedure: COLONOSCOPY WITH PROPOFOL;  Surgeon: Manya Silvas, MD;  Location: [facility] ENDOSCOPY;  Service: Endoscopy;  Laterality: N/A;   ddd     ESOPHAGOGASTRODUODENOSCOPY (EGD) WITH PROPOFOL N/A 08/07/2017   Procedure: ESOPHAGOGASTRODUODENOSCOPY (EGD) WITH PROPOFOL;  Surgeon: Manya Silvas, MD;  Location: [facility] ENDOSCOPY;  Service: Endoscopy;  Laterality: N/A;   KNEE ARTHROSCOPY Left 2008   KNEE ARTHROSCOPY     LAPAROSCOPIC BILATERAL SALPINGO OOPHERECTOMY     OOPHORECTOMY     SHOULDER ACROMIOPLASTY Right 04/06/2015   Procedure: SUBACROMIAL DECOMPRESSION, ROTATOR CUFF REPAIR;  Surgeon: Dereck Leep, MD;  Location: [facility] ORS;  Service: Orthopedics;  Laterality: Right;    Prior to Admission medications   Medication Sig Start Date End Date Taking? Authorizing Provider  calcium carbonate (OS-CAL) 600 MG TABS tablet Take 600 mg by mouth daily.  [provider]  carboxymethylcellulose (REFRESH PLUS) 0.5 % SOLN Apply to eye.    [provider]  fluvoxaMINE (LUVOX) 100 MG tablet Take 100 mg by mouth at bedtime.    [provider]  gabapentin (NEURONTIN) 100 MG capsule Take 100 mg by mouth 2 (two) times daily. 07/04/17   [provider]  hydrOXYzine (VISTARIL) 50 MG capsule Take 50 mg by mouth 2 (two) times daily. 07/23/17   [provider]  Magnesium Oxide -Mg Supplement 250 MG TABS Take 250 mg by mouth every morning.     [provider]  Melatonin 5 MG CAPS Take 10 mg by mouth at bedtime.     [provider]  omeprazole (PRILOSEC) 40 MG capsule TK 1 C PO QD 05/17/18   [provider]  pyridOXINE (VITAMIN B-6) 100 MG tablet Take 100 mg by mouth daily.    [provider]  traZODone (DESYREL) 100 MG tablet Take by mouth. 09/15/19   [provider]    Allergies Celexa [citalopram], Cogentin [benztropine], Flagyl [metronidazole], Hydrocodone, and Adhesive [tape]  Family History  Problem Relation Age of Onset   Depression Mother    Depression Sister    Kidney disease Sister    Kidney failure Sister    Depression Brother    Breast cancer Neg Hx    Kidney cancer Neg Hx    Prostate cancer Neg Hx     Social History Social History   Tobacco Use   Smoking status: Never   Smokeless tobacco: Never  Vaping Use   Vaping Use: Never used  Substance Use Topics   Alcohol use: Yes    Comment: occ.   Drug use: No    Review of Systems Constitutional: No fever/chills Eyes: No visual changes. ENT: No sore throat. Cardiovascular: Denies chest pain. Respiratory: Denies shortness of breath. Gastrointestinal: No abdominal pain.  No nausea, no vomiting.  No diarrhea. Genitourinary: Negative for dysuria. Musculoskeletal: Negative for acute arthralgias Skin: Negative for rash. Neurological: Negative for headaches, weakness/numbness/paresthesias in any extremity Psychiatric: Negative for homicidal ideation/AVH   ____________________________________________   PHYSICAL EXAM:  VITAL SIGNS: ED Triage Vitals  Enc Vitals Group     BP 12/28/20 1511 (!) 163/96     Pulse Rate 12/28/20 1511 96     Resp 12/28/20 1511 20     Temp 12/28/20 1511 98.3 F (36.8 C)     Temp Source 12/28/20 1511 Oral     SpO2 12/28/20 1511 98 %     Weight 12/28/20 1511 150 lb (68 kg)      Height 12/28/20 1511 '4\' 11"'$  (1.499 m)     Head Circumference --      Peak Flow --      Pain Score 12/28/20 1514 0     Pain Loc --      Pain Edu? --      Excl. in [place]? --    Constitutional: Alert and oriented. Well appearing and in no acute distress. Eyes: Conjunctivae are normal. PERRL. Head: Atraumatic. Nose: No congestion/rhinnorhea. Mouth/Throat: Mucous membranes are moist. Neck: No stridor Cardiovascular: Grossly normal heart sounds.  Good peripheral circulation. Respiratory: Normal respiratory effort.  No retractions. Gastrointestinal: Soft and nontender. No distention. Musculoskeletal: No obvious deformities Neurologic:  Normal speech and language. No gross focal neurologic deficits are appreciated. Skin:  Skin is warm and dry. No rash noted. Psychiatric: Mood and affect are normal. Speech and behavior are normal.  ____________________________________________   LABS (all labs  ordered are listed, but only abnormal results are displayed)  Labs Reviewed  COMPREHENSIVE METABOLIC PANEL - Abnormal; Notable for the following components:      Result Value   Glucose, Bld 105 (*)    AST 44 (*)    All other components within normal limits  SALICYLATE LEVEL - Abnormal; Notable for the following components:   Salicylate Lvl Q000111Q (*)    All other components within normal limits  ACETAMINOPHEN LEVEL - Abnormal; Notable for the following components:   Acetaminophen (Tylenol), Serum <10 (*)    All other components within normal limits  URINE DRUG SCREEN, QUALITATIVE ([facility] ONLY) - Abnormal; Notable for the following components:   Tricyclic, Ur Screen POSITIVE (*)    Opiate, Ur Screen POSITIVE (*)    All other components within normal limits  RESP PANEL BY RT-PCR (FLU A&B, COVID) ARPGX2  ETHANOL  CBC   PROCEDURES  Procedure(s) performed (including Critical Care):  Procedures   ____________________________________________   INITIAL IMPRESSION / ASSESSMENT AND PLAN / ED  COURSE  As part of my medical decision making, I reviewed the following data within the electronic medical record, if available:  Nursing notes reviewed and incorporated, Labs reviewed, EKG interpreted, Old chart reviewed, Radiograph reviewed and Notes from prior ED visits reviewed and incorporated        Thoughts are linear and organized, and patient has no AH, VH, or HI. Prior suicide attempt by overdose with pills Prior Psychiatric Hospitalizations: Multiple  Clinically patient displays no overt toxidrome; they are well appearing, with low suspicion for toxic ingestion given history and exam. Thoughts unlikely 2/2 anemia, hypothyroidism, infection, or ICH.  Consult: Psychiatry to evaluate patient for potential hold for danger to self. Disposition: Plan admit to psychiatry for further management of symptoms.       ____________________________________________   FINAL CLINICAL IMPRESSION(S) / ED DIAGNOSES  Final diagnoses:  Suicidal ideation     ED Discharge Orders     None        Note:  This document was prepared using Dragon voice recognition software and may include unintentional dictation errors.    Naaman Plummer, MD 12/30/20 [phone]

## 2020-12-28 NOTE — ED Notes (Signed)
Pt given meal tray.

## 2020-12-29 ENCOUNTER — Encounter: Payer: Self-pay | Admitting: Psychiatry

## 2020-12-29 ENCOUNTER — Other Ambulatory Visit: Payer: Self-pay

## 2020-12-29 ENCOUNTER — Telehealth: Payer: Self-pay

## 2020-12-29 DIAGNOSIS — F332 Major depressive disorder, recurrent severe without psychotic features: Secondary | ICD-10-CM | POA: Diagnosis not present

## 2020-12-29 MED ORDER — FLUVOXAMINE MALEATE 50 MG PO TABS
150.0000 mg | ORAL_TABLET | Freq: Every day | ORAL | Status: DC
  Administered 2020-12-29 – 2021-01-05 (×8): 150 mg via ORAL
  Filled 2020-12-29 (×8): qty 3

## 2020-12-29 MED ORDER — DONEPEZIL HCL 5 MG PO TABS
5.0000 mg | ORAL_TABLET | Freq: Every day | ORAL | Status: DC
  Administered 2020-12-29 – 2021-01-02 (×5): 5 mg via ORAL
  Filled 2020-12-29 (×6): qty 1

## 2020-12-29 MED ORDER — METOPROLOL TARTRATE 25 MG PO TABS
12.5000 mg | ORAL_TABLET | Freq: Two times a day (BID) | ORAL | Status: DC
  Administered 2020-12-29 – 2021-01-06 (×16): 12.5 mg via ORAL
  Filled 2020-12-29 (×17): qty 1

## 2020-12-29 MED ORDER — METOPROLOL TARTRATE 25 MG PO TABS: 12.5000 mg | ORAL_TABLET | Freq: Two times a day (BID) | ORAL | Status: DC

## 2020-12-29 MED ORDER — MEMANTINE HCL 5 MG PO TABS: 5.0000 mg | ORAL_TABLET | Freq: Every day | ORAL | Status: DC

## 2020-12-29 NOTE — Progress Notes (Signed)
Admission Note:  75 yr Female who presents IVC in no acute distress for the treatment of Suicidal ideation and Depression. Patient upon arrival was receptive to staff, she appears flat and sad, she was noted anxious, restless and apprehensive. Patient was offered emotional support, and she was then calm and cooperative with admission process. Patient denies SI/HI/AVH and contracts for safety upon admission. Patient explained to writer that she was having worsening depression which made have her experience anhedonia; she was not taking care of herself. Patient has Past medical Hx of Depression, PTSD, Suicidal ideation, GERD, PSVT, Restless leg, Anxiety, IBS and Arthritis. Patient's skin was assessed in presence of Georga Hacking and found to be clear of any abnormal marks, in addition she was searched and no contraband found, POC and unit policies explained understanding verbalized, and consents obtained. Patient was oriented to the unit, 15 minutes safety checks maintained, will continue to monitor.

## 2020-12-29 NOTE — Telephone Encounter (Signed)
please call [place], lcsw in regards to this patient. ROI was sent today.

## 2020-12-29 NOTE — Telephone Encounter (Signed)
ROI received.

## 2020-12-29 NOTE — Tx Team (Addendum)
Initial Treatment Plan 12/29/2020 12:36 AM [name] Age [id number]    PATIENT STRESSORS: Marital or family conflict   PATIENT STRENGTHS: Communication skills Motivation for treatment/growth   PATIENT IDENTIFIED PROBLEMS: Depression     PTSD                  DISCHARGE CRITERIA:  Improved stabilization in mood, thinking, and/or behavior Motivation to continue treatment in a less acute level of care  PRELIMINARY DISCHARGE PLAN: Outpatient therapy  PATIENT/FAMILY INVOLVEMENT: This treatment plan has been presented to and reviewed with the patient, [name] Age, The patient and family have been given the opportunity to ask questions and make suggestions.  Harl Bowie, RN 12/29/2020, 12:36 AM

## 2020-12-29 NOTE — Progress Notes (Signed)
Pt states that she feels no different that she did this morning emotionally. She is somewhat social. She has rested this afternoon and then has been in the dayroom. She has been med compliant. Collier Bullock RN

## 2020-12-29 NOTE — [facility] Group Notes (Signed)
[facility] Group Notes:  (Nursing/MHT/Case Management/Adjunct)  Date:  12/29/2020  Time:  9:42 PM  Type of Therapy:  Group Therapy  Participation Level:  Active  Participation Quality:  Appropriate  Affect:  Appropriate  Cognitive:  Alert  Insight:  Good  Engagement in Group:  Engaged and her goal is to get medicine right.  Modes of Intervention:  Support  Summary of Progress/Problems:  Joanna Mendoza 12/29/2020, 9:42 PM

## 2020-12-29 NOTE — [facility] Group Notes (Signed)
LCSW Group Therapy Note  12/29/2020 12:30 PM  Type of Therapy/Topic:  Group Therapy:  Balance in Life  Participation Level:  Did Not Attend  Description of Group:    This group will address the concept of balance and how it feels and looks when one is unbalanced. Patients will be encouraged to process areas in their lives that are out of balance and identify reasons for remaining unbalanced. Facilitators will guide patients in utilizing problem-solving interventions to address and correct the stressor making their life unbalanced. Understanding and applying boundaries will be explored and addressed for obtaining and maintaining a balanced life. Patients will be encouraged to explore ways to assertively make their unbalanced needs known to significant others in their lives, using other group members and facilitator for support and feedback.  Therapeutic Goals: Patient will identify two or more emotions or situations they have that consume much of in their lives. Patient will identify signs/triggers that life has become out of balance:  Patient will identify two ways to set boundaries in order to achieve balance in their lives:  Patient will demonstrate ability to communicate their needs through discussion and/or role plays  Summary of Patient Progress: X  Therapeutic Modalities:   Cognitive Behavioral Therapy Solution-Focused Therapy Assertiveness Training  Assunta Curtis MSW, LCSW 12/29/2020 12:30 PM

## 2020-12-29 NOTE — [facility] Suicide Risk Assessment (Signed)
[facility] INPATIENT:  Family/Significant Other Suicide Prevention Education  Suicide Prevention Education:  Contact Attempts: Maris Berger, daughter, [phone] has been identified by the patient as the family member/significant other with whom the patient will be residing, and identified as the person(s) who will aid the patient in the event of a mental health crisis.  With written consent from the patient, two attempts were made to provide suicide prevention education, prior to and/or following the patient's discharge.  We were unsuccessful in providing suicide prevention education.  A suicide education pamphlet was given to the patient to share with family/significant other.  Date and time of first attempt:12/29/2020/11:30AM Date and time of second attempt:Second attempt is needed.   CSW left HIPAA compliant voicemail.  Rozann Lesches 12/29/2020, 11:28 AM

## 2020-12-29 NOTE — Progress Notes (Signed)
Recreation Therapy Notes   Date: 12/29/2020  Time: 10:15 am   Location: Courtyard   Behavioral response: N/A   Intervention Topic: Leisure  Discussion/Intervention: Patient did not attend group.   Clinical Observations/Feedback:  Patient did not attend group.   Corona Popovich LRT/CTRS        Armarion Greek 12/29/2020 11:08 AM
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
## 2020-12-29 NOTE — Progress Notes (Signed)
Patient calm and compliant during assessment denying SI/HI/AVH. Patient endorses anxiety. Patient given support. Patient observed interacting appropriately with staff and peers on the unit. Pt compliant with medication administration per MD orders. Patient being monitored Q 15 minutes for safety per unit protocol. Pt remains safe on the unit .

## 2020-12-29 NOTE — Telephone Encounter (Signed)
Left voice message to contact the office.

## 2020-12-29 NOTE — Progress Notes (Signed)
Around 1200 pt said that she felt "shaky". After feeling for her pulse I did her VS on [employer]. See record. MD was notified. New orders were given. Collier Bullock RN
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
## 2020-12-29 NOTE — B[facility] Suicide Risk Assessment (Signed)
[facility] Admission Suicide Risk Assessment   Nursing information obtained from:  Patient Demographic factors:  Caucasian, Age 75 or older Current Mental Status:  NA Loss Factors:  Decline in physical health Historical Factors:  Family history of mental illness or substance abuse, Impulsivity Risk Reduction Factors:  Religious beliefs about death, Living with another person, especially a relative, Positive therapeutic relationship, Positive social support  Total Time spent with patient: 1 hour Principal Problem: Severe recurrent major depression without psychotic features ([place]) Diagnosis:  Principal Problem:   Severe recurrent major depression without psychotic features ([place]) Active Problems:   Suicidal ideation   PTSD (post-traumatic stress disorder)  Subjective Data: 75 year old female with MDD, recurrent, severe without psychotic features presenting for worsening mood and poor self-function. See H&P for details.   Continued Clinical Symptoms:  Alcohol Use Disorder Identification Test Final Score (AUDIT): 0 The "Alcohol Use Disorders Identification Test", Guidelines for Use in Primary Care, Second Edition.  World Pharmacologist [facility]). Score between 0-7:  no or low risk or alcohol related problems. Score between 8-15:  moderate risk of alcohol related problems. Score between 16-19:  high risk of alcohol related problems. Score 20 or above:  warrants further diagnostic evaluation for alcohol dependence and treatment.   CLINICAL FACTORS:   Severe Anxiety and/or Agitation Depression:   Anhedonia Comorbid alcohol abuse/dependence Hopelessness Insomnia Severe Alcohol/Substance Abuse/Dependencies More than one psychiatric diagnosis Previous Psychiatric Diagnoses and Treatments   Musculoskeletal: Strength & Muscle Tone: within normal limits Gait & Station: normal Patient leans: N/A  Psychiatric Specialty Exam:  Presentation  General Appearance: Appropriate for  Environment  Eye Contact:Good  Speech:Clear and Coherent  Speech Volume:Normal  Handedness: No data recorded  Mood and Affect  Mood:Depressed  Affect:Flat   Thought Process  Thought Processes:Coherent  Descriptions of Associations:Intact  Orientation:Full (Time, Place and Person)  Thought Content:WDL  History of Schizophrenia/Schizoaffective disorder:No data recorded Duration of Psychotic Symptoms:No data recorded Hallucinations:Hallucinations: None (Denies, none observed)  Ideas of Reference:None (Denies, none observed)  Suicidal Thoughts:Suicidal Thoughts: No (Denies)  Homicidal Thoughts:Homicidal Thoughts: No (Denies)   Sensorium  Memory:Remote Poor; Recent Poor; Immediate Poor  Judgment:Fair  Insight:Fair   Executive Functions  Concentration: No data recorded Attention Span:Fair  Recall:Poor  Fund of Knowledge:Fair  Language:Fair   Psychomotor Activity  Psychomotor Activity:Psychomotor Activity: Normal   Assets  Assets:Leisure Time; Desire for Improvement; Financial Resources/Insurance; Housing; Social Support; Resilience; Physical Health   Sleep  Sleep:Sleep: Poor Number of Hours of Sleep: 4.75    Physical Exam: Physical Exam ROS Blood pressure (!) 145/80, pulse (!) 102, temperature 98.7 F (37.1 C), temperature source Oral, resp. rate 18, height '4\' 11"'$  (1.499 m), weight 67.6 kg, SpO2 96 %. Body mass index is 30.09 kg/m.   COGNITIVE FEATURES THAT CONTRIBUTE TO RISK:  Loss of executive function and Thought constriction (tunnel vision)    SUICIDE RISK:   Mild:  Suicidal ideation of limited frequency, intensity, duration, and specificity.  There are no identifiable plans, no associated intent, mild dysphoria and related symptoms, good self-control (both objective and subjective assessment), few other risk factors, and identifiable protective factors, including available and accessible social support.  PLAN OF CARE: Continue  inpatient admission, see H&P for details.   I certify that inpatient services furnished can reasonably be expected to improve the patient's condition.   Salley Scarlet, MD 12/29/2020, 11:40 AM

## 2020-12-29 NOTE — H&P (Addendum)
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  Physician Treatment Plan for Secondary Diagnosis: Principal Problem:   Severe recurrent major depression without psychotic features ([place])  Long Term Goal(s): Improvement in symptoms so as ready for discharge  Short Term Goals: Ability to identify changes in lifestyle to reduce recurrence of condition will improve, Ability to verbalize feelings will improve, Ability to disclose and discuss suicidal ideas, Ability to demonstrate self-control will improve, Ability to identify and develop effective coping behaviors will improve, Ability to maintain clinical measurements within normal limits will improve, and Compliance with prescribed medications will  improve  I certify that inpatient services furnished can reasonably be expected to improve the patient's condition.    Sherlon Handing, NP 7/28/202211:16 AM

## 2020-12-29 NOTE — Plan of Care (Signed)
Patient endorses anxiety  Problem: Self-Concept: Goal: Level of anxiety will decrease Outcome: Not Progressing

## 2020-12-30 DIAGNOSIS — F332 Major depressive disorder, recurrent severe without psychotic features: Principal | ICD-10-CM

## 2020-12-30 DIAGNOSIS — M5136 Other intervertebral disc degeneration, lumbar region: Secondary | ICD-10-CM | POA: Insufficient documentation

## 2020-12-30 DIAGNOSIS — M8580 Other specified disorders of bone density and structure, unspecified site: Secondary | ICD-10-CM | POA: Insufficient documentation

## 2020-12-30 DIAGNOSIS — R4189 Other symptoms and signs involving cognitive functions and awareness: Secondary | ICD-10-CM | POA: Diagnosis present

## 2020-12-30 MED ORDER — GABAPENTIN 100 MG PO CAPS
200.0000 mg | ORAL_CAPSULE | Freq: Every day | ORAL | Status: DC
  Administered 2020-12-31 – 2021-01-05 (×6): 200 mg via ORAL
  Filled 2020-12-30 (×6): qty 2

## 2020-12-30 NOTE — Progress Notes (Signed)
Recreation Therapy Notes  Date: 12/30/2020  Time: 10:00 am  Location: Craft room    Behavioral response: Appropriate   Intervention Topic: Creative Expression   Discussion/Intervention:  Group content on today was focused on creative expressions. The group defined creative expressions and ways they use creative expressions. Individual identified other positive ways creative expressions can be used and why it is important to express yourself. Patients participated in the intervention "expressive shading", where they had a chance to creatively express themselves. Clinical Observations/Feedback: Patient came to group and identified smiling and exercising as ways she participate in creative expressions. Individual was social with peers and staff while participating in the intervention.  Sanaia Jasso LRT/CTRS         Cedric Denison 12/30/2020 11:57 AM

## 2020-12-30 NOTE — Plan of Care (Signed)
Patient rated her depression and anxiety 5/10 this morning. Stated that she slept good last night. Found patient lying in the bed sad and tearful. Patient states " I am not good enough. I don't feel like doing anything at home.I just watch TV." Denies SI,HI and AVH. Did not attend group.Appetite good. Support and encouragement given.

## 2020-12-30 NOTE — Progress Notes (Signed)
[facility] MD Progress Note  12/30/2020 11:50 AM Joanna Mendoza  MRN:  [id number]  CC "Need to get the right medications."  Subjective: 75 year old female presenting for worsening depression and poor self function.  No acute events overnight, medication compliant, attending to ADLs.  Patient seen during treatment team and again one-on-one this morning.  She states her goals are to get on the right medications, and thiamine a marriage counselor, and perhaps a new provider.  When seen one-on-one patient is lying in bed somewhat tearful.  She notes that she was thinking about her husband and started to feel very sad.  She notes that he continues to smoke marijuana despite her not approving.  He also walks their dog while she feels like she is always stuck at home.  She notes that they have been married for 20 years and he continues to be a stressor for her.  She has a very supportive sister with whom she stays when she needs to "get away".  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  She feels the medication she is currently on are somewhat helpful and that she is out of bed more during the day and sleeping better at night.  She denies any suicidal ideations, homicidal ideations, visual hallucinations, auditory hallucinations.  She wishes to continue her current regimen without changes at this time to see if they will continue to improve her mood.  At this time she does not feel safe returning home given her continued depression.  Principal Problem: Severe recurrent major depression without psychotic features ([place]) Diagnosis: Principal Problem:   Severe recurrent major depression without psychotic features ([place]) Active Problems:   Suicidal ideation   PTSD (post-traumatic stress disorder)   Cognitive impairment  Total Time spent with patient: 30 minutes  Past Psychiatric History: See H&P  Past Medical History:  Past Medical History:   Diagnosis Date   Anxiety    Anxiety    Arthritis    Colon polyps    Depression    Epigastric pain    Fatty liver    Frequent PVCs    GERD (gastroesophageal reflux disease)    IBS (irritable bowel syndrome)    Morbid obesity (HCC)    Nausea    PSVT (paroxysmal supraventricular tachycardia) (HCC)    Restless legs    Status post rotator cuff repair     Past Surgical History:  Procedure Laterality Date   ABDOMINAL HYSTERECTOMY     benign Left    breast mass removed   BREAST EXCISIONAL BIOPSY Left 2002   neg   BREAST SURGERY Left 2002   Breast Biopsy   CHOLECYSTECTOMY  2011   COLONOSCOPY WITH ESOPHAGOGASTRODUODENOSCOPY (EGD)     COLONOSCOPY WITH PROPOFOL N/A 08/07/2017   Procedure: COLONOSCOPY WITH PROPOFOL;  Surgeon: Manya Silvas, MD;  [facility] ENDOSCOPY;  Service: Endoscopy;  Laterality: N/A;   ddd     ESOPHAGOGASTRODUODENOSCOPY (EGD) WITH PROPOFOL N/A 08/07/2017   Procedure: ESOPHAGOGASTRODUODENOSCOPY (EGD) WITH PROPOFOL;  Surgeon: Manya Silvas, MD;  Location: [facility] ENDOSCOPY;  Service: Endoscopy;  Laterality: N/A;   KNEE ARTHROSCOPY Left 2008   KNEE ARTHROSCOPY     LAPAROSCOPIC BILATERAL SALPINGO OOPHERECTOMY     OOPHORECTOMY     SHOULDER ACROMIOPLASTY Right 04/06/2015   Procedure: SUBACROMIAL DECOMPRESSION, ROTATOR CUFF REPAIR;  Surgeon: Dereck Leep, MD;  Location: [facility] ORS;  Service: Orthopedics;  Laterality: Right;   Family  History:  Family History  Problem Relation Age of Onset   Depression Mother    Depression Sister    Kidney disease Sister    Kidney failure Sister    Depression Brother    Breast cancer Neg Hx    Kidney cancer Neg Hx    Prostate cancer Neg Hx    Family Psychiatric  History: See H&P Social History:  Social History   Substance and Sexual Activity  Alcohol Use Yes   Comment: occ.     Social History   Substance and Sexual Activity  Drug Use No    Social History   Socioeconomic History   Marital status: Married     Spouse name: Not on file   Number of children: Not on file   Years of education: Not on file   Highest education level: 10th grade  Occupational History   Not on file  Tobacco Use   Smoking status: Never   Smokeless tobacco: Never  Vaping Use   Vaping Use: Never used  Substance and Sexual Activity   Alcohol use: Yes    Comment: occ.   Drug use: No   Sexual activity: Not Currently  Other Topics Concern   Not on file  Social History Narrative   Not on file   Social Determinants of Health   Financial Resource Strain: Not on file  Food Insecurity: Not on file  Transportation Needs: Not on file  Physical Activity: Not on file  Stress: Not on file  Social Connections: Not on file   Additional Social History:                         Sleep: Fair  Appetite:  Fair  Current Medications: Current Facility-Administered Medications  Medication Dose Route Frequency Provider Last Rate Last Admin   acetaminophen (TYLENOL) tablet 650 mg  650 mg Oral Q6H PRN Clapacs, Madie Reno, MD       alum & mag hydroxide-simeth (MAALOX/MYLANTA) 200-200-20 MG/5ML suspension 30 mL  30 mL Oral Q4H PRN Clapacs, John T, MD       calcium carbonate (OS-CAL - dosed in mg of elemental calcium) tablet 500 mg of elemental calcium  1 tablet Oral Daily Clapacs, John T, MD   500 mg of elemental calcium at 12/30/20 0803   donepezil (ARICEPT) tablet 5 mg  5 mg Oral QHS Salley Scarlet, MD   5 mg at 12/29/20 2146   fluvoxaMINE (LUVOX) tablet 150 mg  150 mg Oral QHS Salley Scarlet, MD   150 mg at 12/29/20 2127   gabapentin (NEURONTIN) capsule 100 mg  100 mg Oral BID Clapacs, John T, MD   100 mg at 12/30/20 [id number]   hydrOXYzine (ATARAX/VISTARIL) tablet 50 mg  50 mg Oral BID Clapacs, John T, MD   50 mg at 12/30/20 [id number]   magnesium hydroxide (MILK OF MAGNESIA) suspension 30 mL  30 mL Oral Daily PRN Clapacs, John T, MD       magnesium oxide (MAG-OX) tablet 200 mg  200 mg Oral BH-q7a Clapacs, John T, MD   200 mg at  12/30/20 0646   melatonin tablet 10 mg  10 mg Oral QHS Clapacs, John T, MD   10 mg at 12/29/20 2126   metoprolol tartrate (LOPRESSOR) tablet 12.5 mg  12.5 mg Oral BID Salley Scarlet, MD   12.5 mg at 12/30/20 0803   pantoprazole (PROTONIX) EC tablet 40 mg  40 mg Oral Daily Clapacs,  Madie Reno, MD   40 mg at 12/30/20 [id number]   polyvinyl alcohol (LIQUIFILM TEARS) 1.4 % ophthalmic solution 1 drop  1 drop Both Eyes Daily PRN Clapacs, Madie Reno, MD       pyridOXINE (VITAMIN B-6) tablet 100 mg  100 mg Oral Daily Clapacs, John T, MD   100 mg at 12/30/20 0809   traZODone (DESYREL) tablet 100 mg  100 mg Oral QHS PRN Clapacs, Madie Reno, MD   100 mg at 12/29/20 2127    Lab Results:  Results for orders placed or performed during the hospital encounter of 12/28/20 (from the past 48 hour(s))  Comprehensive metabolic panel     Status: Abnormal   Collection Time: 12/28/20  3:17 PM  Result Value Ref Range   Sodium 137 135 - 145 mmol/L   Potassium 4.7 3.5 - 5.1 mmol/L   Chloride 104 98 - 111 mmol/L   CO2 24 22 - 32 mmol/L   Glucose, Bld 105 (H) 70 - 99 mg/dL    Comment: Glucose reference range applies only to samples taken after fasting for at least 8 hours.   BUN 16 8 - 23 mg/dL   Creatinine, Ser 0.77 0.44 - 1.00 mg/dL   Calcium 9.4 8.9 - 10.3 mg/dL   Total Protein 7.5 6.5 - 8.1 g/dL   Albumin 4.1 3.5 - 5.0 g/dL   AST 44 (H) 15 - 41 U/L   ALT 37 0 - 44 U/L   Alkaline Phosphatase 44 38 - 126 U/L   Total Bilirubin 0.6 0.3 - 1.2 mg/dL   GFR, Estimated >60 >60 mL/min    Comment: (NOTE) Calculated using the CKD-EPI Creatinine Equation (2021)    Anion gap 9 5 - 15    Comment: Performed at [facility], [address] [place], [place] [address]  Ethanol     Status: None   Collection Time: 12/28/20  3:17 PM  Result Value Ref Range   Alcohol, Ethyl (B) <10 <10 mg/dL    Comment: (NOTE) Lowest detectable limit for serum alcohol is 10 mg/dL.  For medical purposes only. Performed at [facility], [address] [place], [place] [id number]   Salicylate level     Status: Abnormal   Collection Time: 12/28/20  3:17 PM  Result Value Ref Range   Salicylate Lvl [id number] (L) 7.0 - 30.0 mg/dL    Comment: Performed at [facility], [address], [place] [address]  Acetaminophen level     Status: Abnormal   Collection Time: 12/28/20  3:17 PM  Result Value Ref Range   Acetaminophen (Tylenol), Serum <10 (L) 10 - 30 ug/mL    Comment: (NOTE) Therapeutic concentrations vary significantly. A range of 10-30 ug/mL  may be an effective concentration for many patients. However, some  are best treated at concentrations outside of this range. Acetaminophen concentrations >150 ug/mL at 4 hours after ingestion  and >50 ug/mL at 12 hours after ingestion are often associated with  toxic reactions.  Performed at [facility], [address] [place], [place] [address]   cbc     Status: None   Collection Time: 12/28/20  3:17 PM  Result Value Ref Range   WBC 9.0 4.0 - 10.5 K/uL   RBC 4.64 3.87 - 5.11 MIL/uL   Hemoglobin 13.5 12.0 - 15.0 g/dL   HCT 39.7 36.0 - 46.0 %   MCV 85.6 80.0 - 100.0 fL   MCH 29.1 26.0 - 34.0 pg  MCHC 34.0 30.0 - 36.0 g/dL   RDW 15.3 11.5 - 15.5 %   Platelets 279 150 - 400 K/uL   nRBC 0.0 0.0 - 0.2 %    Comment: Performed at [facility], [address] [place], [place] [address]  Urine Drug Screen, Qualitative     Status: Abnormal   Collection Time: 12/28/20  3:18 PM  Result Value Ref Range   Tricyclic, Ur Screen POSITIVE (A) NONE DETECTED   Amphetamines, Ur Screen NONE DETECTED NONE DETECTED   MDMA (Ecstasy)Ur Screen NONE DETECTED NONE DETECTED   Cocaine Metabolite,Ur [place] NONE DETECTED NONE DETECTED   Opiate, Ur Screen POSITIVE (A) NONE DETECTED   Phencyclidine (PCP) Ur S NONE DETECTED NONE DETECTED   Cannabinoid 50 Ng, Ur [place] NONE DETECTED NONE DETECTED   Barbiturates, Ur Screen NONE DETECTED NONE DETECTED    Benzodiazepine, Ur Scrn NONE DETECTED NONE DETECTED   Methadone Scn, Ur NONE DETECTED NONE DETECTED    Comment: (NOTE) Tricyclics + metabolites, urine    Cutoff 1000 ng/mL Amphetamines + metabolites, urine  Cutoff 1000 ng/mL MDMA (Ecstasy), urine              Cutoff 500 ng/mL Cocaine Metabolite, urine          Cutoff 300 ng/mL Opiate + metabolites, urine        Cutoff 300 ng/mL Phencyclidine (PCP), urine         Cutoff 25 ng/mL Cannabinoid, urine                 Cutoff 50 ng/mL Barbiturates + metabolites, urine  Cutoff 200 ng/mL Benzodiazepine, urine              Cutoff 200 ng/mL Methadone, urine                   Cutoff 300 ng/mL  The urine drug screen provides only a preliminary, unconfirmed analytical test result and should not be used for non-medical purposes. Clinical consideration and professional judgment should be applied to any positive drug screen result due to possible interfering substances. A more specific alternate chemical method must be used in order to obtain a confirmed analytical result. Gas chromatography / mass spectrometry (GC/MS) is the preferred confirm atory method. Performed at [facility], [place], [place] [address]   Resp Panel by RT-PCR (Flu A&B, Covid) Nasopharyngeal Swab     Status: None   Collection Time: 12/28/20  4:28 PM   Specimen: Nasopharyngeal Swab; Nasopharyngeal(NP) swabs in vial transport medium  Result Value Ref Range   SARS Coronavirus 2 by RT PCR NEGATIVE NEGATIVE    Comment: (NOTE) SARS-CoV-2 target nucleic acids are NOT DETECTED.  The SARS-CoV-2 RNA is generally detectable in upper respiratory specimens during the acute phase of infection. The lowest concentration of SARS-CoV-2 viral copies this assay can detect is 138 copies/mL. A negative result does not preclude SARS-Cov-2 infection and should not be used as the sole basis for treatment or other patient management decisions. A negative result may occur  with  improper specimen collection/handling, submission of specimen other than nasopharyngeal swab, presence of viral mutation(s) within the areas targeted by this assay, and inadequate number of viral copies(<138 copies/mL). A negative result must be combined with clinical observations, patient history, and epidemiological information. The expected result is Negative.  Fact Sheet for Patients:  EntrepreneurPulse.com.au  Fact Sheet for Healthcare Providers:  IncredibleEmployment.be  This test is no t yet approved or cleared by the  [place] Architectural technologist and  has been authorized for detection and/or diagnosis of SARS-CoV-2 by FDA under an Print production planner (EUA). This EUA will remain  in effect (meaning this test can be used) for the duration of the COVID-19 declaration under Section 564(b)(1) of the Act, 21 U.S.C.section 360bbb-3(b)(1), unless the authorization is terminated  or revoked sooner.       Influenza A by PCR NEGATIVE NEGATIVE   Influenza B by PCR NEGATIVE NEGATIVE    Comment: (NOTE) The Xpert Xpress SARS-CoV-2/FLU/RSV plus assay is intended as an aid in the diagnosis of influenza from Nasopharyngeal swab specimens and should not be used as a sole basis for treatment. Nasal washings and aspirates are unacceptable for Xpert Xpress SARS-CoV-2/FLU/RSV testing.  Fact Sheet for Patients: EntrepreneurPulse.com.au  Fact Sheet for Healthcare Providers: IncredibleEmployment.be  This test is not yet approved or cleared by the [place] FDA and has been authorized for detection and/or diagnosis of SARS-CoV-2 by FDA under an Emergency Use Authorization (EUA). This EUA will remain in effect (meaning this test can be used) for the duration of the COVID-19 declaration under Section 564(b)(1) of the Act, 21 U.S.C. section 360bbb-3(b)(1), unless the authorization is terminated or revoked.  Performed  at [facility], [address] [place], [place] [address]     Blood Alcohol level:  Lab Results  Component Value Date   [facility] <10 [id number]    Metabolic Disorder Labs: No results found for: HGBA1C, MPG No results found for: PROLACTIN No results found for: CHOL, TRIG, HDL, CHOLHDL, VLDL, LDLCALC  Physical Findings: AIMS:  , ,  ,  ,    CIWA:    COWS:     Musculoskeletal: Strength & Muscle Tone: within normal limits Gait & Station: normal Patient leans: N/A  Psychiatric Specialty Exam:  Presentation  General Appearance: Appropriate for Environment  Eye Contact:Good  Speech:Clear and Coherent  Speech Volume:Normal  Handedness: No data recorded  Mood and Affect  Mood:Depressed  Affect:Flat   Thought Process  Thought Processes:Coherent  Descriptions of Associations:Intact  Orientation:Full (Time, Place and Person)  Thought Content:WDL  History of Schizophrenia/Schizoaffective disorder:No data recorded Duration of Psychotic Symptoms:No data recorded Hallucinations:Hallucinations: None (Denies, none observed)  Ideas of Reference:None (Denies, none observed)  Suicidal Thoughts:Suicidal Thoughts: No (Denies)  Homicidal Thoughts:Homicidal Thoughts: No (Denies)   Sensorium  Memory:Remote Poor; Recent Poor; Immediate Poor  Judgment:Fair  Insight:Fair   Executive Functions  Concentration: No data recorded Attention Span:Fair  Recall:Poor  Fund of Knowledge:Fair  Language:Fair   Psychomotor Activity  Psychomotor Activity:Psychomotor Activity: Normal   Assets  Assets:Leisure Time; Desire for Improvement; Financial Resources/Insurance; Housing; Social Support; Resilience; Physical Health   Sleep  Sleep:Sleep: Poor Number of Hours of Sleep: 4.75    Physical Exam: Physical Exam ROS Blood pressure 122/63, pulse 95, temperature 97.7 F (36.5 C), temperature source Oral, resp. rate 18, height '4\' 11"'$  (1.499 m), weight 67.6  kg, SpO2 97 %. Body mass index is 30.09 kg/m.   Treatment Plan Summary: Daily contact with patient to assess and evaluate symptoms and progress in treatment and Medication management  Depression - Continue Luvox 150 mg daily at bedtime   Anxiety - Shift gabapentin 200 mg QHS per request --Continue hydroxyzine 50 mg p.o. 2 times daily   Supplements - Continue Vitamin B6 tablet 100 mg p.o. daily - Continue calcium carbonate 500 mg tablet of elemental calcium p.o. daily - Continue magnesium oxide tablet 200 mg p.o. each morning   Hypertension/sinus tachycardia -  Continue metoprolol tartrate tablet 12.5 mg p.o. 2 times daily  GERD - Continue Protonix EC tablet 40 mg p.o. daily  Insomnia - Continue trazodone tablet 100 mg p.o. at bedtime as needed - Melatonin tablet 10 mg p.o. daily at bedtime  PRN, Other --Continue Tylenol 650 mg po every 6 hrs prn pain --Continue MAALOX/MYLANTA 30 mL po every 4 hrs prn indigestion --Continue Milk of Magnesia 30 mL po daily prn constipation    -Continue Liquifilm Tears 1.4% ophthalmic solution 1 drop both eyes daily as needed for dry eyes   Memory impairments - Administer Montreal Cognitive Assessment (MoCA) and proceed with referrals subsequent to outcome. Score 17- Indicates Moderate cognitive impairment Start Aricept 5 mg QHS  Salley Scarlet, MD 12/30/2020, 11:50 AM

## 2020-12-30 NOTE — Progress Notes (Signed)
Recreation Therapy Notes  INPATIENT RECREATION TR PLAN  Patient Details Name: Joanna Mendoza MRN: [id number] DOB: 1945/06/28 Today's Date: 12/30/2020  Rec Therapy Plan Is patient appropriate for Therapeutic Recreation?: Yes Treatment times per week: at least 3 Estimated Length of Stay: 5-7 days TR Treatment/Interventions: Group participation (Comment)  Discharge Criteria Pt will be discharged from therapy if:: Discharged Treatment plan/goals/alternatives discussed and agreed upon by:: Patient/family  Discharge Summary     Kemesha Mosey 12/30/2020, 1:49 PM

## 2020-12-30 NOTE — Tx Team (Signed)
Interdisciplinary Treatment and Diagnostic Plan Update  12/30/2020 Time of Session: 0900 Joanna Mendoza MRN: [id number]  Principal Diagnosis: Severe recurrent major depression without psychotic features [facility])  Secondary Diagnoses: Principal Problem:   Severe recurrent major depression without psychotic features ([place]) Active Problems:   Suicidal ideation   PTSD (post-traumatic stress disorder)   Current Medications:  Current Facility-Administered Medications  Medication Dose Route Frequency Provider Last Rate Last Admin   acetaminophen (TYLENOL) tablet 650 mg  650 mg Oral Q6H PRN Clapacs, Madie Reno, MD       alum & mag hydroxide-simeth (MAALOX/MYLANTA) 200-200-20 MG/5ML suspension 30 mL  30 mL Oral Q4H PRN Clapacs, John T, MD       calcium carbonate (OS-CAL - dosed in mg of elemental calcium) tablet 500 mg of elemental calcium  1 tablet Oral Daily Clapacs, John T, MD   500 mg of elemental calcium at 12/30/20 0803   donepezil (ARICEPT) tablet 5 mg  5 mg Oral QHS Salley Scarlet, MD   5 mg at 12/29/20 2146   fluvoxaMINE (LUVOX) tablet 150 mg  150 mg Oral QHS Salley Scarlet, MD   150 mg at 12/29/20 2127   gabapentin (NEURONTIN) capsule 100 mg  100 mg Oral BID Clapacs, John T, MD   100 mg at 12/30/20 [id number]   hydrOXYzine (ATARAX/VISTARIL) tablet 50 mg  50 mg Oral BID Clapacs, John T, MD   50 mg at 12/30/20 [id number]   magnesium hydroxide (MILK OF MAGNESIA) suspension 30 mL  30 mL Oral Daily PRN Clapacs, John T, MD       magnesium oxide (MAG-OX) tablet 200 mg  200 mg Oral BH-q7a Clapacs, John T, MD   200 mg at 12/30/20 0646   melatonin tablet 10 mg  10 mg Oral QHS Clapacs, John T, MD   10 mg at 12/29/20 2126   metoprolol tartrate (LOPRESSOR) tablet 12.5 mg  12.5 mg Oral BID Salley Scarlet, MD   12.5 mg at 12/30/20 0803   pantoprazole (PROTONIX) EC tablet 40 mg  40 mg Oral Daily Clapacs, John T, MD   40 mg at 12/30/20 [id number]   polyvinyl alcohol (LIQUIFILM TEARS) 1.4 % ophthalmic solution 1 drop  1  drop Both Eyes Daily PRN Clapacs, John T, MD       pyridOXINE (VITAMIN B-6) tablet 100 mg  100 mg Oral Daily Clapacs, John T, MD   100 mg at 12/30/20 0809   traZODone (DESYREL) tablet 100 mg  100 mg Oral QHS PRN Clapacs, John T, MD   100 mg at 12/29/20 2127   PTA Medications: Medications Prior to Admission  Medication Sig Dispense Refill Last Dose   calcium carbonate (OS-CAL) 600 MG TABS tablet Take 600 mg by mouth daily.      carboxymethylcellulose (REFRESH PLUS) 0.5 % SOLN Apply to eye.      fluvoxaMINE (LUVOX) 100 MG tablet Take 100 mg by mouth at bedtime.      gabapentin (NEURONTIN) 100 MG capsule Take 100 mg by mouth 2 (two) times daily.  11    hydrOXYzine (VISTARIL) 50 MG capsule Take 50 mg by mouth 2 (two) times daily.  1    Magnesium Oxide -Mg Supplement 250 MG TABS Take 250 mg by mouth every morning.       Melatonin 5 MG CAPS Take 10 mg by mouth at bedtime.       omeprazole (PRILOSEC) 40 MG capsule TK 1 C PO QD      pyridOXINE (  VITAMIN B-6) 100 MG tablet Take 100 mg by mouth daily.      traZODone (DESYREL) 100 MG tablet Take by mouth.       Patient Stressors: Marital or family conflict  Patient Strengths: Agricultural engineer for treatment/growth  Treatment Modalities: Medication Management, Group therapy, Case management,  1 to 1 session with clinician, Psychoeducation, Recreational therapy.   Physician Treatment Plan for Primary Diagnosis: Severe recurrent major depression without psychotic features ([place]) Long Term Goal(s): Improvement in symptoms so as ready for discharge   Short Term Goals: Ability to identify changes in lifestyle to reduce recurrence of condition will improve Ability to verbalize feelings will improve Ability to disclose and discuss suicidal ideas Ability to demonstrate self-control will improve Ability to identify and develop effective coping behaviors will improve Ability to maintain clinical measurements within normal limits will  improve Compliance with prescribed medications will improve  Medication Management: Evaluate patient's response, side effects, and tolerance of medication regimen.  Therapeutic Interventions: 1 to 1 sessions, Unit Group sessions and Medication administration.  Evaluation of Outcomes: Progressing  Physician Treatment Plan for Secondary Diagnosis: Principal Problem:   Severe recurrent major depression without psychotic features ([place]) Active Problems:   Suicidal ideation   PTSD (post-traumatic stress disorder)  Long Term Goal(s): Improvement in symptoms so as ready for discharge   Short Term Goals: Ability to identify changes in lifestyle to reduce recurrence of condition will improve Ability to verbalize feelings will improve Ability to disclose and discuss suicidal ideas Ability to demonstrate self-control will improve Ability to identify and develop effective coping behaviors will improve Ability to maintain clinical measurements within normal limits will improve Compliance with prescribed medications will improve     Medication Management: Evaluate patient's response, side effects, and tolerance of medication regimen.  Therapeutic Interventions: 1 to 1 sessions, Unit Group sessions and Medication administration.  Evaluation of Outcomes: Progressing   RN Treatment Plan for Primary Diagnosis: Severe recurrent major depression without psychotic features ([place]) Long Term Goal(s): Knowledge of disease and therapeutic regimen to maintain health will improve  Short Term Goals: Ability to remain free from injury will improve, Ability to verbalize frustration and anger appropriately will improve, Ability to demonstrate self-control, Ability to participate in decision making will improve, Ability to verbalize feelings will improve, Ability to disclose and discuss suicidal ideas, Ability to identify and develop effective coping behaviors will improve, and Compliance with prescribed  medications will improve  Medication Management: RN will administer medications as ordered by provider, will assess and evaluate patient's response and provide education to patient for prescribed medication. RN will report any adverse and/or side effects to prescribing provider.  Therapeutic Interventions: 1 on 1 counseling sessions, Psychoeducation, Medication administration, Evaluate responses to treatment, Monitor vital signs and CBGs as ordered, Perform/monitor CIWA, COWS, AIMS and Fall Risk screenings as ordered, Perform wound care treatments as ordered.  Evaluation of Outcomes: Progressing   LCSW Treatment Plan for Primary Diagnosis: Severe recurrent major depression without psychotic features ([place]) Long Term Goal(s): Safe transition to appropriate next level of care at discharge, Engage patient in therapeutic group addressing interpersonal concerns.  Short Term Goals: Engage patient in aftercare planning with referrals and resources, Increase social support, Increase ability to appropriately verbalize feelings, Increase emotional regulation, Facilitate acceptance of mental health diagnosis and concerns, Facilitate patient progression through stages of change regarding substance use diagnoses and concerns, Identify triggers associated with mental health/substance abuse issues, and Increase skills for wellness and recovery  Therapeutic Interventions: Assess  for all discharge needs, 1 to 1 time with Education officer, museum, Explore available resources and support systems, Assess for adequacy in community support network, Educate family and significant other(s) on suicide prevention, Complete Psychosocial Assessment, Interpersonal group therapy.  Evaluation of Outcomes: Progressing   Progress in Treatment: Attending groups: No. Participating in groups: No. Taking medication as prescribed: Yes. Toleration medication: Yes. Family/Significant other contact made: Yes, individual(s) contacted:  SPE  completed with patient and collateral.  Patient understands diagnosis: Yes. Discussing patient identified problems/goals with staff: Yes. Medical problems stabilized or resolved: Yes. Denies suicidal/homicidal ideation: Yes. Issues/concerns per patient self-inventory: Yes. Other: none   New problem(s) identified: No, Describe:  no additional problems identified at this time.   New Short Term/Long Term Goal(s): detox, elimination of symptoms of psychosis, medication management for mood stabilization; elimination of SI thoughts; development of comprehensive mental wellness plan.  Patient Goals:  "get the right kind of medicine"   Discharge Plan or Barriers: no barriers foreseen at this time.   Reason for Continuation of Hospitalization: Anxiety Depression  Estimated Length of Stay:  Attendees: Patient: Joanna Mendoza 12/30/2020 9:13 AM  Physician: Selina Cooley, MD 12/30/2020 9:13 AM  Nursing: Tyler Pita, RN  12/30/2020 9:13 AM  RN Care Manager: 12/30/2020 9:13 AM  Social Worker: Paulla Dolly, MSW, [place], [place]  12/30/2020 9:13 AM  Recreational Therapist: Roanna Epley, Reather Converse, LRT  12/30/2020 9:13 AM  Other: Assunta Curtis, MSW, LCSW 12/30/2020 9:13 AM  Other:  12/30/2020 9:13 AM  Other: 12/30/2020 9:13 AM    Scribe for Treatment Team: [name] Hurt, [place] 12/30/2020 9:13 AM

## 2020-12-30 NOTE — Progress Notes (Signed)
Recreation Therapy Notes  INPATIENT RECREATION THERAPY ASSESSMENT  Patient Details Name: Joanna Mendoza MRN: [id number] DOB: 1945-12-08 Today's Date: 12/30/2020       Information Obtained From: Patient  Able to Participate in Assessment/Interview: Yes  Patient Presentation: Responsive  Reason for Admission (Per Patient): Active Symptoms  Patient Stressors: Relationship, Death  Coping Skills:   Prayer, Talk  Leisure Interests (2+):  Exercise - Walking (Pet dog)  Frequency of Recreation/Participation: Monthly  Awareness of Community Resources:  Yes  Community Resources:  Church  Current Use: No  If no, Barriers?: Attitudinal  Expressed Interest in [place]: Yes  County of Residence:  [place] under[place]  Patient Main Form of Transportation: Musician  Patient Strengths:  Good listener  Patient Identified Areas of Improvement:  Find more things to do  Patient Goal for Hospitalization:  To get the right medication  Current SI (including self-harm):  No  Current HI:  No  Current AVH: No  Staff Intervention Plan: Group Attendance, Collaborate with Interdisciplinary Treatment Team  Consent to Intern Participation: N/A  Joanna Mendoza 12/30/2020, 1:47 PM

## 2020-12-30 NOTE — [facility] Group Notes (Signed)
LCSW Group Therapy Note     12/30/2020 2:00 PM  Type of Therapy and Topic:  Group Therapy:  Feelings around Relapse and Recovery  Participation Level:  Did Not Attend  Description of Group:    Patients in this group will discuss emotions they experience before and after a relapse. They will process how experiencing these feelings, or avoidance of experiencing them, relates to having a relapse. Facilitator will guide patients to explore emotions they have related to recovery. Patients will be encouraged to process which emotions are more powerful. They will be guided to discuss the emotional reaction significant others in their lives may have to their relapse or recovery. Patients will be assisted in exploring ways to respond to the emotions of others without this contributing to a relapse.     Therapeutic Goals:  1.    Patient will identify two or more emotions that lead to a relapse for them 2.    Patient will identify two emotions that result when they relapse 3.    Patient will identify two emotions related to recovery 4.    Patient will demonstrate ability to communicate their needs through discussion and/or role plays    Summary of Patient Progress: Patient did not attend group despite encouraged participation.      Therapeutic Modalities:  Cognitive Behavioral Therapy Solution-Focused Therapy Assertiveness Training Relapse Prevention Therapy    Paulla Dolly, LCSW, LCAS-A  12/30/2020 2:00 PM

## 2020-12-31 DIAGNOSIS — F332 Major depressive disorder, recurrent severe without psychotic features: Secondary | ICD-10-CM | POA: Diagnosis not present

## 2020-12-31 MED ORDER — HYDROXYZINE HCL 25 MG PO TABS
25.0000 mg | ORAL_TABLET | Freq: Four times a day (QID) | ORAL | Status: DC | PRN
  Administered 2020-12-31 – 2021-01-02 (×4): 25 mg via ORAL
  Filled 2020-12-31 (×4): qty 1

## 2020-12-31 NOTE — Progress Notes (Signed)
Spoke to the on call provider covering the weekend (Dr. Weber Cooks) to review patient presentation (endorsing anxiousness). Orders received for '25mg'$  oral Q6 PRN vistaril for anxiety. Will give for comfort and safety of the patient.

## 2020-12-31 NOTE — Plan of Care (Signed)
Patient during assessments this morning observed up at the nursing station requesting PRN medicine for anxiety and picking the skin on her nose just prior to our engagement. Pt. During our engagement endorsed that her anxiety has worsened today and would like as needed medications for relief, as currently her only coping skills for her anxiety at this time are picking at her skin or taking medicine. Despite endorsed increase in anxiety, she endorses that her mood has overall improved "some". She endorsed that she has been tolerating her medicines appropriately. She denied physical pain. Her orientation appeared grossly intact. Her thought process was grossly intact to conversation. She denied si/hi/avh, expressed ability to remain safe on the unit.    Patient has been complaint with medications and unit procedures thus far. Pt. Has been observed eating good thus far. Pt. Has been able to remain safe on the unit thus far. Pt. Has been observed resting in her room a majority of the day thus far, but has been observed sociable during mealtimes with peers.   Q x 15 minute observation checks in place/maintained for safety. Patient is provided with education throughout shift when appropriate and able.  Patient is given/offered medications per orders. Patient is encouraged to attend groups, participate in unit activities and continue with plan of care. Pt. Chart and plans of care reviewed. Pt. Given support and encouragement when appropriate and able.     Problem: Health Behavior/Discharge Planning: Goal: Compliance with treatment plan for underlying cause of condition will improve Outcome: Progressing Note: Patient has been complaint with medications and unit procedures.    Problem: Coping: Goal: Ability to demonstrate self-control will improve Outcome: Not Progressing Note: Patient has demonstrate poor self control today, observed up at the nursing station requesting PRN medicine for anxiety, utilizing  poor coping skills (I.e., picking at her nose).    Problem: Education: Goal: Emotional status will improve Outcome: Progressing Note: Patient endorses small improvement overall today in her mood.

## 2020-12-31 NOTE — Progress Notes (Signed)
Reported that she is feeling better this evening with a decrease in anxiety and depression. She denies SI/HI/AVH.  She is med compliant and received her QHS meds without incident. She has been active on the unit hanging in the dayroom watching TV and spending time with other women similar in age. She is safe on the unit with Q 15 min safety checks. Encouraged to come to staff with any concerns.        Cleo Butler-Nicholson, LPN

## 2020-12-31 NOTE — Progress Notes (Signed)
[facility] MD Progress Note  12/31/2020 12:48 PM Joanna Mendoza  MRN:  [id number] Subjective: Follow-up patient with anxiety and depression.  Complaining of worsening anxiety today and asking for more medicine.  Nothing particular bothering her.  Picks at her nose regularly and is having trouble controlling it.  No complaints about medicine.  No new labs.  Vitals unremarkable Principal Problem: Severe recurrent major depression without psychotic features ([place]) Diagnosis: Principal Problem:   Severe recurrent major depression without psychotic features ([place]) Active Problems:   Suicidal ideation   PTSD (post-traumatic stress disorder)   Cognitive impairment  Total Time spent with patient: 30 minutes  Past Psychiatric History: History of chronic and recurrent anxiety and depression  Past Medical History:  Past Medical History:  Diagnosis Date   Anxiety    Anxiety    Arthritis    Colon polyps    Depression    Epigastric pain    Fatty liver    Frequent PVCs    GERD (gastroesophageal reflux disease)    IBS (irritable bowel syndrome)    Morbid obesity (HCC)    Nausea    PSVT (paroxysmal supraventricular tachycardia) (HCC)    Restless legs    Status post rotator cuff repair     Past Surgical History:  Procedure Laterality Date   ABDOMINAL HYSTERECTOMY     benign Left    breast mass removed   BREAST EXCISIONAL BIOPSY Left 2002   neg   BREAST SURGERY Left 2002   Breast Biopsy   CHOLECYSTECTOMY  2011   COLONOSCOPY WITH ESOPHAGOGASTRODUODENOSCOPY (EGD)     COLONOSCOPY WITH PROPOFOL N/A 08/07/2017   Procedure: COLONOSCOPY WITH PROPOFOL;  Surgeon: Manya Silvas, MD;  Location: [facility] END[facility]COPY;  Service: Endoscopy;  Laterality: N/A;   ddd     ESOPHAGOGASTRODUODENOSCOPY (EGD) WITH PROPOFOL N/A 08/07/2017   Procedure: ESOPHAGOGASTRODUODENOSCOPY (EGD) WITH PROPOFOL;  Surgeon: Manya Silvas, MD;  Location: [facility] ENDOSCOPY;  Service: Endoscopy;  Laterality: N/A;   KNEE ARTHROSCOPY Left  2008   KNEE ARTHROSCOPY     LAPAROSCOPIC BILATERAL SALPINGO OOPHERECTOMY     OOPHORECTOMY     SHOULDER ACROMIOPLASTY Right 04/06/2015   Procedure: SUBACROMIAL DECOMPRESSION, ROTATOR CUFF REPAIR;  Surgeon: Dereck Leep, MD;  Location: [facility] ORS;  Service: Orthopedics;  Laterality: Right;   Family History:  Family History  Problem Relation Age of Onset   Depression Mother    Depression Sister    Kidney disease Sister    Kidney failure Sister    Depression Brother    Breast cancer Neg Hx    Kidney cancer Neg Hx    Prostate cancer Neg Hx    Family Psychiatric  History: See previous Social History:  Social History   Substance and Sexual Activity  Alcohol Use Yes   Comment: occ.     Social History   Substance and Sexual Activity  Drug Use No    Social History   Socioeconomic History   Marital status: Married    Spouse name: Not on file   Number of children: Not on file   Years of education: Not on file   Highest education level: 10th grade  Occupational History   Not on file  Tobacco Use   Smoking status: Never   Smokeless tobacco: Never  Vaping Use   Vaping Use: Never used  Substance and Sexual Activity   Alcohol use: Yes    Comment: occ.   Drug use: No   Sexual activity: Not Currently  Other  Topics Concern   Not on file  Social History Narrative   Not on file   Social Determinants of Health   Financial Resource Strain: Not on file  Food Insecurity: Not on file  Transportation Needs: Not on file  Physical Activity: Not on file  Stress: Not on file  Social Connections: Not on file   Additional Social History:                         Sleep: Poor  Appetite:  Fair  Current Medications: Current Facility-Administered Medications  Medication Dose Route Frequency Provider Last Rate Last Admin   acetaminophen (TYLENOL) tablet 650 mg  650 mg Oral Q6H PRN Levoy Geisen, Madie Reno, MD       alum & mag hydroxide-simeth (MAALOX/MYLANTA) 200-200-20 MG/5ML  suspension 30 mL  30 mL Oral Q4H PRN Tyonna Talerico T, MD       calcium carbonate (OS-CAL - dosed in mg of elemental calcium) tablet 500 mg of elemental calcium  1 tablet Oral Daily Dillon Mcreynolds T, MD   500 mg of elemental calcium at 12/31/20 0736   donepezil (ARICEPT) tablet 5 mg  5 mg Oral QHS Salley Scarlet, MD   5 mg at 12/30/20 2136   fluvoxaMINE (LUVOX) tablet 150 mg  150 mg Oral QHS Salley Scarlet, MD   150 mg at 12/30/20 2136   gabapentin (NEURONTIN) capsule 200 mg  200 mg Oral QHS Salley Scarlet, MD       hydrOXYzine (ATARAX/VISTARIL) tablet 25 mg  25 mg Oral Q6H PRN Evangelene Vora T, MD   25 mg at 12/31/20 [id number]   hydrOXYzine (ATARAX/VISTARIL) tablet 50 mg  50 mg Oral BID Uday Jantz T, MD   50 mg at 12/31/20 0736   magnesium hydroxide (MILK OF MAGNESIA) suspension 30 mL  30 mL Oral Daily PRN Rika Daughdrill T, MD       magnesium oxide (MAG-OX) tablet 200 mg  200 mg Oral BH-q7a Ehtan Delfavero T, MD   200 mg at 12/31/20 0736   melatonin tablet 10 mg  10 mg Oral QHS Tashawna Thom T, MD   10 mg at 12/30/20 2136   metoprolol tartrate (LOPRESSOR) tablet 12.5 mg  12.5 mg Oral BID Salley Scarlet, MD   12.5 mg at 12/31/20 0736   pantoprazole (PROTONIX) EC tablet 40 mg  40 mg Oral Daily Tokiko Diefenderfer T, MD   40 mg at 12/31/20 [id number]   polyvinyl alcohol (LIQUIFILM TEARS) 1.4 % ophthalmic solution 1 drop  1 drop Both Eyes Daily PRN Euell Schiff T, MD       pyridOXINE (VITAMIN B-6) tablet 100 mg  100 mg Oral Daily Malaka Ruffner T, MD   100 mg at 12/31/20 0736   traZODone (DESYREL) tablet 100 mg  100 mg Oral QHS PRN Norvel Wenker, Madie Reno, MD   100 mg at 12/30/20 2136    Lab Results: No results found for this or any previous visit (from the past 48 hour(s)).  Blood Alcohol level:  Lab Results  Component Value Date   ETH <10 [id number]    Metabolic Disorder Labs: No results found for: HGBA1C, MPG No results found for: PROLACTIN No results found for: CHOL, TRIG, HDL, CHOLHDL, VLDL,  LDLCALC  Physical Findings: AIMS:  , ,  ,  ,    CIWA:    COWS:     Musculoskeletal: Strength & Muscle Tone: within normal limits Gait & Station: normal Patient  leans: N/A  Psychiatric Specialty Exam:  Presentation  General Appearance: Appropriate for Environment  Eye Contact:Good  Speech:Clear and Coherent  Speech Volume:Normal  Handedness: No data recorded  Mood and Affect  Mood:Depressed  Affect:Flat   Thought Process  Thought Processes:Coherent  Descriptions of Associations:Intact  Orientation:Full (Time, Place and Person)  Thought Content:WDL  History of Schizophrenia/Schizoaffective disorder:No data recorded Duration of Psychotic Symptoms:No data recorded Hallucinations:No data recorded Ideas of Reference:None (Denies, none observed)  Suicidal Thoughts:No data recorded Homicidal Thoughts:No data recorded  Sensorium  Memory:Remote Poor; Recent Poor; Immediate Poor  Judgment:Fair  Insight:Fair   Executive Functions  Concentration: No data recorded Attention Span:Fair  Recall:Poor  Fund of Knowledge:Fair  Language:Fair   Psychomotor Activity  Psychomotor Activity: No data recorded  Assets  Assets:Leisure Time; Desire for Improvement; Financial Resources/Insurance; Housing; Social Support; Resilience; Physical Health   Sleep  Sleep: No data recorded   Physical Exam: Physical Exam Vitals and nursing note reviewed.  Constitutional:      Appearance: Normal appearance.  HENT:     Head: Normocephalic and atraumatic.     Mouth/Throat:     Pharynx: Oropharynx is clear.  Eyes:     Pupils: Pupils are equal, round, and reactive to light.  Cardiovascular:     Rate and Rhythm: Normal rate and regular rhythm.  Pulmonary:     Effort: Pulmonary effort is normal.     Breath sounds: Normal breath sounds.  Abdominal:     General: Abdomen is flat.     Palpations: Abdomen is soft.  Musculoskeletal:        General: Normal range of  motion.  Skin:    General: Skin is warm and dry.       Neurological:     General: No focal deficit present.     Mental Status: She is alert. Mental status is at baseline.  Psychiatric:        Attention and Perception: Attention normal.        Mood and Affect: Mood is anxious and depressed.        Speech: Speech is delayed.        Behavior: Behavior is slowed and withdrawn.        Thought Content: Thought content normal. Thought content does not include suicidal ideation.        Cognition and Memory: Cognition normal.        Judgment: Judgment normal.   Review of Systems  Constitutional: Negative.   HENT: Negative.    Eyes: Negative.   Respiratory: Negative.    Cardiovascular: Negative.   Gastrointestinal: Negative.   Musculoskeletal: Negative.   Skin: Negative.   Neurological: Negative.   Psychiatric/Behavioral:  Positive for depression. The patient is nervous/anxious.   Blood pressure 114/65, pulse 79, temperature 98.3 F (36.8 C), temperature source Oral, resp. rate 18, height '4\' 11"'$  (1.499 m), weight 67.6 kg, SpO2 97 %. Body mass index is 30.09 kg/m.   Treatment Plan Summary: Plan added some as needed hydroxyzine although unclear if this is helping with the acute anxiety.  May consider adding gabapentin as a as needed later.  Encourage patient to attend groups and talk with staff to try and work on elucidating where her anxiety is coming from.  No other change to medication.  Alethia Berthold, MD 12/31/2020, 12:48 PM

## 2020-12-31 NOTE — Plan of Care (Signed)
  Problem: Education: Goal: Ability to state activities that reduce stress will improve Outcome: Progressing   Problem: Coping: Goal: Ability to identify and develop effective coping behavior will improve Outcome: Progressing   Problem: Education: Goal: Knowledge of [place] [facility] Education information/materials will improve Outcome: Progressing Goal: Emotional status will improve Outcome: Progressing Goal: Mental status will improve Outcome: Progressing Goal: Verbalization of understanding the information provided will improve Outcome: Progressing   Problem: Health Behavior/Discharge Planning: Goal: Identification of resources available to assist in meeting health care needs will improve Outcome: Progressing Goal: Compliance with treatment plan for underlying cause of condition will improve Outcome: Progressing   Problem: Coping: Goal: Ability to verbalize frustrations and anger appropriately will improve Outcome: Progressing Goal: Ability to demonstrate self-control will improve Outcome: Progressing   Problem: Physical Regulation: Goal: Ability to maintain clinical measurements within normal limits will improve Outcome: Progressing   Problem: Safety: Goal: Periods of time without injury will increase Outcome: Progressing

## 2020-12-31 NOTE — [facility] Group Notes (Addendum)
  [facility] LCSW Group Therapy Note  Date/Time:  12/31/2020 2:00-2:30 PM   Type of Therapy and Topic:  Group Therapy:  Feelings About Hospitalization  Participation Level:  Minimal   Description of Group This process group involved patients discussing their feelings related to being hospitalized, as well as the benefits they see to being in the hospital.  These feelings and benefits were itemized.  The group then brainstormed specific ways in which they could seek those same benefits when they discharge and return home.  Therapeutic Goals Patient will identify and describe positive and negative feelings related to hospitalization Patient will verbalize benefits of hospitalization to themselves personally Patients will brainstorm together ways they can obtain similar benefits in the outpatient setting, identify barriers to wellness and possible solutions  Summary of Patient Progress: Patient came in towards the end of group. Patient stated she would like to have support or someone to talk to about PTSD when she leaves the hospital.      Therapeutic Modalities Cognitive [place], [place] 12/31/2020  2:47 PM

## 2021-01-01 DIAGNOSIS — F332 Major depressive disorder, recurrent severe without psychotic features: Secondary | ICD-10-CM | POA: Diagnosis not present

## 2021-01-01 NOTE — Progress Notes (Signed)
[facility] MD Progress Note  01/01/2021 12:07 PM Joanna Mendoza  MRN:  [id number] Subjective: Follow-up for patient with severe depression.  Patient says she is feeling no better than yesterday.  Still feels depressed and anxious.  Has a hard time breaking it down much farther than that.  Stays to her room and stays by herself for the most part.  Denies any suicidal intent but endorses some feeling of hopelessness although she says that she thinks that if she could get marriage counseling it might help.  No new physical complaints. Principal Problem: Severe recurrent major depression without psychotic features ([place]) Diagnosis: Principal Problem:   Severe recurrent major depression without psychotic features ([place]) Active Problems:   Suicidal ideation   PTSD (post-traumatic stress disorder)   Cognitive impairment  Total Time spent with patient: 30 minutes  Past Psychiatric History: Past history of recurrent depression and anxiety  Past Medical History:  Past Medical History:  Diagnosis Date   Anxiety    Anxiety    Arthritis    Colon polyps    Depression    Epigastric pain    Fatty liver    Frequent PVCs    GERD (gastroesophageal reflux disease)    IBS (irritable bowel syndrome)    Morbid obesity (HCC)    Nausea    PSVT (paroxysmal supraventricular tachycardia) (HCC)    Restless legs    Status post rotator cuff repair     Past Surgical History:  Procedure Laterality Date   ABDOMINAL HYSTERECTOMY     benign Left    breast mass removed   BREAST EXCISIONAL BIOPSY Left 2002   neg   BREAST SURGERY Left 2002   Breast Biopsy   CHOLECYSTECTOMY  2011   COLONOSCOPY WITH ESOPHAGOGASTRODUODENOSCOPY (EGD)     COLONOSCOPY WITH PROPOFOL N/A 08/07/2017   Procedure: COLONOSCOPY WITH PROPOFOL;  Surgeon: Manya Silvas, MD;  Location: [facility] ENDOSCOPY;  Service: Endoscopy;  Laterality: N/A;   ddd     ESOPHAGOGASTRODUODENOSCOPY (EGD) WITH PROPOFOL N/A 08/07/2017   Procedure:  ESOPHAGOGASTRODUODENOSCOPY (EGD) WITH PROPOFOL;  Surgeon: Manya Silvas, MD;  Location: [facility] ENDOSCOPY;  Service: Endoscopy;  Laterality: N/A;   KNEE ARTHROSCOPY Left 2008   KNEE ARTHROSCOPY     LAPAROSCOPIC BILATERAL SALPINGO OOPHERECTOMY     OOPHORECTOMY     SHOULDER ACROMIOPLASTY Right 04/06/2015   Procedure: SUBACROMIAL DECOMPRESSION, ROTATOR CUFF REPAIR;  Surgeon: Dereck Leep, MD;  Location: [facility] ORS;  Service: Orthopedics;  Laterality: Right;   Family History:  Family History  Problem Relation Age of Onset   Depression Mother    Depression Sister    Kidney disease Sister    Kidney failure Sister    Depression Brother    Breast cancer Neg Hx    Kidney cancer Neg Hx    Prostate cancer Neg Hx    Family Psychiatric  History: See previous Social History:  Social History   Substance and Sexual Activity  Alcohol Use Yes   Comment: occ.     Social History   Substance and Sexual Activity  Drug Use No    Social History   Socioeconomic History   Marital status: Married    Spouse name: Not on file   Number of children: Not on file   Years of education: Not on file   Highest education level: 10th grade  Occupational History   Not on file  Tobacco Use   Smoking status: Never   Smokeless tobacco: Never  Vaping Use  Vaping Use: Never used  Substance and Sexual Activity   Alcohol use: Yes    Comment: occ.   Drug use: No   Sexual activity: Not Currently  Other Topics Concern   Not on file  Social History Narrative   Not on file   Social Determinants of Health   Financial Resource Strain: Not on file  Food Insecurity: Not on file  Transportation Needs: Not on file  Physical Activity: Not on file  Stress: Not on file  Social Connections: Not on file   Additional Social History:                         Sleep: Fair  Appetite:  Fair  Current Medications: Current Facility-Administered Medications  Medication Dose Route Frequency Provider Last  Rate Last Admin   acetaminophen (TYLENOL) tablet 650 mg  650 mg Oral Q6H PRN Franciso Dierks, Madie Reno, MD       alum & mag hydroxide-simeth (MAALOX/MYLANTA) 200-200-20 MG/5ML suspension 30 mL  30 mL Oral Q4H PRN Tracina Beaumont T, MD       calcium carbonate (OS-CAL - dosed in mg of elemental calcium) tablet 500 mg of elemental calcium  1 tablet Oral Daily Meosha Castanon T, MD   500 mg of elemental calcium at 01/01/21 0746   donepezil (ARICEPT) tablet 5 mg  5 mg Oral QHS Salley Scarlet, MD   5 mg at 12/31/20 2111   fluvoxaMINE (LUVOX) tablet 150 mg  150 mg Oral QHS Salley Scarlet, MD   150 mg at 12/31/20 2109   gabapentin (NEURONTIN) capsule 200 mg  200 mg Oral QHS Salley Scarlet, MD   200 mg at 12/31/20 2110   hydrOXYzine (ATARAX/VISTARIL) tablet 25 mg  25 mg Oral Q6H PRN Marcelus Dubberly T, MD   25 mg at 12/31/20 2109   hydrOXYzine (ATARAX/VISTARIL) tablet 50 mg  50 mg Oral BID Czar Ysaguirre T, MD   50 mg at 01/01/21 0746   magnesium hydroxide (MILK OF MAGNESIA) suspension 30 mL  30 mL Oral Daily PRN Maclovia Uher T, MD       magnesium oxide (MAG-OX) tablet 200 mg  200 mg Oral BH-q7a Oceana Walthall T, MD   200 mg at 01/01/21 0746   melatonin tablet 10 mg  10 mg Oral QHS Welma Mccombs T, MD   10 mg at 12/31/20 2109   metoprolol tartrate (LOPRESSOR) tablet 12.5 mg  12.5 mg Oral BID Salley Scarlet, MD   12.5 mg at 01/01/21 0746   pantoprazole (PROTONIX) EC tablet 40 mg  40 mg Oral Daily Ledon Weihe T, MD   40 mg at 01/01/21 0746   polyvinyl alcohol (LIQUIFILM TEARS) 1.4 % ophthalmic solution 1 drop  1 drop Both Eyes Daily PRN Korey Arroyo T, MD       pyridOXINE (VITAMIN B-6) tablet 100 mg  100 mg Oral Daily Zenaya Ulatowski T, MD   100 mg at 01/01/21 0746   traZODone (DESYREL) tablet 100 mg  100 mg Oral QHS PRN Dorwin Fitzhenry, Madie Reno, MD   100 mg at 12/31/20 2110    Lab Results: No results found for this or any previous visit (from the past 48 hour(s)).  Blood Alcohol level:  Lab Results  Component Value Date    ETH <10 [id number]    Metabolic Disorder Labs: No results found for: HGBA1C, MPG No results found for: PROLACTIN No results found for: CHOL, TRIG, HDL, CHOLHDL, VLDL, LDLCALC  Physical Findings: AIMS:  , ,  ,  ,    CIWA:    COWS:     Musculoskeletal: Strength & Muscle Tone: within normal limits Gait & Station: normal Patient leans: N/A  Psychiatric Specialty Exam:  Presentation  General Appearance: Appropriate for Environment  Eye Contact:Good  Speech:Clear and Coherent  Speech Volume:Normal  Handedness: No data recorded  Mood and Affect  Mood:Depressed  Affect:Flat   Thought Process  Thought Processes:Coherent  Descriptions of Associations:Intact  Orientation:Full (Time, Place and Person)  Thought Content:WDL  History of Schizophrenia/Schizoaffective disorder:No data recorded Duration of Psychotic Symptoms:No data recorded Hallucinations:No data recorded Ideas of Reference:None (Denies, none observed)  Suicidal Thoughts:No data recorded Homicidal Thoughts:No data recorded  Sensorium  Memory:Remote Poor; Recent Poor; Immediate Poor  Judgment:Fair  Insight:Fair   Executive Functions  Concentration: No data recorded Attention Span:Fair  Recall:Poor  Fund of Knowledge:Fair  Language:Fair   Psychomotor Activity  Psychomotor Activity: No data recorded  Assets  Assets:Leisure Time; Desire for Improvement; Financial Resources/Insurance; Housing; Social Support; Resilience; Physical Health   Sleep  Sleep: No data recorded   Physical Exam: Physical Exam Vitals and nursing note reviewed.  Constitutional:      Appearance: Normal appearance.  HENT:     Head: Normocephalic and atraumatic.     Mouth/Throat:     Pharynx: Oropharynx is clear.  Eyes:     Pupils: Pupils are equal, round, and reactive to light.  Cardiovascular:     Rate and Rhythm: Normal rate and regular rhythm.  Pulmonary:     Effort: Pulmonary effort is normal.      Breath sounds: Normal breath sounds.  Abdominal:     General: Abdomen is flat.     Palpations: Abdomen is soft.  Musculoskeletal:        General: Normal range of motion.  Skin:    General: Skin is warm and dry.  Neurological:     General: No focal deficit present.     Mental Status: She is alert. Mental status is at baseline.  Psychiatric:        Attention and Perception: Attention normal.        Mood and Affect: Mood is anxious and depressed.        Speech: Speech is delayed.        Behavior: Behavior is slowed.        Thought Content: Thought content normal.        Cognition and Memory: Cognition is impaired. Memory is impaired.   Review of Systems  Constitutional: Negative.   HENT: Negative.    Eyes: Negative.   Respiratory: Negative.    Cardiovascular: Negative.   Gastrointestinal: Negative.   Musculoskeletal: Negative.   Skin: Negative.   Neurological: Negative.   Psychiatric/Behavioral:  Positive for depression. Negative for substance abuse and suicidal ideas. The patient is nervous/anxious.   Blood pressure (!) 147/69, pulse 84, temperature 97.9 F (36.6 C), temperature source Oral, resp. rate 18, height '4\' 11"'$  (1.499 m), weight 67.6 kg, SpO2 99 %. Body mass index is 30.09 kg/m.   Treatment Plan Summary: Plan plan to continue current medication.  Supportive counseling.  Encouragement to get out of bed and be more interactive on the unit.  No new labs today.  Vitals stable.  Alethia Berthold, MD 01/01/2021, 12:07 PM

## 2021-01-01 NOTE — Progress Notes (Signed)
Patient was cooperative with treatment, she did endorse anxiety and depression due to marital issues when she talked to Probation officer. She was compliant with medication regime on shift. She denies SI/HI&AVH. She appeared to rest well in bed through out the night.

## 2021-01-01 NOTE — Plan of Care (Addendum)
Patient during assessments this morning endorsed an improved mood from yesterday, reporting she feels less overall anxious and stress thus far. Her affect was congruent to mood overall, though still presents with an anxious edge and mildly constricted. She Denied si/hi/avh and endorsed ability to continue to remain safe on the unit. Her Orientation appeared grossly intact. She Denied physical pain and endorsed toleration of medications thus far. She Endorsed no problems with sleeping and or eating at this time. She Had no complaints for this Probation officer. Her thought process was appreciably grossly intact to our conversation, overall superficial.   Patient has been complaint with medications and unit procedures thus far. Pt. Has been observed eating good thus far. Pt. Has been able to remain safe on the unit thus far. Pt. Outside of meals has been observed resting in her room and or out in the milieu watching tv or sociable with peers. Pt. Has not been observed skin picking today. Pt. Encouraged to wash face and gently cleanse bridge or her nose with soap and water.   Q x 15 minute observation checks in place/maintained for safety. Patient is provided with education throughout shift when appropriate and able.  Patient is given/offered medications per orders. Patient is encouraged to attend groups, participate in unit activities and continue with plan of care. Pt. Chart and plans of care reviewed. Pt. Given support and encouragement when appropriate and able.     Problem: Self-Concept: Goal: Level of anxiety will decrease Outcome: Progressing Note: Endorses anxiety has improved some today thus far.    Problem: Education: Goal: Emotional status will improve Outcome: Progressing Note: Pt. Endorsed improved some in her mood today thus far.   Goal: Mental status will improve Outcome: Progressing Note: Pt. Endorsed improved some in her mood today thus far.    Problem: Health Behavior/Discharge  Planning: Goal: Compliance with treatment plan for underlying cause of condition will improve Outcome: Progressing Note: Pt. Has been complaint with medications and unit procedures.    Problem: Safety: Goal: Periods of time without injury will increase Outcome: Progressing Note: Pt. Denied si/hi/avh, endorsed an ability to continue to remain safe on the unit.

## 2021-01-01 NOTE — [facility] Group Notes (Signed)
[facility] LCSW Group Therapy Note  Date/Time:  01/01/2021 2:16 PM- 3:07 PM   Type of Therapy and Topic:  Group Therapy:  Healthy and Unhealthy Supports  Participation Level:  Active   Description of Group:  Patients in this group were introduced to the idea of adding a variety of healthy supports to address the various needs in their lives.Patients discussed what additional healthy supports could be helpful in their recovery and wellness after discharge in order to prevent future hospitalizations.   An emphasis was placed on using counselor, doctor, therapy groups, 12-step groups, and problem-specific support groups to expand supports.  They also worked as a group on developing a specific plan for several patients to deal with unhealthy supports through [place], psychoeducation with loved ones, and even termination of relationships.   Therapeutic Goals:   1)  discuss importance of adding supports to stay well once out of the hospital  2)  compare healthy versus unhealthy supports and identify some examples of each  3)  generate ideas and descriptions of healthy supports that can be added  4)  offer mutual support about how to address unhealthy supports  5)  encourage active participation in and adherence to discharge plan    Summary of Patient Progress:  Patient came to group late but stated that she has really good family support but she does not have any self-esteem.   Therapeutic Modalities:   Motivational Interviewing Brief Solution-Focused Therapy  Raina Mina, Latanya Presser 01/01/2021  3:28 PM

## 2021-01-02 NOTE — [facility] Group Notes (Signed)
LCSW Group Therapy Note     01/02/2021 2:19 PM     Type of Therapy and Topic:  Group Therapy:  Overcoming Obstacles     Participation Level:  Active     Description of Group:     In this group patients will be encouraged to explore what they see as obstacles to their own wellness and recovery. They will be guided to discuss their thoughts, feelings, and behaviors related to these obstacles. The group will process together ways to cope with barriers, with attention given to specific choices patients can make. Each patient will be challenged to identify changes they are motivated to make in order to overcome their obstacles. This group will be process-oriented, with patients participating in exploration of their own experiences as well as giving and receiving support and challenge from other group members.     Therapeutic Goals:  1.    Patient will identify personal and current obstacles as they relate to admission.  2.    Patient will identify barriers that currently interfere with their wellness or overcoming obstacles.  3.    Patient will identify feelings, thought process and behaviors related to these barriers.  4.    Patient will identify two changes they are willing to make to overcome these obstacles:        Summary of Patient Progress:   Patient was present for the entirety of the group session. Patient was an active listener and participated in the topic of discussion, provided helpful advice to others, and added nuance to topic of conversation. She discussed how her faith felt challenged lately and that was a significant obstacle. She stated that since her mother's death, her faith and her family's faith has not been the same. She noted concerns about restarting negative routines of being in bed all day and CSW affirmed her ideas around changing out of her pajamas first thing in the morning.    Therapeutic Modalities:    Cognitive Behavioral Therapy  Solution  Focused Therapy  Motivational Interviewing  Relapse Prevention Therapy     Aarica Wax [place], MSW, [place]  01/02/2021 2:19 PM

## 2021-01-02 NOTE — Plan of Care (Signed)
Patient endorsing anxiety with this writer  Problem: Self-Concept: Goal: Level of anxiety will decrease Outcome: Not Progressing
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
## 2021-01-02 NOTE — Plan of Care (Signed)
  Problem: Education: Goal: Ability to state activities that reduce stress will improve Outcome: Progressing   Problem: Coping: Goal: Ability to identify and develop effective coping behavior will improve Outcome: Progressing   Problem: Self-Concept: Goal: Ability to identify factors that promote anxiety will improve Outcome: Progressing Goal: Level of anxiety will decrease Outcome: Progressing Goal: Ability to modify response to factors that promote anxiety will improve Outcome: Progressing   Problem: Education: Goal: Knowledge of [place] [facility] Education information/materials will improve Outcome: Progressing Goal: Emotional status will improve Outcome: Progressing Goal: Mental status will improve Outcome: Progressing Goal: Verbalization of understanding the information provided will improve Outcome: Progressing   Problem: Activity: Goal: Interest or engagement in activities will improve Outcome: Progressing Goal: Sleeping patterns will improve Outcome: Progressing   Problem: Physical Regulation: Goal: Ability to maintain clinical measurements within normal limits will improve Outcome: Progressing   Problem: Safety: Goal: Periods of time without injury will increase Outcome: Progressing

## 2021-01-02 NOTE — Progress Notes (Signed)
Patient calm and compliant during assessment denying SI/HI/AVH. Patient endorses anxiety. Patient given support. Patient observed interacting appropriately with staff and peers on the unit. Pt compliant with medication administration per MD orders. Pt given education, support, and encouragement to be active in her treatment plan. Patient being monitored Q 15 minutes for safety per unit protocol. Pt remains safe on the unit

## 2021-01-02 NOTE — [facility] Group Notes (Signed)
[facility] Group Notes:  (Nursing/MHT/Case Management/Adjunct)  Date:  01/02/2021  Time:  11:00 PM  Type of Therapy:  Group Therapy  Participation Level:  Minimal  Participation Quality:  Appropriate  Affect:  Appropriate  Cognitive:  Alert  Insight:  Good  Engagement in Group:  Engaged and came to group late and her goal is to get medicine right.   Modes of Intervention:  Support  Summary of Progress/Problems:  Nehemiah Settle 01/02/2021, 11:00 PM

## 2021-01-02 NOTE — Plan of Care (Signed)
Patient states that she feels " so so " very anxious when she thinks about her husband. Patient states " he wants sex. I don't care. He don't love me the way I want. We need marriage counseling very badly.I have therapy all my life. He never had anything." Patient hopes increasing her medicines and marriage counseling will help her. Patient picking on her nose when she gets anxious. Vistaril PRN given. Denies SI,HI and AVH. Support and encouragement given.

## 2021-01-02 NOTE — [facility] Counselor (Signed)
CSW met with the patient and provided her a list of providers that accept her insurance and provide couples counseling at the patient's request.   Assunta Curtis, MSW, LCSW 01/02/2021 12:13 PM

## 2021-01-02 NOTE — Progress Notes (Signed)
Recreation Therapy Notes   Date: 01/02/2021  Time: 10:20 am   Location: Court yard  Behavioral response: N/A   Intervention Topic: Social Skills   Discussion/Intervention: Patient did not attend group.   Clinical Observations/Feedback:  Patient did not attend group.   Karely Hurtado LRT/CTRS        Tipton Ballow 01/02/2021 11:56 AM

## 2021-01-02 NOTE — Progress Notes (Signed)
Patient is pleasant and cooperative. She has been isolative to her room this evening. She reports feeling better and denies si/hi/avh. She continues to endorse depression and anxiety, rates them both at 3/10 at this encounter. She is med compliant and tolerated her meds without incident. She is safe on the unit with Q 15 minute safety checks.  Encouraged to come to nurses station with any concerns.   Cleo Butler-Nicholson, LPN

## 2021-01-03 LAB — LIPID PANEL
Cholesterol: 210 mg/dL — ABNORMAL HIGH (ref 0–200)
HDL: 45 mg/dL (ref >40)
LDL Cholesterol: 113 mg/dL — ABNORMAL HIGH (ref 0–99)
Total CHOL/HDL Ratio: 4.7 RATIO
Triglycerides: 258 mg/dL — ABNORMAL HIGH (ref <150)
VLDL: 52 mg/dL — ABNORMAL HIGH (ref 0–40)

## 2021-01-03 MED ORDER — BUSPIRONE HCL 5 MG PO TABS
5.0000 mg | ORAL_TABLET | Freq: Two times a day (BID) | ORAL | Status: DC
  Administered 2021-01-03 – 2021-01-04 (×2): 5 mg via ORAL
  Filled 2021-01-03 (×2): qty 1

## 2021-01-03 NOTE — Plan of Care (Signed)
Patient endorses anxiety   Problem: Education: Goal: Emotional status will improve Outcome: Not Progressing Goal: Mental status will improve Outcome: Not Progressing

## 2021-01-03 NOTE — Progress Notes (Signed)
Recreation Therapy Notes  Date: 01/03/2021  Time: 10:00 am   Location: Courtyard  Behavioral response: N/A   Intervention Topic: Wellness    Discussion/Intervention: Patient did not attend group.   Clinical Observations/Feedback:  Patient did not attend group.   Vinson Tietze LRT/CTRS        Patria Warzecha 01/03/2021 1:02 PM

## 2021-01-03 NOTE — Plan of Care (Signed)
Patient stated that she could not sleep well because of nightmares and her anxiety and depression is not getting much better.Patient state that her goal is to get the right medications. Denies SI,HI and AVH. Patient stays in bed most of the time. Appetite and energy level good. ADLs maintained. Support and encouragement given.
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
## 2021-01-03 NOTE — BHH Group Notes (Signed)
LCSW Group Therapy Note  01/03/2021 2:50 PM  Type of Therapy/Topic:  Group Therapy:  Feelings about Diagnosis  Participation Level:  Did Not Attend   Description of Group:   This group will allow patients to explore their thoughts and feelings about diagnoses they have received. Patients will be guided to explore their level of understanding and acceptance of these diagnoses. Facilitator will encourage patients to process their thoughts and feelings about the reactions of others to their diagnosis and will guide patients in identifying ways to discuss their diagnosis with significant others in their lives. This group will be process-oriented, with patients participating in exploration of their own experiences, giving and receiving support, and processing challenge from other group members.   Therapeutic Goals: Patient will demonstrate understanding of diagnosis as evidenced by identifying two or more symptoms of the disorder Patient will be able to express two feelings regarding the diagnosis Patient will demonstrate their ability to communicate their needs through discussion and/or role play  Summary of Patient Progress: X     Therapeutic Modalities:   Cognitive Behavioral Therapy Brief Therapy Feelings Identification   Assunta Curtis, MSW, LCSW 01/03/2021 2:50 PM

## 2021-01-03 NOTE — Progress Notes (Signed)
Patient calm and compliant during assessment denying SI/HI/AVH. Patient endorses anxiety. Patient given support. Patient observed interacting appropriately with staff and peers on the unit. Pt compliant with medication administration per MD orders. Pt given education, support, and encouragement to be active in her treatment plan. Patient being monitored Q 15 minutes for safety per unit protocol. Pt remains safe on the unit

## 2021-01-04 MED ORDER — BUSPIRONE HCL 5 MG PO TABS
10.0000 mg | ORAL_TABLET | Freq: Two times a day (BID) | ORAL | Status: DC
  Administered 2021-01-04 – 2021-01-06 (×4): 10 mg via ORAL
  Filled 2021-01-04 (×4): qty 2

## 2021-01-04 NOTE — Tx Team (Signed)
Interdisciplinary Treatment and Diagnostic Plan Update  01/04/2021 Time of Session: 8:30 AM Joanna Mendoza MRN: [id number]  Principal Diagnosis: Severe recurrent major depression without psychotic features [facility])  Secondary Diagnoses: Principal Problem:   Severe recurrent major depression without psychotic features ([place]) Active Problems:   Suicidal ideation   PTSD (post-traumatic stress disorder)   Cognitive impairment   Current Medications:  Current Facility-Administered Medications  Medication Dose Route Frequency Provider Last Rate Last Admin   acetaminophen (TYLENOL) tablet 650 mg  650 mg Oral Q6H PRN Clapacs, Madie Reno, MD       alum & mag hydroxide-simeth (MAALOX/MYLANTA) 200-200-20 MG/5ML suspension 30 mL  30 mL Oral Q4H PRN Clapacs, Madie Reno, MD       busPIRone (BUSPAR) tablet 5 mg  5 mg Oral BID Salley Scarlet, MD   5 mg at 01/04/21 0818   calcium carbonate (OS-CAL - dosed in mg of elemental calcium) tablet 500 mg of elemental calcium  1 tablet Oral Daily Clapacs, John T, MD   500 mg of elemental calcium at 01/04/21 0818   fluvoxaMINE (LUVOX) tablet 150 mg  150 mg Oral QHS Salley Scarlet, MD   150 mg at 01/03/21 2118   gabapentin (NEURONTIN) capsule 200 mg  200 mg Oral QHS Salley Scarlet, MD   200 mg at 01/03/21 2119   magnesium hydroxide (MILK OF MAGNESIA) suspension 30 mL  30 mL Oral Daily PRN Clapacs, John T, MD       magnesium oxide (MAG-OX) tablet 200 mg  200 mg Oral BH-q7a Clapacs, John T, MD   200 mg at 01/04/21 [id number]   melatonin tablet 10 mg  10 mg Oral QHS Clapacs, John T, MD   10 mg at 01/03/21 2117   metoprolol tartrate (LOPRESSOR) tablet 12.5 mg  12.5 mg Oral BID Salley Scarlet, MD   12.5 mg at 01/04/21 [id number]   pantoprazole (PROTONIX) EC tablet 40 mg  40 mg Oral Daily Clapacs, John T, MD   40 mg at 01/04/21 [id number]   polyvinyl alcohol (LIQUIFILM TEARS) 1.4 % ophthalmic solution 1 drop  1 drop Both Eyes Daily PRN Clapacs, John T, MD       pyridOXINE (VITAMIN B-6) tablet  100 mg  100 mg Oral Daily Clapacs, John T, MD   100 mg at 01/04/21 [id number]   traZODone (DESYREL) tablet 100 mg  100 mg Oral QHS PRN Clapacs, John T, MD   100 mg at 01/03/21 2118   PTA Medications: Medications Prior to Admission  Medication Sig Dispense Refill Last Dose   calcium carbonate (OS-CAL) 600 MG TABS tablet Take 600 mg by mouth daily.      carboxymethylcellulose (REFRESH PLUS) 0.5 % SOLN Apply to eye.      fluvoxaMINE (LUVOX) 100 MG tablet Take 100 mg by mouth at bedtime.      gabapentin (NEURONTIN) 100 MG capsule Take 100 mg by mouth 2 (two) times daily.  11    hydrOXYzine (VISTARIL) 50 MG capsule Take 50 mg by mouth 2 (two) times daily.  1    Magnesium Oxide -Mg Supplement 250 MG TABS Take 250 mg by mouth every morning.       Melatonin 5 MG CAPS Take 10 mg by mouth at bedtime.       omeprazole (PRILOSEC) 40 MG capsule TK 1 C PO QD      pyridOXINE (VITAMIN B-6) 100 MG tablet Take 100 mg by mouth daily.      traZODone (DESYREL)  100 MG tablet Take by mouth.       Patient Stressors: Marital or family conflict  Patient Strengths: Agricultural engineer for treatment/growth  Treatment Modalities: Medication Management, Group therapy, Case management,  1 to 1 session with clinician, Psychoeducation, Recreational therapy.   Physician Treatment Plan for Primary Diagnosis: Severe recurrent major depression without psychotic features ([place]) Long Term Goal(s): Improvement in symptoms so as ready for discharge   Short Term Goals: Ability to identify changes in lifestyle to reduce recurrence of condition will improve Ability to verbalize feelings will improve Ability to disclose and discuss suicidal ideas Ability to demonstrate self-control will improve Ability to identify and develop effective coping behaviors will improve Ability to maintain clinical measurements within normal limits will improve Compliance with prescribed medications will improve  Medication Management:  Evaluate patient's response, side effects, and tolerance of medication regimen.  Therapeutic Interventions: 1 to 1 sessions, Unit Group sessions and Medication administration.  Evaluation of Outcomes: Progressing  Physician Treatment Plan for Secondary Diagnosis: Principal Problem:   Severe recurrent major depression without psychotic features ([place]) Active Problems:   Suicidal ideation   PTSD (post-traumatic stress disorder)   Cognitive impairment  Long Term Goal(s): Improvement in symptoms so as ready for discharge   Short Term Goals: Ability to identify changes in lifestyle to reduce recurrence of condition will improve Ability to verbalize feelings will improve Ability to disclose and discuss suicidal ideas Ability to demonstrate self-control will improve Ability to identify and develop effective coping behaviors will improve Ability to maintain clinical measurements within normal limits will improve Compliance with prescribed medications will improve     Medication Management: Evaluate patient's response, side effects, and tolerance of medication regimen.  Therapeutic Interventions: 1 to 1 sessions, Unit Group sessions and Medication administration.  Evaluation of Outcomes: Progressing   RN Treatment Plan for Primary Diagnosis: Severe recurrent major depression without psychotic features ([place]) Long Term Goal(s): Knowledge of disease and therapeutic regimen to maintain health will improve  Short Term Goals: Ability to remain free from injury will improve, Ability to verbalize frustration and anger appropriately will improve, Ability to demonstrate self-control, Ability to participate in decision making will improve, Ability to verbalize feelings will improve, Ability to disclose and discuss suicidal ideas, Ability to identify and develop effective coping behaviors will improve, and Compliance with prescribed medications will improve  Medication Management: RN will administer  medications as ordered by provider, will assess and evaluate patient's response and provide education to patient for prescribed medication. RN will report any adverse and/or side effects to prescribing provider.  Therapeutic Interventions: 1 on 1 counseling sessions, Psychoeducation, Medication administration, Evaluate responses to treatment, Monitor vital signs and CBGs as ordered, Perform/monitor CIWA, COWS, AIMS and Fall Risk screenings as ordered, Perform wound care treatments as ordered.  Evaluation of Outcomes: Progressing   LCSW Treatment Plan for Primary Diagnosis: Severe recurrent major depression without psychotic features ([place]) Long Term Goal(s): Safe transition to appropriate next level of care at discharge, Engage patient in therapeutic group addressing interpersonal concerns.  Short Term Goals: Engage patient in aftercare planning with referrals and resources, Increase social support, Increase ability to appropriately verbalize feelings, Increase emotional regulation, Facilitate acceptance of mental health diagnosis and concerns, Facilitate patient progression through stages of change regarding substance use diagnoses and concerns, Identify triggers associated with mental health/substance abuse issues, and Increase skills for wellness and recovery  Therapeutic Interventions: Assess for all discharge needs, 1 to 1 time with Social worker, Explore available resources  and support systems, Assess for adequacy in community support network, Educate family and significant other(s) on suicide prevention, Complete Psychosocial Assessment, Interpersonal group therapy.  Evaluation of Outcomes: Progressing   Progress in Treatment: Attending groups: No. Participating in groups: No. Taking medication as prescribed: Yes. Toleration medication: Yes. Family/Significant other contact made: Yes, individual(s) contacted:  SPE completed with patient and collateral.  Patient understands diagnosis:  Yes. Discussing patient identified problems/goals with staff: Yes. Medical problems stabilized or resolved: Yes. Denies suicidal/homicidal ideation: Yes. Issues/concerns per patient self-inventory: Yes. Other: none   New problem(s) identified: No, Describe:  no additional problems identified at this time.   New Short Term/Long Term Goal(s): detox, elimination of symptoms of psychosis, medication management for mood stabilization; elimination of SI thoughts; development of comprehensive mental wellness plan. Update 01/04/2021:  No changes at this time.   Patient Goals:  "get the right kind of medicine" Update 01/04/2021:  No changes at this time.   Discharge Plan or Barriers: no barriers foreseen at this time. Update 01/04/2021:  Patient reports plans to return to her home.  She reports that she will continue treatment with her current therapist.  Current therapist appointment is 01/10/2021 at 29.  She has requested information on providers that offer couples counseling and CSW has provided her with a list.   Reason for Continuation of Hospitalization: Anxiety Depression  Estimated Length of Stay:  Attendees: Patient:  01/04/2021 8:55 AM  Physician: Selina Cooley, MD 01/04/2021 8:55 AM  Nursing:  01/04/2021 8:55 AM  RN Care Manager: 01/04/2021 8:55 AM  Social Worker: Assunta Curtis, MSW, LCSW  01/04/2021 8:55 AM  Recreational Therapist:  01/04/2021 8:55 AM  Other:  01/04/2021 8:55 AM  Other:  01/04/2021 8:55 AM  Other: 01/04/2021 8:55 AM    Scribe for Treatment Team: Rozann Lesches, LCSW 01/04/2021 8:55 AM

## 2021-01-04 NOTE — [facility] Group Notes (Signed)
LCSW Group Therapy Note  01/04/2021 3:19 PM  Type of Therapy/Topic:  Group Therapy:  Emotion Regulation  Participation Level:  Active   Description of Group:   The purpose of this group is to assist patients in learning to regulate negative emotions and experience positive emotions. Patients will be guided to discuss ways in which they have been vulnerable to their negative emotions. These vulnerabilities will be juxtaposed with experiences of positive emotions or situations, and patients will be challenged to use positive emotions to combat negative ones. Special emphasis will be placed on coping with negative emotions in conflict situations, and patients will process healthy conflict resolution skills.  Therapeutic Goals: Patient will identify two positive emotions or experiences to reflect on in order to balance out negative emotions Patient will label two or more emotions that they find the most difficult to experience Patient will demonstrate positive conflict resolution skills through discussion and/or role plays  Summary of Patient Progress Patient was present for the entirety of the group session. Patient was an active listener and participated in the topic of discussion, provided helpful advice to others, and added nuance to topic of conversation. Patient reported that her past tends to effect her emotions and the way she perceives life events.    Therapeutic Modalities:   Cognitive Behavioral Therapy Feelings Identification Dialectical Behavioral Therapy  Paulla Dolly, MSW, [place] 01/04/2021 3:19 PM

## 2021-01-04 NOTE — Progress Notes (Signed)
Patient is calm and cooperative with assessment. She denies suicidal ideations, homicidal ideations, and auditory and visual hallucinations. Patient rates depression and anxiety as a 6/10. She denies physical problems. Patient is observed Patient was educated on medications and was receptive to education. Patient is medication compliant. She is observed to be interacting appropriately with staff and other patients on the unit. Patient remains safe on the unit at this time.
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
## 2021-01-04 NOTE — Progress Notes (Signed)
Recreation Therapy Notes   Date: 01/04/2021  Time: 10:00 am   Location: Craft room   Behavioral response: Appropriate  Intervention Topic: Happiness   Discussion/Intervention:  Group content today was focused on Happiness. The group defined happiness and described where happiness comes from. Individuals identified what makes them happy and how they go about making others happy. Patients expressed things that stop them from being happy and ways they can improve their happiness. The group stated reasons why it is important to be happy. The group participated in the intervention "My Happiness", where they had a chance to identify and express things that make them happy. Clinical Observations/Feedback: Patient came to group late due to unknown reasons. Individual was social with peers and staff while participating in the intervention. Leiana Rund LRT/CTRS         Mayson Sterbenz 01/04/2021 12:39 PM

## 2021-01-05 MED ORDER — BACITRACIN-NEOMYCIN-POLYMYXIN 400-5-5000 EX OINT
TOPICAL_OINTMENT | Freq: Three times a day (TID) | CUTANEOUS | Status: DC
  Administered 2021-01-05: 1 via TOPICAL
  Filled 2021-01-05 (×5): qty 1

## 2021-01-05 NOTE — Plan of Care (Signed)
  Problem: Education: Goal: Ability to state activities that reduce stress will improve Outcome: Progressing   Problem: Coping: Goal: Ability to identify and develop effective coping behavior will improve Outcome: Progressing   Problem: Self-Concept: Goal: Ability to identify factors that promote anxiety will improve Outcome: Progressing Goal: Level of anxiety will decrease Outcome: Progressing Goal: Ability to modify response to factors that promote anxiety will improve Outcome: Progressing   Problem: Education: Goal: Emotional status will improve Outcome: Progressing Goal: Mental status will improve Outcome: Progressing

## 2021-01-05 NOTE — Plan of Care (Signed)
  Problem: Coping Skills Goal: STG - Patient will identify 3 positive coping skills strategies to use post d/c within 5 recreation therapy group sessions Description: STG - Patient will identify 3 positive coping skills strategies to use post d/c within 5 recreation therapy group sessions Outcome: Progressing   

## 2021-01-05 NOTE — Progress Notes (Signed)
Patient is cooperative with assessment. She denies suicidal ideations, homicidal ideations, and auditory and visual hallucinations. She states she is feeling more depressed than usual and rates it as a 10/10. She rates anxiety at a 7/10. Patient also says that yesterday, when she took a nap in the afternoon, she had bad nightmares again. She denies having nightmares before coming to the hospital and says that the previous night, she did not have any nightmares.   Patient is compliant with scheduled medications. Support and encouragement provided. Patient remains safe on the unit at this time.

## 2021-01-05 NOTE — [facility] Counselor (Signed)
CSW arranged phone meeting with patient and daughter to discuss discharge plan and [place]. Meeting scheduled for 01/06/21 @ 1100. Patient and treatment team informed of meeting.   Signed:  [name] Hurt, MSW, [place], LCASA 01/05/2021 1:54 PM

## 2021-01-05 NOTE — Progress Notes (Signed)
Recreation Therapy Notes   Date: 01/05/2021  Time: 10:00 am   Location: Courtyard    Behavioral response: N/A   Intervention Topic: Leisure   Discussion/Intervention: Patient did not attend group.   Clinical Observations/Feedback:  Patient did not attend group.   Amai Cappiello LRT/CTRS        Numan Zylstra 01/05/2021 11:24 AM
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
## 2021-01-05 NOTE — Progress Notes (Signed)
Patient visible on unit. Pt. calm and cooperative on approach. Pt. medication compliant, Pt. verbalizes understanding. No behavioral issues noted. Will continue to monitor pt. ATC for safety and continue plan of care.

## 2021-01-05 NOTE — [facility] Group Notes (Signed)
LCSW Group Therapy Note     01/05/2021 2:15 PM     Type of Therapy/Topic:  Group Therapy:  Balance in Life     Participation Level:  Active     Description of Group:    This group will address the concept of balance and how it feels and looks when one is unbalanced. Patients will be encouraged to process areas in their lives that are out of balance and identify reasons for remaining unbalanced. Facilitators will guide patients in utilizing problem-solving interventions to address and correct the stressor making their life unbalanced. Understanding and applying boundaries will be explored and addressed for obtaining and maintaining a balanced life. Patients will be encouraged to explore ways to assertively make their unbalanced needs known to significant others in their lives, using other group members and facilitator for support and feedback.     Therapeutic Goals:  1.      Patient will identify two or more emotions or situations they have that consume much of in their lives.  2.      Patient will identify signs/triggers that life has become out of balance:  3.      Patient will identify two ways to set boundaries in order to achieve balance in their lives:  4.      Patient will demonstrate ability to communicate their needs through discussion and/or role plays     Summary of Patient Progress: Patient was present for the entirety of the group session. Patient was an active listener and participated in the topic of discussion, provided helpful advice to others, and added nuance to topic of conversation. She stated that when she goes home she wants to find more balance and do more than just "watch tv and be in bed all day."  CSW and patient role played having a conversation with her husband about going to marriage counseling, improving communication, and doing more activities like hanging out with her children.       Therapeutic Modalities:   Cognitive Behavioral  Therapy  Solution-Focused Therapy  Assertiveness Training     Birgit Nowling [place] MSW, LCSW-A  01/05/2021 2:15 PM

## 2021-01-05 NOTE — Progress Notes (Signed)
Patient presents pleasant and cooperative. Denies SI, HI, AVH. Reports she believes she is doing much better. Spent more time in [place] socializing with peers. Out for snacks. Medication given and taken as prescribed. Prn given for sleep with partial relief. Pt did waked up around 1 am stating she could not sleep. Redirected to room.  Encouragement and support provided. Safety checks maintained. Medications given as prescribed. Pt receptive and remains safe on unit with q 15 min checks.

## 2021-01-06 MED ORDER — BUSPIRONE HCL 10 MG PO TABS: 10.0000 mg | ORAL_TABLET | Freq: Two times a day (BID) | ORAL | 1 refills | Status: DC

## 2021-01-06 MED ORDER — TRAZODONE HCL 100 MG PO TABS: 100.0000 mg | ORAL_TABLET | Freq: Every evening | ORAL | 1 refills | Status: DC | PRN

## 2021-01-06 MED ORDER — METOPROLOL TARTRATE 25 MG PO TABS: 12.5000 mg | ORAL_TABLET | Freq: Two times a day (BID) | ORAL | 1 refills | Status: DC

## 2021-01-06 MED ORDER — FLUVOXAMINE MALEATE 100 MG PO TABS: 150.0000 mg | ORAL_TABLET | Freq: Every day | ORAL | 1 refills | Status: DC

## 2021-01-06 MED ORDER — MELATONIN 10 MG PO TABS: 10.0000 mg | ORAL_TABLET | Freq: Every day | ORAL | 1 refills | Status: AC

## 2021-01-06 NOTE — Progress Notes (Signed)
Recreation Therapy Notes  INPATIENT RECREATION TR PLAN  Patient Details Name: Joanna Mendoza MRN: [id number] DOB: 12-Sep-1945 Today's Date: 01/06/2021  Rec Therapy Plan Is patient appropriate for Therapeutic Recreation?: Yes Treatment times per week: at least 3 Estimated Length of Stay: 5-7 days TR Treatment/Interventions: Group participation (Comment)  Discharge Criteria Pt will be discharged from therapy if:: Discharged Treatment plan/goals/alternatives discussed and agreed upon by:: Patient/family  Discharge Summary Short term goals set: Patient will identify 3 positive coping skills strategies to use post d/c within 5 recreation therapy group sessions Short term goals met: Adequate for discharge Progress toward goals comments: Groups attended Which groups?: Other (Comment) (Relaxation, Happiness, Creative Expressions) Reason goals not met: N/A Therapeutic equipment acquired: N/A Reason patient discharged from therapy: Discharge from hospital Pt/family agrees with progress & goals achieved: Yes Date patient discharged from therapy: 01/06/21   Lemuel Boodram 01/06/2021, 12:23 PM

## 2021-01-06 NOTE — Progress Notes (Signed)
   01/06/21 0955  Important Message  Medicare important message given? Yes    Important Message  Patient Details  Name: Joanna Mendoza MRN: [id number] Date of Birth: Sep 06, 1945   Medicare Important Message Given:  (P) Yes Patient informed of right to appeal discharge and provided handout with [facility] phone number. Patient expressed no interest in appealing discharge at this time. CSW will remain available to patient in case of appeal.      Larose Kells 01/06/2021, 9:55 AM

## 2021-01-06 NOTE — Plan of Care (Signed)
  Problem: Coping Skills Goal: STG - Patient will identify 3 positive coping skills strategies to use post d/c within 5 recreation therapy group sessions Description: STG - Patient will identify 3 positive coping skills strategies to use post d/c within 5 recreation therapy group sessions 01/06/2021 1222 by Ernest Haber, LRT Outcome: Adequate for Discharge 01/06/2021 1222 by Ernest Haber, LRT Outcome: Adequate for Discharge

## 2021-01-06 NOTE — Progress Notes (Signed)
Recreation Therapy Notes   Date: 01/06/2021  Time: 10:15 am   Location: Courtyard   Behavioral response: Appropriate  Intervention Topic: Relaxation    Discussion/Intervention:  Group content today was focused on relaxation. The group defined relaxation and identified healthy ways to relax. Individuals expressed how much time they spend relaxing. Patients expressed how much their life would be if they did not make time for themselves to relax. The group stated ways they could improve their relaxation techniques in the future.  Individuals participated in the intervention "Time to Relax" where they had a chance to experience different relaxation techniques.  Clinical Observations/Feedback: Patient came to group and was focused on the importance of relaxation. Individual was social with peers and staff while participating in the intervention.   Maleny Candy LRT/CTRS         Lochlin Eppinger 01/06/2021 12:21 PM
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
## 2021-01-06 NOTE — Progress Notes (Signed)
D: Pt alert and oriented. Pt denies experiencing any pain, SI/HI, or AVH at this time. Pt reports she will be able to keep herself safe when she returns home.   A: Pt received discharge and medication education/information. Pt belongings were returned and signed for at this time.   R: Pt verbalized understanding of discharge and medication education/information.  Pt escorted by staff to the medical mall front lobby where pt's daughter picked her up.

## 2021-01-06 NOTE — Progress Notes (Signed)
D: Pt alert and oriented. Pt rates depression 4/10, hopelessness 0/10, and anxiety 4/10. Pt goal: "Going home." Pt reports energy level as normal and concentration as being good. Pt reports sleep last night as being good. Pt did not receive medications for sleep. Pt denies experiencing any pain at this time. Pt denies experiencing any SI/HI, or AVH at this time.   A: Scheduled medications administered to pt, per MD orders. Support and encouragement provided. Frequent verbal contact made. Routine safety checks conducted q15 minutes.   R: No adverse drug reactions noted. Pt verbally contracts for safety at this time. Pt complaint with medications and treatment plan. Pt interacts well with others on the unit. Pt remains safe at this time. Will continue to monitor.

## 2021-01-06 NOTE — [facility] Suicide Risk Assessment (Signed)
[facility] Discharge Suicide Risk Assessment   Principal Problem: Severe recurrent major depression without psychotic features [facility]) Discharge Diagnoses: Principal Problem:   Severe recurrent major depression without psychotic features ([place]) Active Problems:   Suicidal ideation   PTSD (post-traumatic stress disorder)   Cognitive impairment   Total Time spent with patient: 35 minutes- 25 minutes face-to-face contact with patient, 10 minutes documentation, coordination of care, scripts   Musculoskeletal: Strength & Muscle Tone: within normal limits Gait & Station: normal Patient leans: N/A  Psychiatric Specialty Exam  Presentation  General Appearance: Appropriate for Environment  Eye Contact:Good  Speech:Clear and Coherent; Normal Rate  Speech Volume:Normal  Handedness:Right   Mood and Affect  Mood:Euthymic  Duration of Depression Symptoms: No data recorded Affect:Congruent   Thought Process  Thought Processes:Coherent; Goal Directed  Descriptions of Associations:Intact  Orientation:Full (Time, Place and Person)  Thought Content:Logical  History of Schizophrenia/Schizoaffective disorder:No data recorded Duration of Psychotic Symptoms:No data recorded Hallucinations:Hallucinations: None  Ideas of Reference:None  Suicidal Thoughts:Suicidal Thoughts: No  Homicidal Thoughts:Homicidal Thoughts: No   Sensorium  Memory:Immediate Fair; Recent Fair; Remote Fair  Judgment:Intact  Insight:Fair   Executive Functions  Concentration:Fair  Attention Span:Fair  [place]   Psychomotor Activity  Psychomotor Activity:Psychomotor Activity: Normal   Assets  Assets:Communication Skills; Desire for Improvement; Financial Resources/Insurance; Housing; Resilience; Social Support   Sleep  Sleep:Sleep: Good Number of Hours of Sleep: 8.25   Physical Exam: Physical Exam Vitals and nursing note reviewed.   Constitutional:      Appearance: Normal appearance.  HENT:     Head: Normocephalic and atraumatic.     Right Ear: External ear normal.     Left Ear: External ear normal.     Nose:     Comments: Excoriation from skin picking to bridge of nose    Mouth/Throat:     Mouth: Mucous membranes are moist.     Pharynx: Oropharynx is clear.  Eyes:     Extraocular Movements: Extraocular movements intact.     Conjunctiva/sclera: Conjunctivae normal.     Pupils: Pupils are equal, round, and reactive to light.  Cardiovascular:     Rate and Rhythm: Normal rate.     Pulses: Normal pulses.  Pulmonary:     Effort: Pulmonary effort is normal.     Breath sounds: Normal breath sounds.  Abdominal:     General: Abdomen is flat.     Palpations: Abdomen is soft.  Musculoskeletal:        General: No swelling. Normal range of motion.     Cervical back: Normal range of motion and neck supple.  Skin:    General: Skin is warm and dry.  Neurological:     General: No focal deficit present.     Mental Status: She is alert and oriented to person, place, and time.  Psychiatric:        Mood and Affect: Mood normal.        Behavior: Behavior normal.        Thought Content: Thought content normal.        Judgment: Judgment normal.   Review of Systems  Constitutional: Negative.   HENT: Negative.    Eyes: Negative.   Respiratory: Negative.    Cardiovascular: Negative.   Gastrointestinal: Negative.   Genitourinary: Negative.   Musculoskeletal: Negative.   Skin: Negative.   Neurological: Negative.   Endo/Heme/Allergies:  Positive for environmental allergies. Does not bruise/bleed easily.  Psychiatric/Behavioral:  Negative for hallucinations, substance  abuse and suicidal ideas. The patient does not have insomnia.   Blood pressure 132/70, pulse 80, temperature 98.5 F (36.9 C), temperature source Oral, resp. rate 18, height '4\' 11"'$  (1.499 m), weight 67.6 kg, SpO2 98 %. Body mass index is 30.09  kg/m.  Mental Status Per Nursing Assessment::   On Admission:  NA  Demographic Factors:  Age 38 or older and Caucasian  Loss Factors: NA  Historical Factors: Impulsivity  Risk Reduction Factors:   Sense of responsibility to family, Religious beliefs about death, Living with another person, especially a relative, Positive social support, Positive therapeutic relationship, and Positive coping skills or problem solving skills  Continued Clinical Symptoms:  Severe Anxiety and/or Agitation Depression:   Recent sense of peace/wellbeing Previous Psychiatric Diagnoses and Treatments  Cognitive Features That Contribute To Risk:  None    Suicide Risk:  Minimal: No identifiable suicidal ideation.  Patients presenting with no risk factors but with morbid ruminations; may be classified as minimal risk based on the severity of the depressive symptoms   Follow-up . Follow up.   Why: Appointment is scheduled for January 10, 2021 at 11:00AM.  Thanks! Contact information: [address] [place], [place] [address] Call [name] Phone: [phone] Fax: [phone]                Plan Of Care/Follow-up recommendations:  Activity:  as tolerated Diet:  low sodium heart healthy diet  Salley Scarlet, MD 01/06/2021, 9:18 AM

## 2021-01-06 NOTE — Progress Notes (Signed)
  [facility] Adult Case Management Discharge Plan :  Will you be returning to the same living situation after discharge:  Yes,  Patient to return to place of residence.  At discharge, do you have transportation home?: Yes,  Family to assist with transportation.  Do you have the ability to pay for your medications: Yes,  Tricare & Medicare.   Release of information consent forms completed and in the chart;  Patient's signature needed at discharge.  Patient to Follow up at:  Follow-up [place]. Follow up.   Why: Appointment is scheduled for January 10, 2021 at 11:00AM.  Thanks! Contact information: [address] [place], [place] [address] Call [name] Phone: [phone] Fax: [phone]        [facility]. Go on 01/06/2021.   Specialty: Behavioral Health Why: Please pressent for scheduled appointment (in-person) on 15 Aug @ 0800. Contact information: [address] [place] [place] [phone]                Next level of care provider has access to [place] and Suicide Prevention discussed: Yes,  SPE completed with patient and daughter.      Has patient been referred to the Quitline?: N/A patient is not a smoker  Patient has been referred for addiction treatment:  N/A   [name] Hurt, Latanya Presser 01/06/2021, 9:41 AM

## 2021-01-10 DIAGNOSIS — F332 Major depressive disorder, recurrent severe without psychotic features: Secondary | ICD-10-CM | POA: Diagnosis not present

## 2021-01-10 DIAGNOSIS — F4312 Post-traumatic stress disorder, chronic: Secondary | ICD-10-CM | POA: Diagnosis not present

## 2021-01-10 DIAGNOSIS — F39 Unspecified mood [affective] disorder: Secondary | ICD-10-CM | POA: Diagnosis not present

## 2021-01-11 DIAGNOSIS — K529 Noninfective gastroenteritis and colitis, unspecified: Secondary | ICD-10-CM | POA: Diagnosis not present

## 2021-01-11 DIAGNOSIS — Z09 Encounter for follow-up examination after completed treatment for conditions other than malignant neoplasm: Secondary | ICD-10-CM | POA: Diagnosis not present

## 2021-01-11 DIAGNOSIS — F3342 Major depressive disorder, recurrent, in full remission: Secondary | ICD-10-CM | POA: Diagnosis not present
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
## 2021-01-16 ENCOUNTER — Other Ambulatory Visit: Payer: Self-pay

## 2021-01-16 ENCOUNTER — Encounter: Payer: Self-pay | Admitting: Psychiatry

## 2021-01-16 ENCOUNTER — Ambulatory Visit (INDEPENDENT_AMBULATORY_CARE_PROVIDER_SITE_OTHER): Payer: PPO | Admitting: Psychiatry

## 2021-01-16 VITALS — BP 147/75 | HR 74 | Temp 98.1°F | Wt 160.8 lb

## 2021-01-16 DIAGNOSIS — F3341 Major depressive disorder, recurrent, in partial remission: Secondary | ICD-10-CM | POA: Diagnosis not present

## 2021-01-16 DIAGNOSIS — G4700 Insomnia, unspecified: Secondary | ICD-10-CM

## 2021-01-16 DIAGNOSIS — F4310 Post-traumatic stress disorder, unspecified: Secondary | ICD-10-CM | POA: Diagnosis not present

## 2021-01-16 NOTE — Patient Instructions (Signed)
Continue Fluvoxamine 150 mg a night Contnue buspar 10 mg twice a day Continue Trazodone 100 mg at night Next appointment: 9/14 at 8 AM

## 2021-01-17 ENCOUNTER — Telehealth (HOSPITAL_COMMUNITY): Payer: Self-pay | Admitting: Psychiatry

## 2021-01-17 NOTE — Telephone Encounter (Signed)
D:  Pt returned case manager's phone call.  A:  Oriented pt.  Pt is declining until [facility] meets in person after Labor Day.  Will call pt back once [facility] is back in person to get pt scheduled to start.  Inform Dr. Modesta Messing.  R:  Pt receptive.

## 2021-01-17 NOTE — Telephone Encounter (Signed)
D:  Dr. Modesta Messing referred pt to [facility].  A:  Placed call to orient pt and provide her with a start date, but there was no answer.  Left vm for pt to call the case manager back.  Inform Dr. Modesta Messing.

## 2021-02-01 DIAGNOSIS — H43813 Vitreous degeneration, bilateral: Secondary | ICD-10-CM | POA: Diagnosis not present
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
## 2021-02-15 ENCOUNTER — Encounter: Payer: Self-pay | Admitting: Psychiatry

## 2021-02-15 ENCOUNTER — Telehealth (HOSPITAL_COMMUNITY): Payer: Self-pay | Admitting: Psychiatry

## 2021-02-15 ENCOUNTER — Telehealth (INDEPENDENT_AMBULATORY_CARE_PROVIDER_SITE_OTHER): Payer: PPO | Admitting: Psychiatry

## 2021-02-15 ENCOUNTER — Other Ambulatory Visit: Payer: Self-pay

## 2021-02-15 DIAGNOSIS — G4700 Insomnia, unspecified: Secondary | ICD-10-CM

## 2021-02-15 DIAGNOSIS — F4310 Post-traumatic stress disorder, unspecified: Secondary | ICD-10-CM | POA: Diagnosis not present

## 2021-02-15 DIAGNOSIS — F3341 Major depressive disorder, recurrent, in partial remission: Secondary | ICD-10-CM | POA: Diagnosis not present

## 2021-02-15 MED ORDER — BUSPIRONE HCL 10 MG PO TABS: 10.0000 mg | ORAL_TABLET | Freq: Two times a day (BID) | ORAL | 1 refills | Status: DC

## 2021-02-15 MED ORDER — FLUVOXAMINE MALEATE 100 MG PO TABS: 150.0000 mg | ORAL_TABLET | Freq: Every day | ORAL | 3 refills | Status: DC

## 2021-02-15 MED ORDER — TRAZODONE HCL 100 MG PO TABS: 100.0000 mg | ORAL_TABLET | Freq: Every evening | ORAL | 3 refills | Status: DC | PRN

## 2021-02-15 NOTE — Telephone Encounter (Signed)
D:  Dr. Modesta Messing referred pt back to [facility] because last month the case manager had expressed to pt that the group would be in person after Labor Day.  Unfortunately, just recently it was determined d/t staffing issues that the group will continue to be virtually until possibly the end of the year.  A:  Placed call to patient and explained all this to her.  Inform Dr. Modesta Messing.  Informed Beather Arbour, RN.  R:  Pt receptive.

## 2021-02-15 NOTE — Patient Instructions (Signed)
Continue Fluvoxamine 150 mg a night Continue buspar 10 mg twice a day  Continue Trazodone 100 mg at night 4. Next appointment: 11/21 at 8:30

## 2021-02-24 DIAGNOSIS — F39 Unspecified mood [affective] disorder: Secondary | ICD-10-CM | POA: Diagnosis not present

## 2021-02-24 DIAGNOSIS — F4312 Post-traumatic stress disorder, chronic: Secondary | ICD-10-CM | POA: Diagnosis not present

## 2021-02-24 DIAGNOSIS — F332 Major depressive disorder, recurrent severe without psychotic features: Secondary | ICD-10-CM | POA: Diagnosis not present

## 2021-03-13 ENCOUNTER — Telehealth: Payer: Self-pay

## 2021-03-13 NOTE — Telephone Encounter (Signed)
pt wanted to speak with you about the buspar that the hospital gave her. she wants to know if it is something else she can be put on . she states that she not feeling good taking this medication and she wanted to discuss other medication. she has an appt but you dont have anything sooner.

## 2021-03-13 NOTE — Telephone Encounter (Signed)
Called the patient. She states that she has been having diarrhea for the past few weeks, and no improvement in her rash.  She believes this is coming from [place].  She agrees with the following -Decrease BuSpar 5 mg twice a day -Next appointment 11/1 at 11 AM, video

## 2021-03-15 DIAGNOSIS — R3129 Other microscopic hematuria: Secondary | ICD-10-CM | POA: Diagnosis not present

## 2021-03-15 DIAGNOSIS — E782 Mixed hyperlipidemia: Secondary | ICD-10-CM | POA: Diagnosis not present

## 2021-03-15 DIAGNOSIS — R739 Hyperglycemia, unspecified: Secondary | ICD-10-CM | POA: Diagnosis not present

## 2021-03-24 DIAGNOSIS — F4312 Post-traumatic stress disorder, chronic: Secondary | ICD-10-CM | POA: Diagnosis not present

## 2021-03-24 DIAGNOSIS — F39 Unspecified mood [affective] disorder: Secondary | ICD-10-CM | POA: Diagnosis not present

## 2021-03-24 DIAGNOSIS — F332 Major depressive disorder, recurrent severe without psychotic features: Secondary | ICD-10-CM | POA: Diagnosis not present

## 2021-03-28 DIAGNOSIS — M5136 Other intervertebral disc degeneration, lumbar region: Secondary | ICD-10-CM | POA: Diagnosis not present

## 2021-03-28 DIAGNOSIS — E782 Mixed hyperlipidemia: Secondary | ICD-10-CM | POA: Diagnosis not present

## 2021-03-28 DIAGNOSIS — R3129 Other microscopic hematuria: Secondary | ICD-10-CM | POA: Diagnosis not present

## 2021-03-28 DIAGNOSIS — I471 Supraventricular tachycardia: Secondary | ICD-10-CM | POA: Diagnosis not present

## 2021-03-28 DIAGNOSIS — F3342 Major depressive disorder, recurrent, in full remission: Secondary | ICD-10-CM | POA: Diagnosis not present

## 2021-03-28 DIAGNOSIS — R739 Hyperglycemia, unspecified: Secondary | ICD-10-CM | POA: Diagnosis not present

## 2021-03-29 ENCOUNTER — Other Ambulatory Visit: Payer: Self-pay

## 2021-03-29 ENCOUNTER — Telehealth: Payer: Self-pay

## 2021-03-29 ENCOUNTER — Emergency Department
Admission: EM | Admit: 2021-03-29 | Discharge: 2021-03-30 | Disposition: A | Discharge status: Home / Self Care | Payer: PPO | Attending: Emergency Medicine | Admitting: Emergency Medicine

## 2021-03-29 DIAGNOSIS — G8929 Other chronic pain: Secondary | ICD-10-CM | POA: Diagnosis present

## 2021-03-29 DIAGNOSIS — M5136 Other intervertebral disc degeneration, lumbar region: Secondary | ICD-10-CM | POA: Diagnosis not present

## 2021-03-29 DIAGNOSIS — F329 Major depressive disorder, single episode, unspecified: Secondary | ICD-10-CM

## 2021-03-29 DIAGNOSIS — F332 Major depressive disorder, recurrent severe without psychotic features: Secondary | ICD-10-CM | POA: Diagnosis present

## 2021-03-29 DIAGNOSIS — F4310 Post-traumatic stress disorder, unspecified: Secondary | ICD-10-CM | POA: Diagnosis not present

## 2021-03-29 DIAGNOSIS — F32A Depression, unspecified: Secondary | ICD-10-CM | POA: Diagnosis not present

## 2021-03-29 DIAGNOSIS — R1011 Right upper quadrant pain: Secondary | ICD-10-CM | POA: Diagnosis not present

## 2021-03-29 DIAGNOSIS — Z96611 Presence of right artificial shoulder joint: Secondary | ICD-10-CM | POA: Diagnosis not present

## 2021-03-29 DIAGNOSIS — F330 Major depressive disorder, recurrent, mild: Secondary | ICD-10-CM | POA: Diagnosis not present

## 2021-03-29 DIAGNOSIS — R45851 Suicidal ideations: Secondary | ICD-10-CM | POA: Diagnosis not present

## 2021-03-29 DIAGNOSIS — R4189 Other symptoms and signs involving cognitive functions and awareness: Secondary | ICD-10-CM | POA: Diagnosis present

## 2021-03-29 DIAGNOSIS — Z046 Encounter for general psychiatric examination, requested by authority: Secondary | ICD-10-CM | POA: Diagnosis present

## 2021-03-29 DIAGNOSIS — Z79899 Other long term (current) drug therapy: Secondary | ICD-10-CM | POA: Diagnosis not present

## 2021-03-29 DIAGNOSIS — N3941 Urge incontinence: Secondary | ICD-10-CM | POA: Diagnosis present

## 2021-03-29 DIAGNOSIS — F429 Obsessive-compulsive disorder, unspecified: Secondary | ICD-10-CM

## 2021-03-29 DIAGNOSIS — R350 Frequency of micturition: Secondary | ICD-10-CM | POA: Diagnosis present

## 2021-03-29 DIAGNOSIS — M51369 Other intervertebral disc degeneration, lumbar region without mention of lumbar back pain or lower extremity pain: Secondary | ICD-10-CM | POA: Diagnosis present

## 2021-03-29 DIAGNOSIS — G3184 Mild cognitive impairment, so stated: Secondary | ICD-10-CM | POA: Diagnosis not present

## 2021-03-29 LAB — COMPREHENSIVE METABOLIC PANEL
ALT: 29 U/L (ref 0–44)
AST: 31 U/L (ref 15–41)
Albumin: 4.1 g/dL (ref 3.5–5.0)
Alkaline Phosphatase: 54 U/L (ref 38–126)
Anion gap: 7 (ref 5–15)
BUN: 17 mg/dL (ref 8–23)
CO2: 27 mmol/L (ref 22–32)
Calcium: 9.9 mg/dL (ref 8.9–10.3)
Chloride: 103 mmol/L (ref 98–111)
Creatinine, Ser: 0.86 mg/dL (ref 0.44–1.00)
GFR, Estimated: >60 mL/min (ref >60)
Glucose, Bld: 115 mg/dL — ABNORMAL HIGH (ref 70–99)
Potassium: 5.3 mmol/L — ABNORMAL HIGH (ref 3.5–5.1)
Sodium: 137 mmol/L (ref 135–145)
Total Bilirubin: 0.7 mg/dL (ref 0.3–1.2)
Total Protein: 7.8 g/dL (ref 6.5–8.1)

## 2021-03-29 LAB — CBC
HCT: 41.5 % (ref 36.0–46.0)
Hemoglobin: 13.8 g/dL (ref 12.0–15.0)
MCH: 29.5 pg (ref 26.0–34.0)
MCHC: 33.3 g/dL (ref 30.0–36.0)
MCV: 88.7 fL (ref 80.0–100.0)
Platelets: 323 10*3/uL (ref 150–400)
RBC: 4.68 MIL/uL (ref 3.87–5.11)
RDW: 14.9 % (ref 11.5–15.5)
WBC: 11.2 10*3/uL — ABNORMAL HIGH (ref 4.0–10.5)
nRBC: 0 % (ref 0.0–0.2)

## 2021-03-29 LAB — SALICYLATE LEVEL: Salicylate Lvl: <7 mg/dL — ABNORMAL LOW (ref 7.0–30.0)

## 2021-03-29 LAB — URINE DRUG SCREEN, QUALITATIVE (ARMC ONLY)
Amphetamines, Ur Screen: NOT DETECTED
Barbiturates, Ur Screen: NOT DETECTED
Benzodiazepine, Ur Scrn: NOT DETECTED
Cannabinoid 50 Ng, Ur ~~LOC~~: NOT DETECTED
Cocaine Metabolite,Ur ~~LOC~~: NOT DETECTED
MDMA (Ecstasy)Ur Screen: NOT DETECTED
Methadone Scn, Ur: NOT DETECTED
Opiate, Ur Screen: NOT DETECTED
Phencyclidine (PCP) Ur S: NOT DETECTED
Tricyclic, Ur Screen: NOT DETECTED

## 2021-03-29 LAB — ACETAMINOPHEN LEVEL: Acetaminophen (Tylenol), Serum: <10 ug/mL — ABNORMAL LOW (ref 10–30)

## 2021-03-29 LAB — ETHANOL: Alcohol, Ethyl (B): <10 mg/dL (ref <10)

## 2021-03-29 NOTE — ED Notes (Signed)
Pt belongings include two socks, two black shoes, gray pants, blue striped shirt, one bra. Pt has one ring that HUSBAND is taking with. Has a wedding band set that pt is unable to take off. Pt has purse with wallet in it. Separate striped bag with extra changes of clothes. 2/2 bags.

## 2021-03-29 NOTE — ED Triage Notes (Signed)
Pt comes pov with depression. States she needs to see a psychiatrist. Needs some medications for her depression. States she is having some SI with a plan to take more medications than she is supposed to. Was hospitalized recently for same.

## 2021-03-29 NOTE — Telephone Encounter (Signed)
pt left a message that she talked with [name], LCSW and she advised her to talk to you about something for depression.  pt stated that she has an appt but that you couldn't see her before than.  she wanted to know if you could give her something.

## 2021-03-29 NOTE — ED Notes (Deleted)
PT  VOL °

## 2021-03-29 NOTE — Telephone Encounter (Signed)
Contacted the patient.  She states that she is not doing well. She has crying spells, and cannot concentrate on things. She states that she was discontinued gabapentin by her provider the other day. On further elaboration, she has SI of overdosing her medication. Although she initially states that she feels safe at home, she later states that she has constant thought of SI of overdosing medication, and it has been difficult to fight that thought. It has been very stressful for her. She is willing to come to [facility] ED for further evaluation. She agrees that this will be shared with her husband. She agrees to come to ED with him.   Talked with her husband on the phone.  The above conversation was shared with her husband. He agrees to bring her to [facility] ED.   Discussed the above case with ED provider.

## 2021-03-29 NOTE — ED Notes (Signed)
PT  VOL °
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
## 2021-03-29 NOTE — Consult Note (Signed)
[place] Psychiatry Consult   Reason for Consult:Psychiatric Evaluation Referring Physician: Dr. Archie Balboa Patient Identification: Joanna Mendoza MRN:  [id number] Principal Diagnosis: <principal problem not specified> Diagnosis:  Active Problems:   Urge incontinence   Urinary frequency   Severe recurrent major depression without psychotic features ([place])   Suicidal ideation   PTSD (post-traumatic stress disorder)   Chronic RUQ pain   Degenerative disc disease, lumbar   Cognitive impairment   Total Time spent with patient: 1 hour  Subjective: "I need help with my medications." Joanna Mendoza is a 75 y.o. female patient presented to [facility] ED via pov voluntarily due to increased depression and anxiety. The patient reports needing medication for her depression. The patient was admitted inpatient about three months ago, and she shared she was placed on Buspar, which worked "beautifully" until about one month ago. The patient discussed, " I began reacting to the medication."  "Look at my skin. I have these rashes all over me." The patient does have some red skin irritation, but it looks like an insect bit her. The patient is crying and shaking. She shared she has a psychiatrist appointment next week." The patient said, "I sometimes want to take all my medications, but I know I should not. It is the wrong thing to do."  The patient was seen face-to-face by this provider; the chart was reviewed and consulted with Dr. Archie Balboa on 03/29/2021 due to the patient's care. It was discussed with the EDP that the patient does meet the criteria to be admitted to the psychiatric inpatient unit.  On evaluation, the patient is alert and oriented x 4, anxious, emotional but cooperative, and mood-congruent with affect. The patient does not appear to be responding to internal or external stimuli. Neither is the patient presenting with any delusional thinking. The patient denies auditory or visual hallucinations.  The patient admits to suicidal ideation but denies homicidal ideations. The patient is not presenting with any psychotic behavior but with some paranoid behaviors about her Buspar, causing a reaction. During an encounter with the patient, she could answer questions appropriately.  HPI: Per Dr. Archie Balboa, Joanna Mendoza is a 75 y.o. female who presents to the emergency department today because of concerns for depression.  Patient patient states she has a long history of depression.  She says that she was hospitalized a few months ago for psychiatric illness.  She states she was put on BuSpar at that time.  She is concerned that she now she is having a reaction to the BuSpar.  The patient states that she wants to take more pills than she should because she continues to have depression.  Past Psychiatric History:  Anxiety       Depression   Risk to Self:   Risk to Others:   Prior Inpatient Therapy:   Prior Outpatient Therapy:    Past Medical History:  Past Medical History:  Diagnosis Date   Anxiety    Anxiety    Arthritis    Colon polyps    Depression    Epigastric pain    Fatty liver    Frequent PVCs    GERD (gastroesophageal reflux disease)    IBS (irritable bowel syndrome)    Morbid obesity (HCC)    Nausea    PSVT (paroxysmal supraventricular tachycardia) (HCC)    Restless legs    Status post rotator cuff repair     Past Surgical History:  Procedure Laterality Date   ABDOMINAL HYSTERECTOMY  benign Left    breast mass removed   BREAST EXCISIONAL BIOPSY Left 2002   neg   BREAST SURGERY Left 2002   Breast Biopsy   CHOLECYSTECTOMY  2011   COLONOSCOPY WITH ESOPHAGOGASTRODUODENOSCOPY (EGD)     COLONOSCOPY WITH PROPOFOL N/A 08/07/2017   Procedure: COLONOSCOPY WITH PROPOFOL;  Surgeon: Manya Silvas, MD;  Location: [facility] ENDOSCOPY;  Service: Endoscopy;  Laterality: N/A;   ddd     ESOPHAGOGASTRODUODENOSCOPY (EGD) WITH PROPOFOL N/A 08/07/2017   Procedure:  ESOPHAGOGASTRODUODENOSCOPY (EGD) WITH PROPOFOL;  Surgeon: Manya Silvas, MD;  Location: [facility] ENDOSCOPY;  Service: Endoscopy;  Laterality: N/A;   KNEE ARTHROSCOPY Left 2008   KNEE ARTHROSCOPY     LAPAROSCOPIC BILATERAL SALPINGO OOPHERECTOMY     OOPHORECTOMY     SHOULDER ACROMIOPLASTY Right 04/06/2015   Procedure: SUBACROMIAL DECOMPRESSION, ROTATOR CUFF REPAIR;  Surgeon: Dereck Leep, MD;  Location: [facility] ORS;  Service: Orthopedics;  Laterality: Right;   Family History:  Family History  Problem Relation Age of Onset   Depression Mother    Depression Sister    Kidney disease Sister    Kidney failure Sister    Depression Brother    Breast cancer Neg Hx    Kidney cancer Neg Hx    Prostate cancer Neg Hx    Family Psychiatric  History:  Social History:  Social History   Substance and Sexual Activity  Alcohol Use Yes   Comment: occ.     Social History   Substance and Sexual Activity  Drug Use No    Social History   Socioeconomic History   Marital status: Married    Spouse name: Not on file   Number of children: Not on file   Years of education: Not on file   Highest education level: 10th grade  Occupational History   Not on file  Tobacco Use   Smoking status: Never   Smokeless tobacco: Never  Vaping Use   Vaping Use: Never used  Substance and Sexual Activity   Alcohol use: Yes    Comment: occ.   Drug use: No   Sexual activity: Not Currently  Other Topics Concern   Not on file  Social History Narrative   Not on file   Social Determinants of Health   Financial Resource Strain: Not on file  Food Insecurity: Not on file  Transportation Needs: Not on file  Physical Activity: Not on file  Stress: Not on file  Social Connections: Not on file   Additional Social History:    Allergies:   Allergies  Allergen Reactions   Celexa [Citalopram] Other (See Comments)    "unknown"   Cogentin [Benztropine] Other (See Comments)    "unknown"   Flagyl  [Metronidazole] Other (See Comments)    "unknown"   Hydrocodone    Adhesive [Tape] Rash    Labs:  Results for orders placed or performed during the hospital encounter of 03/29/21 (from the past 48 hour(s))  Comprehensive metabolic panel     Status: Abnormal   Collection Time: 03/29/21  6:01 PM  Result Value Ref Range   Sodium 137 135 - 145 mmol/L   Potassium 5.3 (H) 3.5 - 5.1 mmol/L   Chloride 103 98 - 111 mmol/L   CO2 27 22 - 32 mmol/L   Glucose, Bld 115 (H) 70 - 99 mg/dL    Comment: Glucose reference range applies only to samples taken after fasting for at least 8 hours.   BUN 17 8 - 23 mg/dL  Creatinine, Ser 0.86 0.44 - 1.00 mg/dL   Calcium 9.9 8.9 - 10.3 mg/dL   Total Protein 7.8 6.5 - 8.1 g/dL   Albumin 4.1 3.5 - 5.0 g/dL   AST 31 15 - 41 U/L   ALT 29 0 - 44 U/L   Alkaline Phosphatase 54 38 - 126 U/L   Total Bilirubin 0.7 0.3 - 1.2 mg/dL   GFR, Estimated >60 >60 mL/min    Comment: (NOTE) Calculated using the CKD-EPI Creatinine Equation (2021)    Anion gap 7 5 - 15    Comment: Performed at [facility], [address] [place], [place] [address]  Ethanol     Status: None   Collection Time: 03/29/21  6:01 PM  Result Value Ref Range   Alcohol, Ethyl (B) <10 <10 mg/dL    Comment: (NOTE) Lowest detectable limit for serum alcohol is 10 mg/dL.  For medical purposes only. Performed at [facility], [address] [place], [place] [address]   Salicylate level     Status: Abnormal   Collection Time: 03/29/21  6:01 PM  Result Value Ref Range   Salicylate Lvl <0.2 (L) 7.0 - 30.0 mg/dL    Comment: Performed at [facility], [address] [place], [place] [address]  Acetaminophen level     Status: Abnormal   Collection Time: 03/29/21  6:01 PM  Result Value Ref Range   Acetaminophen (Tylenol), Serum <10 (L) 10 - 30 ug/mL    Comment: (NOTE) Therapeutic concentrations vary significantly. A range of 10-30 ug/mL  may be an effective  concentration for many patients. However, some  are best treated at concentrations outside of this range. Acetaminophen concentrations >150 ug/mL at 4 hours after ingestion  and >50 ug/mL at 12 hours after ingestion are often associated with  toxic reactions.  Performed at [facility], [address] [place], [place] [address]   cbc     Status: Abnormal   Collection Time: 03/29/21  6:01 PM  Result Value Ref Range   WBC 11.2 (H) 4.0 - 10.5 K/uL   RBC 4.68 3.87 - 5.11 MIL/uL   Hemoglobin 13.8 12.0 - 15.0 g/dL   HCT 41.5 36.0 - 46.0 %   MCV 88.7 80.0 - 100.0 fL   MCH 29.5 26.0 - 34.0 pg   MCHC 33.3 30.0 - 36.0 g/dL   RDW 14.9 11.5 - 15.5 %   Platelets 323 150 - 400 K/uL   nRBC 0.0 0.0 - 0.2 %    Comment: Performed at [facility], [address] [place], [place] [address]  Urine Drug Screen, Qualitative     Status: None   Collection Time: 03/29/21  6:01 PM  Result Value Ref Range   Tricyclic, Ur Screen NONE DETECTED NONE DETECTED   Amphetamines, Ur Screen NONE DETECTED NONE DETECTED   MDMA (Ecstasy)Ur Screen NONE DETECTED NONE DETECTED   Cocaine Metabolite,Ur Lowgap NONE DETECTED NONE DETECTED   Opiate, Ur Screen NONE DETECTED NONE DETECTED   Phencyclidine (PCP) Ur S NONE DETECTED NONE DETECTED   Cannabinoid 50 Ng, Ur Kite NONE DETECTED NONE DETECTED   Barbiturates, Ur Screen NONE DETECTED NONE DETECTED   Benzodiazepine, Ur Scrn NONE DETECTED NONE DETECTED   Methadone Scn, Ur NONE DETECTED NONE DETECTED    Comment: (NOTE) Tricyclics + metabolites, urine    Cutoff 1000 ng/mL Amphetamines + metabolites, urine  Cutoff 1000 ng/mL MDMA (Ecstasy), urine  Cutoff 500 ng/mL Cocaine Metabolite, urine          Cutoff 300 ng/mL Opiate + metabolites, urine        Cutoff 300 ng/mL Phencyclidine (PCP), urine         Cutoff 25 ng/mL Cannabinoid, urine                 Cutoff 50 ng/mL Barbiturates + metabolites, urine  Cutoff 200 ng/mL Benzodiazepine, urine               Cutoff 200 ng/mL Methadone, urine                   Cutoff 300 ng/mL  The urine drug screen provides only a preliminary, unconfirmed analytical test result and should not be used for non-medical purposes. Clinical consideration and professional judgment should be applied to any positive drug screen result due to possible interfering substances. A more specific alternate chemical method must be used in order to obtain a confirmed analytical result. Gas chromatography / mass spectrometry (GC/MS) is the preferred confirm atory method. Performed at [facility], [address] [place], [place] [address]     No current facility-administered medications for this encounter.   Current Outpatient Medications  Medication Sig Dispense Refill   busPIRone (BUSPAR) 10 MG tablet Take 1 tablet (10 mg total) by mouth 2 (two) times daily. 60 tablet 1   calcium carbonate (OS-CAL) 600 MG TABS tablet Take 600 mg by mouth daily.     carboxymethylcellulose (REFRESH PLUS) 0.5 % SOLN Apply to eye.     fluvoxaMINE (LUVOX) 100 MG tablet Take 1.5 tablets (150 mg total) by mouth at bedtime. 45 tablet 3   gabapentin (NEURONTIN) 100 MG capsule Take 100 mg by mouth 2 (two) times daily.  11   hydrOXYzine (ATARAX/VISTARIL) 50 MG tablet TAKE 1 TABLET BY MOUTH 2 TO 3 TIMES DAILY AS DIRECTED     Magnesium Oxide -Mg Supplement 250 MG TABS Take 250 mg by mouth every morning.      melatonin 10 MG TABS Take 10 mg by mouth at bedtime. 30 tablet 1   metoprolol tartrate (LOPRESSOR) 25 MG tablet Take 0.5 tablets (12.5 mg total) by mouth 2 (two) times daily. 60 tablet 1   omeprazole (PRILOSEC) 40 MG capsule TK 1 C PO QD     pyridOXINE (VITAMIN B-6) 100 MG tablet Take 100 mg by mouth daily.     traZODone (DESYREL) 100 MG tablet Take 1 tablet (100 mg total) by mouth at bedtime as needed for sleep. 30 tablet 3    Musculoskeletal: Strength & Muscle Tone: within normal limits Gait & Station: normal Patient leans:  N/A  Psychiatric Specialty Exam:  Presentation  General Appearance: Appropriate for Environment  Eye Contact:Good  Speech:Clear and Coherent; Normal Rate  Speech Volume:Normal  Handedness:Right   Mood and Affect  Mood:Anxious; Depressed  Affect:Depressed; Full Range   Thought Process  Thought Processes:Coherent; Goal Directed  Descriptions of Associations:Intact  Orientation:Full (Time, Place and Person)  Thought Content:Logical  History of Schizophrenia/Schizoaffective disorder:No  Duration of Psychotic Symptoms:No data recorded Hallucinations:Hallucinations: None  Ideas of Reference:None  Suicidal Thoughts:Suicidal Thoughts: Yes, Passive SI Passive Intent and/or Plan: With Intent; With Plan; With Means to Carry Out  Homicidal Thoughts:Homicidal Thoughts: No   Sensorium  Memory:Immediate Good; Recent Good; Remote Good  Judgment:Intact  Insight:Poor   Executive Functions  Concentration:Fair  Attention Span:Fair  [place]   Psychomotor Activity  Psychomotor Activity:Psychomotor Activity: Normal   Assets  Assets:Communication Skills; Desire for Improvement; Financial Resources/Insurance; Intimacy; Physical Health; Resilience; Social Support   Sleep  Sleep:Sleep: Good Number of Hours of Sleep: 8   Physical Exam: Physical Exam Vitals and nursing note reviewed.  Constitutional:      Appearance: Normal appearance.  HENT:     Head: Normocephalic and atraumatic.     Right Ear: External ear normal.     Left Ear: External ear normal.     Nose: Nose normal.  Cardiovascular:     Rate and Rhythm: Tachycardia present.  Pulmonary:     Effort: Pulmonary effort is normal.  Musculoskeletal:        General: Normal range of motion.     Cervical back: Normal range of motion and neck supple.  Skin:    Findings: Rash present.  Neurological:     General: No focal deficit present.     Mental Status: She  is alert and oriented to person, place, and time.  Psychiatric:        Attention and Perception: Attention and perception normal.        Mood and Affect: Mood is anxious and depressed. Affect is inappropriate.        Speech: Speech is rapid and pressured.        Behavior: Behavior is cooperative.        Thought Content: Thought content is paranoid. Thought content includes suicidal ideation. Thought content includes suicidal plan.        Cognition and Memory: Cognition and memory normal.        Judgment: Judgment is impulsive and inappropriate.   Review of Systems  Psychiatric/Behavioral:  Positive for depression and suicidal ideas. The patient is nervous/anxious.   All other systems reviewed and are negative. Blood pressure (!) 162/100, pulse (!) 103, temperature 98.1 F (36.7 C), temperature source Oral, resp. rate 18, height 4\' 11"  (1.499 m), weight 68 kg, SpO2 97 %. Body mass index is 30.3 kg/m.  Treatment Plan Summary: Medication management and Plan Patient does meet criteria for psychiatric inpatient admission  Disposition: Recommend psychiatric Inpatient admission when medically cleared. Supportive therapy provided about ongoing stressors.  Caroline Sauger, NP 03/29/2021 10:39 PM

## 2021-03-29 NOTE — ED Provider Notes (Signed)
[facility] Emergency Department Provider Note   ____________________________________________   I have reviewed the triage vital signs and the nursing notes.   HISTORY  Chief Complaint Psychiatric Evaluation   History limited by: Not Limited   HPI Joanna Mendoza is a 75 y.o. female who presents to the emergency department today because of concerns for depression.  Patient patient states she has a long history of depression.  She says that she was hospitalized a few months ago for psychiatric illness.  She states she was put on BuSpar at that time.  She is concerned that she now she is having a reaction to the BuSpar.  The patient states that she wants to take more pills than she should because she continues to have depression.  Records reviewed. Per medical record review patient has a history of depression.   Past Medical History:  Diagnosis Date   Anxiety    Anxiety    Arthritis    Colon polyps    Depression    Epigastric pain    Fatty liver    Frequent PVCs    GERD (gastroesophageal reflux disease)    IBS (irritable bowel syndrome)    Morbid obesity (HCC)    Nausea    PSVT (paroxysmal supraventricular tachycardia) (HCC)    Restless legs    Status post rotator cuff repair     Patient Active Problem List   Diagnosis Date Noted   Degenerative disc disease, lumbar 12/30/2020   Osteopenia 12/30/2020   Cognitive impairment 12/30/2020   Severe recurrent major depression without psychotic features ([place]) 12/28/2020   Suicidal ideation 12/28/2020   PTSD (post-traumatic stress disorder) 12/28/2020   Primary osteoarthritis of left knee 02/09/2019   Renal cyst 07/30/2017   Urge incontinence 07/30/2017   Urinary urgency 07/30/2017   Urinary frequency 07/30/2017   Microhematuria 07/30/2017   Hx of adenomatous colonic polyps 07/08/2017   Other insomnia 11/22/2016   Frequent PVCs 08/22/2016   PSVT (paroxysmal supraventricular tachycardia) ([place])  06/27/2016   Chronic RUQ pain 04/01/2014   Mixed hyperlipidemia 12/11/2013    Past Surgical History:  Procedure Laterality Date   ABDOMINAL HYSTERECTOMY     benign Left    breast mass removed   BREAST EXCISIONAL BIOPSY Left 2002   neg   BREAST SURGERY Left 2002   Breast Biopsy   CHOLECYSTECTOMY  2011   COLONOSCOPY WITH ESOPHAGOGASTRODUODENOSCOPY (EGD)     COLONOSCOPY WITH PROPOFOL N/A 08/07/2017   Procedure: COLONOSCOPY WITH PROPOFOL;  Surgeon: Manya Silvas, MD;  Location: [facility] ENDOSCOPY;  Service: Endoscopy;  Laterality: N/A;   ddd     ESOPHAGOGASTRODUODENOSCOPY (EGD) WITH PROPOFOL N/A 08/07/2017   Procedure: ESOPHAGOGASTRODUODENOSCOPY (EGD) WITH PROPOFOL;  Surgeon: Manya Silvas, MD;  Location: [facility] ENDOSCOPY;  Service: Endoscopy;  Laterality: N/A;   KNEE ARTHROSCOPY Left 2008   KNEE ARTHROSCOPY     LAPAROSCOPIC BILATERAL SALPINGO OOPHERECTOMY     OOPHORECTOMY     SHOULDER ACROMIOPLASTY Right 04/06/2015   Procedure: SUBACROMIAL DECOMPRESSION, ROTATOR CUFF REPAIR;  Surgeon: Dereck Leep, MD;  Location: [facility] ORS;  Service: Orthopedics;  Laterality: Right;    Prior to Admission medications   Medication Sig Start Date End Date Taking? Authorizing Provider  busPIRone (BUSPAR) 10 MG tablet Take 1 tablet (10 mg total) by mouth 2 (two) times daily. 03/08/21 05/07/21  Norman [place], MD  calcium carbonate (OS-CAL) 600 MG TABS tablet Take 600 mg by mouth daily.    [provider]  carboxymethylcellulose (REFRESH  PLUS) 0.5 % SOLN Apply to eye.    [provider]  fluvoxaMINE (LUVOX) 100 MG tablet Take 1.5 tablets (150 mg total) by mouth at bedtime. 03/08/21 07/06/21  Norman [place], MD  gabapentin (NEURONTIN) 100 MG capsule Take 100 mg by mouth 2 (two) times daily. 07/04/17   [provider]  hydrOXYzine (ATARAX/VISTARIL) 50 MG tablet TAKE 1 TABLET BY MOUTH 2 TO 3 TIMES DAILY AS DIRECTED 12/22/20   [provider]  Magnesium Oxide -Mg Supplement 250 MG  TABS Take 250 mg by mouth every morning.     [provider]  melatonin 10 MG TABS Take 10 mg by mouth at bedtime. 01/06/21   Salley Scarlet, MD  metoprolol tartrate (LOPRESSOR) 25 MG tablet Take 0.5 tablets (12.5 mg total) by mouth 2 (two) times daily. 01/06/21   Salley Scarlet, MD  omeprazole (PRILOSEC) 40 MG capsule TK 1 C PO QD 05/17/18   [provider]  pyridOXINE (VITAMIN B-6) 100 MG tablet Take 100 mg by mouth daily.    [provider]  traZODone (DESYREL) 100 MG tablet Take 1 tablet (100 mg total) by mouth at bedtime as needed for sleep. 03/08/21 07/06/21  Norman Clay, MD    Allergies Celexa [citalopram], Cogentin [benztropine], Flagyl [metronidazole], Hydrocodone, and Adhesive [tape]  Family History  Problem Relation Age of Onset   Depression Mother    Depression Sister    Kidney disease Sister    Kidney failure Sister    Depression Brother    Breast cancer Neg Hx    Kidney cancer Neg Hx    Prostate cancer Neg Hx     Social History Social History   Tobacco Use   Smoking status: Never   Smokeless tobacco: Never  Vaping Use   Vaping Use: Never used  Substance Use Topics   Alcohol use: Yes    Comment: occ.   Drug use: No    Review of Systems Constitutional: No fever/chills Eyes: No visual changes. ENT: No sore throat. Cardiovascular: Denies chest pain. Respiratory: Denies shortness of breath. Gastrointestinal: No abdominal pain.  No nausea, no vomiting.  No diarrhea.   Genitourinary: Negative for dysuria. Musculoskeletal: Negative for back pain. Skin: Positive for rash. Neurological: Negative for headaches, focal weakness or numbness.  ____________________________________________   PHYSICAL EXAM:  VITAL SIGNS: ED Triage Vitals  Enc Vitals Group     BP 03/29/21 1757 (!) 162/100     Pulse Rate 03/29/21 1757 (!) 103     Resp 03/29/21 1757 18     Temp 03/29/21 1757 98.1 F (36.7 C)     Temp Source 03/29/21 1757 Oral     SpO2  03/29/21 1757 97 %     Weight 03/29/21 1758 150 lb (68 kg)     Height 03/29/21 1758 4\' 11"  (1.499 m)     Head Circumference --      Peak Flow --      Pain Score 03/29/21 1758 0   Constitutional: Alert and oriented.  Eyes: Conjunctivae are normal.  ENT      Head: Normocephalic and atraumatic.      Nose: No congestion/rhinnorhea.      Mouth/Throat: Mucous membranes are moist.      Neck: No stridor. Hematological/Lymphatic/Immunilogical: No cervical lymphadenopathy. Cardiovascular: Normal rate, regular rhythm.  No murmurs, rubs, or gallops.  Respiratory: Normal respiratory effort without tachypnea nor retractions. Breath sounds are clear and equal bilaterally. No wheezes/rales/rhonchi. Gastrointestinal: Soft and non tender. No rebound. No  guarding.  Genitourinary: Deferred Musculoskeletal: Normal range of motion in all extremities. No lower extremity edema. Neurologic:  Normal speech and language. No gross focal neurologic deficits are appreciated.  Skin:  Skin is warm, dry and intact. No rash noted. Psychiatric: Mood and affect are normal. Speech and behavior are normal. Patient exhibits appropriate insight and judgment.  ____________________________________________    LABS (pertinent positives/negatives)  Acetaminophen, ethanol, salicylate below threshold CBC wbc 11.2, hgb 13.8, plt 323 CMP wnl except k 5.3, glu 115  ____________________________________________   EKG  None  ____________________________________________    RADIOLOGY  None  ____________________________________________   PROCEDURES  Procedures  ____________________________________________   INITIAL IMPRESSION / ASSESSMENT AND PLAN / ED COURSE  Pertinent labs & imaging results that were available during my care of the patient were reviewed by me and considered in my medical decision making (see chart for details).   Patient presents to the emergency department today because of concerns for  depression.  Patient was evaluated by psychiatric team.  They will try to find inpatient bed for patient.  The patient has been placed in psychiatric observation due to the need to provide a safe environment for the patient while obtaining psychiatric consultation and evaluation, as well as ongoing medical and medication management to treat the patient's condition.  The patient has not been placed under full IVC at this time.   ____________________________________________   FINAL CLINICAL IMPRESSION(S) / ED DIAGNOSES  Final diagnoses:  Depression, unspecified depression type     Note: This dictation was prepared with Dragon dictation. Any transcriptional errors that result from this process are unintentional     Nance Pear, MD 03/29/21 2151

## 2021-03-30 DIAGNOSIS — F429 Obsessive-compulsive disorder, unspecified: Secondary | ICD-10-CM

## 2021-03-30 DIAGNOSIS — F332 Major depressive disorder, recurrent severe without psychotic features: Secondary | ICD-10-CM | POA: Diagnosis not present

## 2021-03-30 MED ORDER — HYDROXYZINE HCL 25 MG PO TABS
50.0000 mg | ORAL_TABLET | Freq: Four times a day (QID) | ORAL | Status: DC | PRN
  Administered 2021-03-30: 50 mg via ORAL
  Filled 2021-03-30 (×2): qty 2

## 2021-03-30 MED ORDER — FLUVOXAMINE MALEATE 100 MG PO TABS: 200.0000 mg | ORAL_TABLET | Freq: Every day | ORAL | 1 refills | Status: DC

## 2021-03-30 NOTE — ED Notes (Signed)
Pt to be given belongings by NT. Pt's significant other in lobby to pick her up. Pt offered wheelchair but declined stating she would prefer to walk out.

## 2021-03-30 NOTE — BH Assessment (Signed)
Referral information for Geropsych faxed to:  [facility] [phone]- [phone]),   [facility] [phone]),  [facility] [phone] -or- [phone]   [facility] [phone] or [phone]),   [facility] [phone]

## 2021-03-30 NOTE — ED Provider Notes (Addendum)
Dr. Weber Cooks has seen the patient this morning and advised that patient may be discharged to home with diagnosis of major depressive disorder.  He is advised patient of medication changes and sent prescription for her.  She can follow-up with her outpatient psychiatrist Dr. Modesta Messing  Patient seen, recommendation for discharge to home from psychiatry.  Previously medically cleared for psychiatric evaluation   Delman Kitten, MD 03/30/21 1043   Patient alert oriented very pleasant.  She reports comfortable with plan to follow-up with her outpatient psychiatrist.  Her husband will be coming to pick her up.  Return precautions and treatment recommendations and follow-up discussed with the patient who is agreeable with the plan.     Delman Kitten, MD 03/30/21 [phone]

## 2021-03-30 NOTE — ED Notes (Signed)
Pt informed that she was going to be transferred to [facility]- pt stated that if she could not be admitted at this facility then she wanted to go home- Kerry Dory with TTS, Dr Weber Cooks, and Dr Jacqualine Code aware

## 2021-03-30 NOTE — BH Assessment (Addendum)
Patient has been accepted to [facility]  Patient assigned to [place].  Accepting physician is Dr. Reece Levy.  Call report to [phone].  Representative was [employer].   ER Staff is aware of it:  Vaughan Basta, ER Secretary  Dr. Beather Arbour, ER MD  Deneise Lever Patient's Nurse     Patient can be transported anytime after 9 AM.

## 2021-03-30 NOTE — ED Notes (Signed)
SAFE  TRANSPORT  CALLED  FOR  TRANSPORT TO  OLD  [facility]

## 2021-03-30 NOTE — Consult Note (Signed)
Joanna Mendoza Consult   Reason for Consult: Consult to follow-up with an assessed 75 year old woman with history of depression and anxiety Referring Physician: Quale Patient Identification: Joanna Mendoza MRN:  [id number] Principal Diagnosis: Severe recurrent major depression without psychotic features ([place]) Diagnosis:  Principal Problem:   Severe recurrent major depression without psychotic features ([place]) Active Problems:   Urge incontinence   Urinary frequency   Suicidal ideation   PTSD (post-traumatic stress disorder)   Chronic RUQ pain   Degenerative disc disease, lumbar   Cognitive impairment   OCD (obsessive compulsive disorder)   Total Time spent with patient: 1 hour  Subjective:   Joanna Mendoza is a 75 y.o. female patient admitted with "I have been feeling worse".  HPI: Patient seen chart reviewed.  Previous notes evaluated.  75 year old woman with a history of recurrent depression and anxiety disorders came to the emergency room reporting that her depression and anxiety had gotten worse and also complaining of what she perceived to be side effects of her medicine.  Patient had been determined as being appropriate for inpatient treatment and a bed was found at old [facility].  The patient however is declining transfer to [facility].  On reinterview the patient described herself as having been depressed and very anxious for the last couple weeks.  Initially she felt that the low dose of BuSpar she was taking was helpful but now finds that she is more sad and more nervous and run down.  Has had some suicidal thoughts but says that she always resist them and knows that she really does not want to overdose or do anything to hurt herself.  Not having any psychotic symptoms.  Continues to take Luvox.  Patient is complaining that she is having frequent bowel movements to the point that it is disruptive during her day, that she has developed a rash, and that her  hair is falling out.  The rash that she displays is more punctate and mild and does not look like a typical drug rash but she does say that her arms have been itching.  Past Psychiatric History: Patient has a past history of depression and anxiety PTSD OCD suicidal ideation.  Follows up with an outpatient psychiatrist.  Risk to Self:   Risk to Others:   Prior Inpatient Therapy:   Prior Outpatient Therapy:    Past Medical History:  Past Medical History:  Diagnosis Date   Anxiety    Anxiety    Arthritis    Colon polyps    Depression    Epigastric pain    Fatty liver    Frequent PVCs    GERD (gastroesophageal reflux disease)    IBS (irritable bowel syndrome)    Morbid obesity (HCC)    Nausea    PSVT (paroxysmal supraventricular tachycardia) (HCC)    Restless legs    Status post rotator cuff repair     Past Surgical History:  Procedure Laterality Date   ABDOMINAL HYSTERECTOMY     benign Left    breast mass removed   BREAST EXCISIONAL BIOPSY Left 2002   neg   BREAST SURGERY Left 2002   Breast Biopsy   CHOLECYSTECTOMY  2011   COLONOSCOPY WITH ESOPHAGOGASTRODUODENOSCOPY (EGD)     COLONOSCOPY WITH PROPOFOL N/A 08/07/2017   Procedure: COLONOSCOPY WITH PROPOFOL;  Surgeon: Manya Silvas, MD;  Location: [facility] ENDOSCOPY;  Service: Endoscopy;  Laterality: N/A;   ddd     ESOPHAGOGASTRODUODENOSCOPY (EGD) WITH PROPOFOL N/A 08/07/2017  Procedure: ESOPHAGOGASTRODUODENOSCOPY (EGD) WITH PROPOFOL;  Surgeon: Manya Silvas, MD;  Location: [facility] ENDOSCOPY;  Service: Endoscopy;  Laterality: N/A;   KNEE ARTHROSCOPY Left 2008   KNEE ARTHROSCOPY     LAPAROSCOPIC BILATERAL SALPINGO OOPHERECTOMY     OOPHORECTOMY     SHOULDER ACROMIOPLASTY Right 04/06/2015   Procedure: SUBACROMIAL DECOMPRESSION, ROTATOR CUFF REPAIR;  Surgeon: Dereck Leep, MD;  Location: [facility] ORS;  Service: Orthopedics;  Laterality: Right;   Family History:  Family History  Problem Relation Age of Onset   Depression Mother     Depression Sister    Kidney disease Sister    Kidney failure Sister    Depression Brother    Breast cancer Neg Hx    Kidney cancer Neg Hx    Prostate cancer Neg Hx    Family Psychiatric  History: See previous Social History:  Social History   Substance and Sexual Activity  Alcohol Use Yes   Comment: occ.     Social History   Substance and Sexual Activity  Drug Use No    Social History   Socioeconomic History   Marital status: Married    Spouse name: Not on file   Number of children: Not on file   Years of education: Not on file   Highest education level: 10th grade  Occupational History   Not on file  Tobacco Use   Smoking status: Never   Smokeless tobacco: Never  Vaping Use   Vaping Use: Never used  Substance and Sexual Activity   Alcohol use: Yes    Comment: occ.   Drug use: No   Sexual activity: Not Currently  Other Topics Concern   Not on file  Social History Narrative   Not on file   Social Determinants of Health   Financial Resource Strain: Not on file  Food Insecurity: Not on file  Transportation Needs: Not on file  Physical Activity: Not on file  Stress: Not on file  Social Connections: Not on file   Additional Social History:    Allergies:   Allergies  Allergen Reactions   Celexa [Citalopram] Other (See Comments)    "unknown"   Cogentin [Benztropine] Other (See Comments)    "unknown"   Flagyl [Metronidazole] Other (See Comments)    "unknown"   Hydrocodone    Adhesive [Tape] Rash    Labs:  Results for orders placed or performed during the hospital encounter of 03/29/21 (from the past 48 hour(s))  Comprehensive metabolic panel     Status: Abnormal   Collection Time: 03/29/21  6:01 PM  Result Value Ref Range   Sodium 137 135 - 145 mmol/L   Potassium 5.3 (H) 3.5 - 5.1 mmol/L   Chloride 103 98 - 111 mmol/L   CO2 27 22 - 32 mmol/L   Glucose, Bld 115 (H) 70 - 99 mg/dL    Comment: Glucose reference range applies only to samples  taken after fasting for at least 8 hours.   BUN 17 8 - 23 mg/dL   Creatinine, Ser 0.86 0.44 - 1.00 mg/dL   Calcium 9.9 8.9 - 10.3 mg/dL   Total Protein 7.8 6.5 - 8.1 g/dL   Albumin 4.1 3.5 - 5.0 g/dL   AST 31 15 - 41 U/L   ALT 29 0 - 44 U/L   Alkaline Phosphatase 54 38 - 126 U/L   Total Bilirubin 0.7 0.3 - 1.2 mg/dL   GFR, Estimated >60 >60 mL/min    Comment: (NOTE) Calculated using the CKD-EPI  Creatinine Equation (2021)    Anion gap 7 5 - 15    Comment: Performed at [facility], [address] [place], [place] [address]  Ethanol     Status: None   Collection Time: 03/29/21  6:01 PM  Result Value Ref Range   Alcohol, Ethyl (B) <10 <10 mg/dL    Comment: (NOTE) Lowest detectable limit for serum alcohol is 10 mg/dL.  For medical purposes only. Performed at [facility], [address] [place], [place] [address]   Salicylate level     Status: Abnormal   Collection Time: 03/29/21  6:01 PM  Result Value Ref Range   Salicylate Lvl <9.2 (L) 7.0 - 30.0 mg/dL    Comment: Performed at [facility], [address] [place], [place] [address]  Acetaminophen level     Status: Abnormal   Collection Time: 03/29/21  6:01 PM  Result Value Ref Range   Acetaminophen (Tylenol), Serum <10 (L) 10 - 30 ug/mL    Comment: (NOTE) Therapeutic concentrations vary significantly. A range of 10-30 ug/mL  may be an effective concentration for many patients. However, some  are best treated at concentrations outside of this range. Acetaminophen concentrations >150 ug/mL at 4 hours after ingestion  and >50 ug/mL at 12 hours after ingestion are often associated with  toxic reactions.  Performed at [facility], [address] [place], [place] [address]   cbc     Status: Abnormal   Collection Time: 03/29/21  6:01 PM  Result Value Ref Range   WBC 11.2 (H) 4.0 - 10.5 K/uL   RBC 4.68 3.87 - 5.11 MIL/uL   Hemoglobin 13.8 12.0 - 15.0 g/dL   HCT 41.5 36.0 - 46.0 %    MCV 88.7 80.0 - 100.0 fL   MCH 29.5 26.0 - 34.0 pg   MCHC 33.3 30.0 - 36.0 g/dL   RDW 14.9 11.5 - 15.5 %   Platelets 323 150 - 400 K/uL   nRBC 0.0 0.0 - 0.2 %    Comment: Performed at [facility], [address], [place], [place] [address]  Urine Drug Screen, Qualitative     Status: None   Collection Time: 03/29/21  6:01 PM  Result Value Ref Range   Tricyclic, Ur Screen NONE DETECTED NONE DETECTED   Amphetamines, Ur Screen NONE DETECTED NONE DETECTED   MDMA (Ecstasy)Ur Screen NONE DETECTED NONE DETECTED   Cocaine Metabolite,Ur [place] NONE DETECTED NONE DETECTED   Opiate, Ur Screen NONE DETECTED NONE DETECTED   Phencyclidine (PCP) Ur S NONE DETECTED NONE DETECTED   Cannabinoid 50 Ng, Ur [place] NONE DETECTED NONE DETECTED   Barbiturates, Ur Screen NONE DETECTED NONE DETECTED   Benzodiazepine, Ur Scrn NONE DETECTED NONE DETECTED   Methadone Scn, Ur NONE DETECTED NONE DETECTED    Comment: (NOTE) Tricyclics + metabolites, urine    Cutoff 1000 ng/mL Amphetamines + metabolites, urine  Cutoff 1000 ng/mL MDMA (Ecstasy), urine              Cutoff 500 ng/mL Cocaine Metabolite, urine          Cutoff 300 ng/mL Opiate + metabolites, urine        Cutoff 300 ng/mL Phencyclidine (PCP), urine         Cutoff 25 ng/mL Cannabinoid, urine                 Cutoff 50 ng/mL Barbiturates + metabolites, urine  Cutoff 200 ng/mL Benzodiazepine, urine  Cutoff 200 ng/mL Methadone, urine                   Cutoff 300 ng/mL  The urine drug screen provides only a preliminary, unconfirmed analytical test result and should not be used for non-medical purposes. Clinical consideration and professional judgment should be applied to any positive drug screen result due to possible interfering substances. A more specific alternate chemical method must be used in order to obtain a confirmed analytical result. Gas chromatography / mass spectrometry (GC/MS) is the preferred confirm atory method. Performed  at [facility], [address] [place],  [address]     Current Facility-Administered Medications  Medication Dose Route Frequency Provider Last Rate Last Admin   hydrOXYzine (ATARAX/VISTARIL) tablet 50 mg  50 mg Oral Q6H PRN Caroline Sauger, NP   50 mg at 03/30/21 0508   Current Outpatient Medications  Medication Sig Dispense Refill   calcium carbonate (OS-CAL) 600 MG TABS tablet Take 600 mg by mouth daily.     carboxymethylcellulose (REFRESH PLUS) 0.5 % SOLN Apply to eye.     hydrOXYzine (ATARAX/VISTARIL) 50 MG tablet TAKE 1 TABLET BY MOUTH 2 TO 3 TIMES DAILY AS DIRECTED     Magnesium Oxide -Mg Supplement 250 MG TABS Take 250 mg by mouth every morning.      melatonin 10 MG TABS Take 10 mg by mouth at bedtime. 30 tablet 1   metoprolol tartrate (LOPRESSOR) 25 MG tablet Take 0.5 tablets (12.5 mg total) by mouth 2 (two) times daily. 60 tablet 1   omeprazole (PRILOSEC) 40 MG capsule TK 1 C PO QD     pyridOXINE (VITAMIN B-6) 100 MG tablet Take 100 mg by mouth daily.     traZODone (DESYREL) 100 MG tablet Take 1 tablet (100 mg total) by mouth at bedtime as needed for sleep. 30 tablet 3   fluvoxaMINE (LUVOX) 100 MG tablet Take 2 tablets (200 mg total) by mouth at bedtime. 60 tablet 1   gabapentin (NEURONTIN) 100 MG capsule Take 100 mg by mouth 2 (two) times daily. (Patient not taking: No sig reported)  11    Musculoskeletal: Strength & Muscle Tone: within normal limits Gait & Station: normal Patient leans: N/A            Psychiatric Specialty Exam:  Presentation  General Appearance: Appropriate for Environment  Eye Contact:Good  Speech:Clear and Coherent; Normal Rate  Speech Volume:Normal  Handedness:Right   Mood and Affect  Mood:Anxious; Depressed  Affect:Depressed; Full Range   Thought Process  Thought Processes:Coherent; Goal Directed  Descriptions of Associations:Intact  Orientation:Full (Time, Place and Person)  Thought  Content:Logical  History of Schizophrenia/Schizoaffective disorder:No  Duration of Psychotic Symptoms:No data recorded Hallucinations:Hallucinations: None  Ideas of Reference:None  Suicidal Thoughts:Suicidal Thoughts: Yes, Passive SI Passive Intent and/or Plan: With Intent; With Plan; With Means to Carry Out  Homicidal Thoughts:Homicidal Thoughts: No   Sensorium  Memory:Immediate Good; Recent Good; Remote Good  Judgment:Intact  Insight:Poor   Executive Functions  Concentration:Fair  Attention Span:Fair  [place]   Psychomotor Activity  Psychomotor Activity:Psychomotor Activity: Normal   Assets  Assets:Communication Skills; Desire for Improvement; Financial Resources/Insurance; Intimacy; Physical Health; Resilience; Social Support   Sleep  Sleep:Sleep: Good Number of Hours of Sleep: 8   Physical Exam: Physical Exam Vitals and nursing note reviewed.  Constitutional:      Appearance: Normal appearance.  HENT:     Head: Normocephalic and atraumatic.  Mouth/Throat:     Pharynx: Oropharynx is clear.  Eyes:     Pupils: Pupils are equal, round, and reactive to light.  Cardiovascular:     Rate and Rhythm: Normal rate and regular rhythm.  Pulmonary:     Effort: Pulmonary effort is normal.     Breath sounds: Normal breath sounds.  Abdominal:     General: Abdomen is flat.     Palpations: Abdomen is soft.  Musculoskeletal:        General: Normal range of motion.  Skin:    General: Skin is warm and dry.  Neurological:     General: No focal deficit present.     Mental Status: She is alert. Mental status is at baseline.  Psychiatric:        Attention and Perception: Attention normal.        Mood and Affect: Mood is anxious and depressed.        Speech: Speech normal.        Behavior: Behavior is cooperative.        Thought Content: Thought content includes suicidal ideation. Thought content does not include  suicidal plan.        Cognition and Memory: Cognition normal.        Judgment: Judgment normal.   Review of Systems  Constitutional: Negative.   HENT: Negative.    Eyes: Negative.   Respiratory: Negative.    Cardiovascular: Negative.   Gastrointestinal: Negative.   Musculoskeletal: Negative.   Skin: Negative.   Neurological: Negative.   Psychiatric/Behavioral:  Positive for depression and suicidal ideas. Negative for hallucinations, memory loss and substance abuse. The patient is nervous/anxious. The patient does not have insomnia.   Blood pressure 139/83, pulse (!) 117, temperature 98.1 F (36.7 C), temperature source Oral, resp. rate 18, height 4\' 11"  (1.499 m), weight 68 kg, SpO2 94 %. Body mass index is 30.3 kg/m.  Treatment Plan Summary: Plan patient will agreed to increasing dose of Luvox from 150 mg a day to 200 mg a day taken at nighttime.  Patient feels certain that the BuSpar is causing side effects and I have said that while I am not sure that that is the case she can stop taking the BuSpar which is a low dose safely.  Patient says she feels like she will be safe going home.  Has no plan or intent to hurt herself.  Has an appointment scheduled with her regular psychiatrist next week which she will follow up with.  Feels capable to get help or even return to the hospital if suicidal ideation becomes more pressing.  At this point then patient does not meet commitment criteria to force her to go to the hospital that she does not want to go to.  Prescription provided for higher dose of Luvox.  Case reviewed with ER physician.  Patient can be discharged home.  Disposition:  see note  Alethia Berthold, MD 03/30/2021 11:30 AM

## 2021-03-30 NOTE — ED Notes (Signed)
Pt speaking with Dr Weber Cooks at this time

## 2021-03-30 NOTE — ED Notes (Signed)
SAFE  TRANSPORT  [facility]  PER  NURSE  ARIEL  RN

## 2021-03-30 NOTE — ED Notes (Signed)
Pt changing into personal clothes now.

## 2021-03-30 NOTE — BH Assessment (Signed)
Writer spoke with [facility] [phone]) and informed them the patient wasn't coming.

## 2021-03-30 NOTE — Discharge Instructions (Signed)
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
## 2021-04-04 ENCOUNTER — Other Ambulatory Visit: Payer: Self-pay

## 2021-04-04 ENCOUNTER — Encounter: Payer: Self-pay | Admitting: Psychiatry

## 2021-04-04 ENCOUNTER — Telehealth (INDEPENDENT_AMBULATORY_CARE_PROVIDER_SITE_OTHER): Payer: PPO | Admitting: Psychiatry

## 2021-04-04 DIAGNOSIS — G4700 Insomnia, unspecified: Secondary | ICD-10-CM | POA: Diagnosis not present

## 2021-04-04 DIAGNOSIS — F3341 Major depressive disorder, recurrent, in partial remission: Secondary | ICD-10-CM

## 2021-04-04 DIAGNOSIS — F4310 Post-traumatic stress disorder, unspecified: Secondary | ICD-10-CM | POA: Diagnosis not present

## 2021-04-04 NOTE — Patient Instructions (Signed)
Continue Fluvoxamine 200 mg a night Hold Buspar Continue Trazodone 100 mg at night Referral to IOP Next appointment: 11/21 at 11:30

## 2021-04-07 ENCOUNTER — Telehealth: Payer: Self-pay

## 2021-04-07 ENCOUNTER — Other Ambulatory Visit: Payer: Self-pay | Admitting: Psychiatry

## 2021-04-07 MED ORDER — HYDROXYZINE HCL 25 MG PO TABS: 25.0000 mg | ORAL_TABLET | Freq: Every day | ORAL | 0 refills | Status: DC | PRN

## 2021-04-07 NOTE — Telephone Encounter (Signed)
Ordered. Side effects including drowsiness.

## 2021-04-07 NOTE — Telephone Encounter (Signed)
spoke with patient and she stated the last time she spoke with you that she could try that and see if it would help.  she stated that she like to try medication.

## 2021-04-07 NOTE — Telephone Encounter (Signed)
pt called left a message that she wanted a refill on the hydroxyzine

## 2021-04-07 NOTE — Telephone Encounter (Signed)
hy

## 2021-04-17 ENCOUNTER — Telehealth (HOSPITAL_COMMUNITY): Payer: Self-pay | Admitting: Psychiatry
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
## 2021-04-24 ENCOUNTER — Other Ambulatory Visit: Payer: Self-pay

## 2021-04-24 ENCOUNTER — Encounter: Payer: Self-pay | Admitting: Psychiatry

## 2021-04-24 ENCOUNTER — Ambulatory Visit (INDEPENDENT_AMBULATORY_CARE_PROVIDER_SITE_OTHER): Payer: PPO | Admitting: Psychiatry

## 2021-04-24 VITALS — BP 162/77 | HR 106 | Temp 98.5°F | Wt 163.8 lb

## 2021-04-24 DIAGNOSIS — G4700 Insomnia, unspecified: Secondary | ICD-10-CM | POA: Diagnosis not present

## 2021-04-24 DIAGNOSIS — F3341 Major depressive disorder, recurrent, in partial remission: Secondary | ICD-10-CM

## 2021-04-24 DIAGNOSIS — F4310 Post-traumatic stress disorder, unspecified: Secondary | ICD-10-CM

## 2021-04-24 MED ORDER — HYDROXYZINE HCL 25 MG PO TABS: 25.0000 mg | ORAL_TABLET | Freq: Every day | ORAL | 0 refills | Status: AC | PRN

## 2021-04-24 NOTE — Patient Instructions (Signed)
Continue Fluvoxamine 200 mg a night Continue Trazodone 100 mg at night Next appointment: 12/12 at 2 PM

## 2021-04-25 DIAGNOSIS — F39 Unspecified mood [affective] disorder: Secondary | ICD-10-CM | POA: Diagnosis not present

## 2021-04-25 DIAGNOSIS — F332 Major depressive disorder, recurrent severe without psychotic features: Secondary | ICD-10-CM | POA: Diagnosis not present

## 2021-04-25 DIAGNOSIS — F4312 Post-traumatic stress disorder, chronic: Secondary | ICD-10-CM | POA: Diagnosis not present

## 2021-05-02 ENCOUNTER — Telehealth (HOSPITAL_COMMUNITY): Payer: Self-pay | Admitting: Psychiatry
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
## 2021-05-15 ENCOUNTER — Ambulatory Visit (INDEPENDENT_AMBULATORY_CARE_PROVIDER_SITE_OTHER): Payer: PPO | Admitting: Psychiatry

## 2021-05-15 ENCOUNTER — Emergency Department
Admission: EM | Admit: 2021-05-15 | Discharge: 2021-05-16 | Disposition: A | Discharge status: Other Acute Inpatient Hospital | Payer: PPO | Attending: Emergency Medicine | Admitting: Emergency Medicine

## 2021-05-15 ENCOUNTER — Encounter: Payer: Self-pay | Admitting: Psychiatry

## 2021-05-15 ENCOUNTER — Other Ambulatory Visit: Payer: Self-pay

## 2021-05-15 VITALS — BP 138/70 | HR 153 | Temp 98.7°F | Wt 153.0 lb

## 2021-05-15 DIAGNOSIS — R4189 Other symptoms and signs involving cognitive functions and awareness: Secondary | ICD-10-CM | POA: Diagnosis present

## 2021-05-15 DIAGNOSIS — F332 Major depressive disorder, recurrent severe without psychotic features: Secondary | ICD-10-CM

## 2021-05-15 DIAGNOSIS — Y900 Blood alcohol level of less than 20 mg/100 ml: Secondary | ICD-10-CM | POA: Diagnosis not present

## 2021-05-15 DIAGNOSIS — F4310 Post-traumatic stress disorder, unspecified: Secondary | ICD-10-CM | POA: Diagnosis not present

## 2021-05-15 DIAGNOSIS — F32A Depression, unspecified: Secondary | ICD-10-CM | POA: Diagnosis not present

## 2021-05-15 DIAGNOSIS — M5136 Other intervertebral disc degeneration, lumbar region: Secondary | ICD-10-CM | POA: Insufficient documentation

## 2021-05-15 DIAGNOSIS — R45851 Suicidal ideations: Secondary | ICD-10-CM

## 2021-05-15 DIAGNOSIS — N3941 Urge incontinence: Secondary | ICD-10-CM | POA: Diagnosis present

## 2021-05-15 DIAGNOSIS — Z20822 Contact with and (suspected) exposure to covid-19: Secondary | ICD-10-CM | POA: Diagnosis not present

## 2021-05-15 DIAGNOSIS — G3184 Mild cognitive impairment, so stated: Secondary | ICD-10-CM | POA: Insufficient documentation

## 2021-05-15 DIAGNOSIS — I10 Essential (primary) hypertension: Secondary | ICD-10-CM | POA: Insufficient documentation

## 2021-05-15 DIAGNOSIS — F429 Obsessive-compulsive disorder, unspecified: Secondary | ICD-10-CM | POA: Diagnosis not present

## 2021-05-15 DIAGNOSIS — R350 Frequency of micturition: Secondary | ICD-10-CM | POA: Diagnosis not present

## 2021-05-15 DIAGNOSIS — G4709 Other insomnia: Secondary | ICD-10-CM | POA: Diagnosis not present

## 2021-05-15 DIAGNOSIS — E782 Mixed hyperlipidemia: Secondary | ICD-10-CM | POA: Diagnosis present

## 2021-05-15 DIAGNOSIS — M51369 Other intervertebral disc degeneration, lumbar region without mention of lumbar back pain or lower extremity pain: Secondary | ICD-10-CM | POA: Diagnosis present

## 2021-05-15 LAB — COMPREHENSIVE METABOLIC PANEL
ALT: 28 U/L (ref 0–44)
AST: 30 U/L (ref 15–41)
Albumin: 4.2 g/dL (ref 3.5–5.0)
Alkaline Phosphatase: 54 U/L (ref 38–126)
Anion gap: 10 (ref 5–15)
BUN: 19 mg/dL (ref 8–23)
CO2: 25 mmol/L (ref 22–32)
Calcium: 9.3 mg/dL (ref 8.9–10.3)
Chloride: 102 mmol/L (ref 98–111)
Creatinine, Ser: 0.85 mg/dL (ref 0.44–1.00)
GFR, Estimated: >60 mL/min (ref >60)
Glucose, Bld: 118 mg/dL — ABNORMAL HIGH (ref 70–99)
Potassium: 4.1 mmol/L (ref 3.5–5.1)
Sodium: 137 mmol/L (ref 135–145)
Total Bilirubin: 0.5 mg/dL (ref 0.3–1.2)
Total Protein: 8.2 g/dL — ABNORMAL HIGH (ref 6.5–8.1)

## 2021-05-15 LAB — URINE DRUG SCREEN, QUALITATIVE (ARMC ONLY)
Amphetamines, Ur Screen: NOT DETECTED
Barbiturates, Ur Screen: NOT DETECTED
Benzodiazepine, Ur Scrn: NOT DETECTED
Cannabinoid 50 Ng, Ur ~~LOC~~: NOT DETECTED
Cocaine Metabolite,Ur ~~LOC~~: NOT DETECTED
MDMA (Ecstasy)Ur Screen: NOT DETECTED
Methadone Scn, Ur: NOT DETECTED
Opiate, Ur Screen: NOT DETECTED
Phencyclidine (PCP) Ur S: NOT DETECTED
Tricyclic, Ur Screen: NOT DETECTED

## 2021-05-15 LAB — CBC
HCT: 42.9 % (ref 36.0–46.0)
Hemoglobin: 13.9 g/dL (ref 12.0–15.0)
MCH: 28.7 pg (ref 26.0–34.0)
MCHC: 32.4 g/dL (ref 30.0–36.0)
MCV: 88.6 fL (ref 80.0–100.0)
Platelets: 311 10*3/uL (ref 150–400)
RBC: 4.84 MIL/uL (ref 3.87–5.11)
RDW: 13.8 % (ref 11.5–15.5)
WBC: 12.2 10*3/uL — ABNORMAL HIGH (ref 4.0–10.5)
nRBC: 0 % (ref 0.0–0.2)

## 2021-05-15 LAB — RESP PANEL BY RT-PCR (FLU A&B, COVID) ARPGX2
Influenza A by PCR: NEGATIVE
Influenza B by PCR: NEGATIVE
SARS Coronavirus 2 by RT PCR: NEGATIVE

## 2021-05-15 LAB — ETHANOL: Alcohol, Ethyl (B): <10 mg/dL (ref <10)

## 2021-05-15 LAB — SALICYLATE LEVEL: Salicylate Lvl: <7 mg/dL — ABNORMAL LOW (ref 7.0–30.0)

## 2021-05-15 LAB — ACETAMINOPHEN LEVEL: Acetaminophen (Tylenol), Serum: <10 ug/mL — ABNORMAL LOW (ref 10–30)

## 2021-05-15 MED ORDER — METOPROLOL TARTRATE 25 MG PO TABS
12.5000 mg | ORAL_TABLET | Freq: Two times a day (BID) | ORAL | Status: DC
  Administered 2021-05-15 – 2021-05-16 (×2): 12.5 mg via ORAL
  Filled 2021-05-15 (×2): qty 1

## 2021-05-15 MED ORDER — CALCIUM CARBONATE 600 MG PO TABS: 600.0000 mg | ORAL_TABLET | Freq: Every day | ORAL | Status: DC

## 2021-05-15 MED ORDER — FLUVOXAMINE MALEATE 50 MG PO TABS
200.0000 mg | ORAL_TABLET | Freq: Every day | ORAL | Status: DC
  Administered 2021-05-15: 200 mg via ORAL
  Filled 2021-05-15 (×2): qty 4

## 2021-05-15 MED ORDER — AMLODIPINE BESYLATE 5 MG PO TABS
5.0000 mg | ORAL_TABLET | Freq: Every day | ORAL | Status: DC
  Administered 2021-05-15 – 2021-05-16 (×2): 5 mg via ORAL
  Filled 2021-05-15 (×2): qty 1

## 2021-05-15 MED ORDER — HYDROXYZINE HCL 25 MG PO TABS
25.0000 mg | ORAL_TABLET | Freq: Every day | ORAL | Status: DC | PRN
  Administered 2021-05-16: 25 mg via ORAL
  Filled 2021-05-15: qty 1

## 2021-05-15 MED ORDER — MAGNESIUM OXIDE -MG SUPPLEMENT 400 (240 MG) MG PO TABS
200.0000 mg | ORAL_TABLET | ORAL | Status: DC
  Administered 2021-05-16: 200 mg via ORAL
  Filled 2021-05-15: qty 1

## 2021-05-15 MED ORDER — GABAPENTIN 100 MG PO CAPS
100.0000 mg | ORAL_CAPSULE | Freq: Two times a day (BID) | ORAL | Status: DC
  Administered 2021-05-16: 100 mg via ORAL
  Filled 2021-05-15 (×2): qty 1

## 2021-05-15 MED ORDER — CALCIUM CARBONATE 1250 (500 CA) MG PO TABS
500.0000 mg | ORAL_TABLET | Freq: Every day | ORAL | Status: DC
Start: 2021-05-16 — End: 2021-05-16
  Administered 2021-05-16: 500 mg via ORAL
  Filled 2021-05-15: qty 1

## 2021-05-15 MED ORDER — VITAMIN B-6 50 MG PO TABS
100.0000 mg | ORAL_TABLET | Freq: Every day | ORAL | Status: DC
  Administered 2021-05-15 – 2021-05-16 (×2): 100 mg via ORAL
  Filled 2021-05-15 (×2): qty 2

## 2021-05-15 MED ORDER — TRAZODONE HCL 100 MG PO TABS: 100.0000 mg | ORAL_TABLET | Freq: Every evening | ORAL | Status: DC | PRN

## 2021-05-15 MED ORDER — MELATONIN 5 MG PO TABS
10.0000 mg | ORAL_TABLET | Freq: Every day | ORAL | Status: DC
  Administered 2021-05-15: 10 mg via ORAL
  Filled 2021-05-15: qty 2

## 2021-05-15 MED ORDER — PANTOPRAZOLE SODIUM 40 MG PO TBEC
80.0000 mg | DELAYED_RELEASE_TABLET | Freq: Every day | ORAL | Status: DC
  Administered 2021-05-16: 80 mg via ORAL
  Filled 2021-05-15 (×2): qty 2

## 2021-05-15 NOTE — ED Triage Notes (Signed)
Per pt husband, pt has been having depression and is wanting to talk with someone, states doesn't feel like her meds are right, states she has had thoughts of harming herself, states   States she took a handful of the hydroxyzine a couple days ago for her anxiety, states she didn't do it to kill herself

## 2021-05-15 NOTE — ED Notes (Signed)
Urine collected for UDS.

## 2021-05-15 NOTE — ED Provider Notes (Signed)
  Emergency Medicine Provider Triage Evaluation Note  Joanna Mendoza , a 75 y.o.female,  was evaluated in triage.  Pt complains of anxiety/depression.  Patient states that she has been having depression thoughts of harming herself.  She says she does not feel like her medications were not working appropriately.   Review of Systems  Positive: None.  Negative: Denies fever, chest pain, vomiting  Physical Exam  There were no vitals filed for this visit. Gen:   Awake, uncomfortable Resp:  Normal effort  MSK:   Moves extremities without difficulty  Other:    Medical Decision Making  Given the patient's initial medical screening exam, the following diagnostic evaluation has been ordered. The patient will be placed in the appropriate treatment space, once one is available, to complete the evaluation and treatment. I have discussed the plan of care with the patient and I have advised the patient that an ED physician or mid-level practitioner will reevaluate their condition after the test results have been received, as the results may give them additional insight into the type of treatment they may need.    Diagnostics: CBC, acetaminophen/salicylate level, ethanol, urine drug screen.  Treatments: none immediately   Teodoro Spray, [place] 05/15/21 1636    Nance Pear, MD 05/15/21 2021

## 2021-05-15 NOTE — ED Notes (Signed)
Pt. Dressed into burgundy scrubs. Belongings include: shoes, underwear, bra, gray pants, striped shirt, socks, gray jacket, and a carryon bag, and a purse.

## 2021-05-15 NOTE — ED Notes (Signed)
VOL  PENDING  PLACEMENT 

## 2021-05-15 NOTE — ED Notes (Signed)
Pt was given dinner tray.  

## 2021-05-15 NOTE — ED Notes (Signed)
Pt provided additional water. Denied need for anything else at this time. Denies need for trazodone for sleep due to Melatonin. Pt reported does not take Gabapentin anymore.

## 2021-05-15 NOTE — ED Provider Notes (Signed)
[facility] Emergency Department Provider Note   ____________________________________________   Event Date/Time   First MD Initiated Contact with Patient 05/15/21 1835     (approximate)  I have reviewed the triage vital signs and the nursing notes.   HISTORY  Chief Complaint Psychiatric Evaluation    HPI Joanna Mendoza is a 75 y.o. female with past medical history of hypertension, hyperlipidemia, SVT, IBS, anxiety, and depression who presents to the ED for psychiatric evaluation.  Patient reports that she has been feeling increasingly depressed lately, has been under stress after allowing her stepson to live with her.  She states that he blames everything on her and that she had a "nervous breakdown" 2 nights ago.  She states that she had thoughts of ending her life at that time and took extra doses of her hydroxyzine.  She reports taking as many as 8 hydroxyzine tablets, has been feeling fine since then and states she no longer feels suicidal but continues to feel depressed.  She denies any auditory or visual hallucinations and denies any homicidal ideation.  She denies any alcohol or drug use.        Past Medical History:  Diagnosis Date   Anxiety    Anxiety    Arthritis    Colon polyps    Depression    Epigastric pain    Fatty liver    Frequent PVCs    GERD (gastroesophageal reflux disease)    IBS (irritable bowel syndrome)    Morbid obesity (HCC)    Nausea    PSVT (paroxysmal supraventricular tachycardia) (HCC)    Restless legs    Status post rotator cuff repair     Patient Active Problem List   Diagnosis Date Noted   OCD (obsessive compulsive disorder) 03/30/2021   Degenerative disc disease, lumbar 12/30/2020   Osteopenia 12/30/2020   Cognitive impairment 12/30/2020   Severe recurrent major depression without psychotic features ([place]) 12/28/2020   Suicidal ideation 12/28/2020   PTSD (post-traumatic stress disorder) 12/28/2020    Primary osteoarthritis of left knee 02/09/2019   Renal cyst 07/30/2017   Urge incontinence 07/30/2017   Urinary urgency 07/30/2017   Urinary frequency 07/30/2017   Microhematuria 07/30/2017   Hx of adenomatous colonic polyps 07/08/2017   Other insomnia 11/22/2016   Frequent PVCs 08/22/2016   PSVT (paroxysmal supraventricular tachycardia) ([place]) 06/27/2016   Chronic RUQ pain 04/01/2014   Mixed hyperlipidemia 12/11/2013    Past Surgical History:  Procedure Laterality Date   ABDOMINAL HYSTERECTOMY     benign Left    breast mass removed   BREAST EXCISIONAL BIOPSY Left 2002   neg   BREAST SURGERY Left 2002   Breast Biopsy   CHOLECYSTECTOMY  2011   COLONOSCOPY WITH ESOPHAGOGASTRODUODENOSCOPY (EGD)     COLONOSCOPY WITH PROPOFOL N/A 08/07/2017   Procedure: COLONOSCOPY WITH PROPOFOL;  Surgeon: Manya Silvas, MD;  Location: [facility] ENDOSCOPY;  Service: Endoscopy;  Laterality: N/A;   ddd     ESOPHAGOGASTRODUODENOSCOPY (EGD) WITH PROPOFOL N/A 08/07/2017   Procedure: ESOPHAGOGASTRODUODENOSCOPY (EGD) WITH PROPOFOL;  Surgeon: Manya Silvas, MD;  Location: [facility] ENDOSCOPY;  Service: Endoscopy;  Laterality: N/A;   KNEE ARTHROSCOPY Left 2008   KNEE ARTHROSCOPY     LAPAROSCOPIC BILATERAL SALPINGO OOPHERECTOMY     OOPHORECTOMY     SHOULDER ACROMIOPLASTY Right 04/06/2015   Procedure: SUBACROMIAL DECOMPRESSION, ROTATOR CUFF REPAIR;  Surgeon: Dereck Leep, MD;  Location: [facility] ORS;  Service: Orthopedics;  Laterality: Right;    Prior  to Admission medications   Medication Sig Start Date End Date Taking? Authorizing Provider  amLODipine (NORVASC) 5 MG tablet Take 5 mg by mouth daily.    [provider]  calcium carbonate (OS-CAL) 600 MG TABS tablet Take 600 mg by mouth daily.    [provider]  carboxymethylcellulose (REFRESH PLUS) 0.5 % SOLN Apply to eye.    [provider]  fluvoxaMINE (LUVOX) 100 MG tablet Take 2 tablets (200 mg total) by mouth at bedtime. 03/30/21  07/28/21  Clapacs, Madie Reno, MD  gabapentin (NEURONTIN) 100 MG capsule Take 100 mg by mouth 2 (two) times daily. 07/04/17   [provider]  hydrOXYzine (ATARAX/VISTARIL) 25 MG tablet Take 1 tablet (25 mg total) by mouth daily as needed for anxiety. 05/08/21 06/07/21  Norman Clay, MD  Magnesium Oxide -Mg Supplement 250 MG TABS Take 250 mg by mouth every morning.     [provider]  melatonin 10 MG TABS Take 10 mg by mouth at bedtime. 01/06/21   Salley Scarlet, MD  metoprolol tartrate (LOPRESSOR) 25 MG tablet Take 0.5 tablets (12.5 mg total) by mouth 2 (two) times daily. 01/06/21   Salley Scarlet, MD  omeprazole (PRILOSEC) 40 MG capsule TK 1 C PO QD 05/17/18   [provider]  pyridOXINE (VITAMIN B-6) 100 MG tablet Take 100 mg by mouth daily.    [provider]  traZODone (DESYREL) 100 MG tablet Take 1 tablet (100 mg total) by mouth at bedtime as needed for sleep. 03/08/21 07/06/21  Norman Clay, MD    Allergies Celexa [citalopram], Cogentin [benztropine], Flagyl [metronidazole], Hydrocodone, and Adhesive [tape]  Family History  Problem Relation Age of Onset   Depression Mother    Depression Sister    Kidney disease Sister    Kidney failure Sister    Depression Brother    Breast cancer Neg Hx    Kidney cancer Neg Hx    Prostate cancer Neg Hx     Social History Social History   Tobacco Use   Smoking status: Never   Smokeless tobacco: Never  Vaping Use   Vaping Use: Never used  Substance Use Topics   Alcohol use: Yes    Comment: occ.   Drug use: No    Review of Systems  Constitutional: No fever/chills Eyes: No visual changes. ENT: No sore throat. Cardiovascular: Denies chest pain. Respiratory: Denies shortness of breath. Gastrointestinal: No abdominal pain.  No nausea, no vomiting.  No diarrhea.  No constipation. Genitourinary: Negative for dysuria. Musculoskeletal: Negative for back pain. Skin: Negative for rash. Neurological: Negative  for headaches, focal weakness or numbness.  Positive for depression.  ____________________________________________   PHYSICAL EXAM:  VITAL SIGNS: ED Triage Vitals [05/15/21 1645]  Enc Vitals Group     BP 133/85     Pulse Rate (!) 120     Resp 16     Temp 98.5 F (36.9 C)     Temp Source Oral     SpO2 94 %     Weight      Height      Head Circumference      Peak Flow      Pain Score      Pain Loc      Pain Edu?      Excl. in [place]?     Constitutional: Alert and oriented. Eyes: Conjunctivae are normal. Head: Atraumatic. Nose: No congestion/rhinnorhea. Mouth/Throat: Mucous membranes are moist. Neck: Normal ROM Cardiovascular: Normal rate, regular rhythm.  Grossly normal heart sounds. Respiratory: Normal respiratory effort.  No retractions. Lungs CTAB. Gastrointestinal: Soft and nontender. No distention. Genitourinary: deferred Musculoskeletal: No lower extremity tenderness nor edema. Neurologic:  Normal speech and language. No gross focal neurologic deficits are appreciated. Skin:  Skin is warm, dry and intact. No rash noted. Psychiatric: Mood and affect are normal. Speech and behavior are normal.  ____________________________________________   LABS (all labs ordered are listed, but only abnormal results are displayed)  Labs Reviewed  COMPREHENSIVE METABOLIC PANEL - Abnormal; Notable for the following components:      Result Value   Glucose, Bld 118 (*)    Total Protein 8.2 (*)    All other components within normal limits  SALICYLATE LEVEL - Abnormal; Notable for the following components:   Salicylate Lvl <1.1 (*)    All other components within normal limits  ACETAMINOPHEN LEVEL - Abnormal; Notable for the following components:   Acetaminophen (Tylenol), Serum <10 (*)    All other components within normal limits  CBC - Abnormal; Notable for the following components:   WBC 12.2 (*)    All other components within normal limits  RESP PANEL BY RT-PCR (FLU A&B,  COVID) ARPGX2  ETHANOL  URINE DRUG SCREEN, QUALITATIVE ([facility] ONLY)    PROCEDURES  Procedure(s) performed (including Critical Care):  Procedures   ____________________________________________   INITIAL IMPRESSION / ASSESSMENT AND PLAN / ED COURSE      75 year old female with past medical history of hypertension, hyperlipidemia, SVT, IBS, anxiety, and depression who presents to the ED complaining of increasing depression over the past few days including "nervous breakdown" 2 days ago when she had thoughts of harming herself.  She reports taking extra doses of her hydroxyzine then, however it has been multiple days and she is asymptomatic related to this.  Screening labs are unremarkable and patient may be medically cleared for psychiatric evaluation.  We will hold off on IVC as patient is calm and cooperative, denies any intent to harm herself at this time.  Patient's home medications were ordered and psych consult is pending.  The patient has been placed in psychiatric observation due to the need to provide a safe environment for the patient while obtaining psychiatric consultation and evaluation, as well as ongoing medical and medication management to treat the patient's condition.  The patient has not been placed under full IVC at this time.       ____________________________________________   FINAL CLINICAL IMPRESSION(S) / ED DIAGNOSES  Final diagnoses:  Depression, unspecified depression type     ED Discharge Orders     None        Note:  This document was prepared using Dragon voice recognition software and may include unintentional dictation errors.    Blake Divine, MD 05/15/21 1901

## 2021-05-16 DIAGNOSIS — K219 Gastro-esophageal reflux disease without esophagitis: Secondary | ICD-10-CM | POA: Diagnosis not present

## 2021-05-16 DIAGNOSIS — R45851 Suicidal ideations: Secondary | ICD-10-CM | POA: Diagnosis not present

## 2021-05-16 DIAGNOSIS — F332 Major depressive disorder, recurrent severe without psychotic features: Secondary | ICD-10-CM | POA: Diagnosis not present

## 2021-05-16 DIAGNOSIS — F32A Depression, unspecified: Secondary | ICD-10-CM

## 2021-05-16 DIAGNOSIS — F4310 Post-traumatic stress disorder, unspecified: Secondary | ICD-10-CM | POA: Diagnosis not present

## 2021-05-16 DIAGNOSIS — F411 Generalized anxiety disorder: Secondary | ICD-10-CM | POA: Diagnosis not present

## 2021-05-16 DIAGNOSIS — I10 Essential (primary) hypertension: Secondary | ICD-10-CM | POA: Diagnosis not present

## 2021-05-16 DIAGNOSIS — Z62810 Personal history of physical and sexual abuse in childhood: Secondary | ICD-10-CM | POA: Diagnosis not present

## 2021-05-16 DIAGNOSIS — E785 Hyperlipidemia, unspecified: Secondary | ICD-10-CM | POA: Diagnosis not present

## 2021-05-16 NOTE — ED Notes (Addendum)
Per [place] pt acceptance after 9am 12/13

## 2021-05-16 NOTE — BH Assessment (Signed)
PATIENT BED AVAILABLE AFTER 9AM ON 05/16/21  Patient has been accepted to [facility].  Patient assigned to [place] physician is Dr. Elaina Hoops.  Call report to [phone].  Representative was [employer].   ER Staff is aware of it:  [facility] ER Secretary  Dr. Starleen Blue, ER MD  Minette Brine Patient's Nurse

## 2021-05-16 NOTE — ED Notes (Signed)
Reece Packer from [facility] contacted this RN for possible pt placement.
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
## 2021-05-16 NOTE — BH Assessment (Signed)
Referral information for Psychiatric Hospitalization faxed to;   [facility] [phone] -[phone]) [phone]  [facility] [phone]),  [place] [phone] -or- [phone]),   [facility] [phone])  [place] [phone] or [phone]),

## 2021-05-17 DIAGNOSIS — F332 Major depressive disorder, recurrent severe without psychotic features: Secondary | ICD-10-CM | POA: Diagnosis not present

## 2021-05-18 DIAGNOSIS — F332 Major depressive disorder, recurrent severe without psychotic features: Secondary | ICD-10-CM | POA: Diagnosis not present

## 2021-05-19 DIAGNOSIS — F332 Major depressive disorder, recurrent severe without psychotic features: Secondary | ICD-10-CM | POA: Diagnosis not present

## 2021-05-20 DIAGNOSIS — F332 Major depressive disorder, recurrent severe without psychotic features: Secondary | ICD-10-CM | POA: Diagnosis not present

## 2021-05-21 DIAGNOSIS — F332 Major depressive disorder, recurrent severe without psychotic features: Secondary | ICD-10-CM | POA: Diagnosis not present

## 2021-05-22 DIAGNOSIS — F332 Major depressive disorder, recurrent severe without psychotic features: Secondary | ICD-10-CM | POA: Diagnosis not present

## 2021-05-23 DIAGNOSIS — F332 Major depressive disorder, recurrent severe without psychotic features: Secondary | ICD-10-CM | POA: Diagnosis not present

## 2021-05-30 ENCOUNTER — Telehealth: Payer: PPO | Admitting: Psychiatry

## 2021-05-30 ENCOUNTER — Other Ambulatory Visit: Payer: Self-pay

## 2021-05-31 DIAGNOSIS — F3342 Major depressive disorder, recurrent, in full remission: Secondary | ICD-10-CM | POA: Diagnosis not present

## 2021-05-31 DIAGNOSIS — J069 Acute upper respiratory infection, unspecified: Secondary | ICD-10-CM | POA: Diagnosis not present

## 2021-06-06 ENCOUNTER — Telehealth: Payer: Self-pay | Admitting: Psychiatry

## 2021-06-06 NOTE — Telephone Encounter (Signed)
Received a discharge summary from [place]. Hospital course was not available in the note. The record will be sent for scan.   Medication- fluoxetine 40 mg daily, prazosin 1 mg at night, quetiapine 25 mg bid, 50 mg qhs, trazodone 100 mg qhsprn for insomnia.  Other meds- amlodipine 5 mg daily, metoprolol 25 mg daily, omeprazole 40 mg daily Fluvoxamine, gabapentin, hydroxyzine was discontinued.

## 2021-06-12 DIAGNOSIS — F332 Major depressive disorder, recurrent severe without psychotic features: Secondary | ICD-10-CM | POA: Diagnosis not present

## 2021-06-12 DIAGNOSIS — F39 Unspecified mood [affective] disorder: Secondary | ICD-10-CM | POA: Diagnosis not present

## 2021-06-12 DIAGNOSIS — F4312 Post-traumatic stress disorder, chronic: Secondary | ICD-10-CM | POA: Diagnosis not present

## 2021-06-13 ENCOUNTER — Telehealth: Payer: Self-pay

## 2021-06-13 NOTE — Telephone Encounter (Signed)
Could you ask her which medication she is referring to. I believe there has been medication change since her recent admission, fyi.

## 2021-06-13 NOTE — Telephone Encounter (Signed)
pt left message that she needs refills on her medication she is almost out.
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
## 2021-06-21 ENCOUNTER — Telehealth (INDEPENDENT_AMBULATORY_CARE_PROVIDER_SITE_OTHER): Payer: PPO | Admitting: Psychiatry

## 2021-06-21 ENCOUNTER — Other Ambulatory Visit: Payer: Self-pay

## 2021-06-21 ENCOUNTER — Encounter: Payer: Self-pay | Admitting: Psychiatry

## 2021-06-21 VITALS — BP 134/83 | HR 99 | Temp 98.1°F | Ht <= 58 in | Wt 165.4 lb

## 2021-06-21 DIAGNOSIS — F330 Major depressive disorder, recurrent, mild: Secondary | ICD-10-CM

## 2021-06-21 DIAGNOSIS — F4310 Post-traumatic stress disorder, unspecified: Secondary | ICD-10-CM

## 2021-06-21 MED ORDER — LORAZEPAM 0.5 MG PO TABS: 0.5000 mg | ORAL_TABLET | Freq: Every day | ORAL | 0 refills | Status: DC | PRN

## 2021-06-21 MED ORDER — FLUVOXAMINE MALEATE 100 MG PO TABS: 200.0000 mg | ORAL_TABLET | Freq: Every day | ORAL | 0 refills | Status: DC

## 2021-06-23 ENCOUNTER — Telehealth (HOSPITAL_COMMUNITY): Payer: Self-pay | Admitting: Psychiatry

## 2021-06-23 NOTE — Telephone Encounter (Signed)
D:  Dr. Modesta Messing referred pt to [facility] again.  Apparently, pt is receptive to calling in now.  A:  Placed call to pt to re-orient.  Walked pt through the process of when she receives the group link.  Pt will start on 06-26-21 @ 9 a.m, since there is a CCA that's already completed.  Provided pt with the case mgr's name and phone #.  Encouraged her to download the WebEx link this weekend.  Inform Dr. Modesta Messing.  R:  Pt receptive.

## 2021-06-26 ENCOUNTER — Telehealth (HOSPITAL_COMMUNITY): Payer: Self-pay | Admitting: Psychiatry

## 2021-06-27 DIAGNOSIS — R197 Diarrhea, unspecified: Secondary | ICD-10-CM | POA: Diagnosis not present

## 2021-06-28 DIAGNOSIS — R197 Diarrhea, unspecified: Secondary | ICD-10-CM | POA: Diagnosis not present
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
## 2021-07-04 ENCOUNTER — Other Ambulatory Visit: Payer: Self-pay

## 2021-07-04 ENCOUNTER — Ambulatory Visit (INDEPENDENT_AMBULATORY_CARE_PROVIDER_SITE_OTHER): Payer: PPO | Admitting: Psychiatry

## 2021-07-04 ENCOUNTER — Encounter: Payer: Self-pay | Admitting: Psychiatry

## 2021-07-04 VITALS — BP 150/79 | HR 98 | Temp 97.5°F | Wt 164.6 lb

## 2021-07-04 DIAGNOSIS — F330 Major depressive disorder, recurrent, mild: Secondary | ICD-10-CM

## 2021-07-04 DIAGNOSIS — F4310 Post-traumatic stress disorder, unspecified: Secondary | ICD-10-CM | POA: Diagnosis not present

## 2021-07-04 DIAGNOSIS — G3184 Mild cognitive impairment, so stated: Secondary | ICD-10-CM | POA: Diagnosis not present

## 2021-07-04 MED ORDER — FLUVOXAMINE MALEATE 25 MG PO TABS: 25.0000 mg | ORAL_TABLET | Freq: Every day | ORAL | 0 refills | Status: DC

## 2021-07-05 DIAGNOSIS — F3342 Major depressive disorder, recurrent, in full remission: Secondary | ICD-10-CM | POA: Diagnosis not present

## 2021-07-05 DIAGNOSIS — I10 Essential (primary) hypertension: Secondary | ICD-10-CM | POA: Diagnosis not present

## 2021-07-05 DIAGNOSIS — I471 Supraventricular tachycardia: Secondary | ICD-10-CM | POA: Diagnosis not present
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
## 2021-07-11 ENCOUNTER — Other Ambulatory Visit: Payer: Self-pay

## 2021-07-11 ENCOUNTER — Encounter: Payer: Self-pay | Admitting: Psychiatry

## 2021-07-11 ENCOUNTER — Other Ambulatory Visit
Admission: RE | Admit: 2021-07-11 | Discharge: 2021-07-11 | Disposition: A | Discharge status: Home / Self Care | Payer: PPO | Source: Ambulatory Visit | Attending: Psychiatry | Admitting: Psychiatry

## 2021-07-11 ENCOUNTER — Ambulatory Visit (INDEPENDENT_AMBULATORY_CARE_PROVIDER_SITE_OTHER): Payer: PPO | Admitting: Psychiatry

## 2021-07-11 VITALS — BP 138/78 | HR 90 | Temp 97.5°F | Wt 163.0 lb

## 2021-07-11 DIAGNOSIS — F330 Major depressive disorder, recurrent, mild: Secondary | ICD-10-CM | POA: Diagnosis not present

## 2021-07-11 DIAGNOSIS — F4310 Post-traumatic stress disorder, unspecified: Secondary | ICD-10-CM

## 2021-07-11 DIAGNOSIS — G3184 Mild cognitive impairment, so stated: Secondary | ICD-10-CM | POA: Diagnosis not present

## 2021-07-11 LAB — VITAMIN B12: Vitamin B-12: 284 pg/mL (ref 180–914)

## 2021-07-11 LAB — FOLATE: Folate: 17.6 ng/mL (ref >5.9)

## 2021-07-11 MED ORDER — FLUVOXAMINE MALEATE 100 MG PO TABS: 200.0000 mg | ORAL_TABLET | Freq: Every day | ORAL | 0 refills | Status: DC

## 2021-07-11 MED ORDER — FLUVOXAMINE MALEATE 25 MG PO TABS: 25.0000 mg | ORAL_TABLET | Freq: Every day | ORAL | 0 refills | Status: DC

## 2021-07-11 NOTE — Patient Instructions (Signed)
Continue fluvoxamine 25 mg daily Continue Fluvoxamine 200 mg a night Next appointment: 2/28 at 3:30  Obtain labs- Vitamin B12, folate

## 2021-07-13 ENCOUNTER — Other Ambulatory Visit: Payer: Self-pay

## 2021-07-13 ENCOUNTER — Ambulatory Visit: Payer: PPO | Admitting: Licensed Clinical Social Worker

## 2021-07-13 NOTE — Progress Notes (Signed)
Pt was scheduled initial New Pt appointment for establishment of counseling services. This was an in-person visit.  At the beginning of session, pt disclosed that she doesn't need counseling, she already sees a Social worker, Joanna Mendoza in [place]. Pt states that she has been seeing Joanna Mendoza for years and did not want another Social worker.   Assured pt that the appt will be cancelled and she would not be charged for the visit.

## 2021-07-14 ENCOUNTER — Telehealth: Payer: Self-pay

## 2021-07-14 NOTE — Telephone Encounter (Signed)
pt called states she needs refills on her medications

## 2021-07-14 NOTE — Telephone Encounter (Signed)
Please verify with the pharmacy. She should have enough fluvoxamine. This is the only medication I have been prescribing to her lately.

## 2021-07-15 DIAGNOSIS — J019 Acute sinusitis, unspecified: Secondary | ICD-10-CM | POA: Diagnosis not present

## 2021-07-15 DIAGNOSIS — B9689 Other specified bacterial agents as the cause of diseases classified elsewhere: Secondary | ICD-10-CM | POA: Diagnosis not present

## 2021-07-15 DIAGNOSIS — J209 Acute bronchitis, unspecified: Secondary | ICD-10-CM | POA: Diagnosis not present
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
## 2021-08-01 ENCOUNTER — Other Ambulatory Visit: Payer: Self-pay

## 2021-08-01 ENCOUNTER — Encounter: Payer: Self-pay | Admitting: Psychiatry

## 2021-08-01 ENCOUNTER — Ambulatory Visit (INDEPENDENT_AMBULATORY_CARE_PROVIDER_SITE_OTHER): Payer: PPO | Admitting: Psychiatry

## 2021-08-01 VITALS — BP 168/103 | HR 132 | Temp 97.8°F | Wt 162.0 lb

## 2021-08-01 DIAGNOSIS — F332 Major depressive disorder, recurrent severe without psychotic features: Secondary | ICD-10-CM | POA: Diagnosis not present

## 2021-08-01 DIAGNOSIS — F4310 Post-traumatic stress disorder, unspecified: Secondary | ICD-10-CM

## 2021-08-01 NOTE — Patient Instructions (Signed)
Continue fluvoxamine 25 mg daily Continue Fluvoxamine 200 mg a night Start rexulti 0.5 mg daily Next appointment: 3/21 at 2 PM

## 2021-08-04 DIAGNOSIS — F332 Major depressive disorder, recurrent severe without psychotic features: Secondary | ICD-10-CM | POA: Diagnosis not present

## 2021-08-04 DIAGNOSIS — F4312 Post-traumatic stress disorder, chronic: Secondary | ICD-10-CM | POA: Diagnosis not present

## 2021-08-04 DIAGNOSIS — F39 Unspecified mood [affective] disorder: Secondary | ICD-10-CM | POA: Diagnosis not present

## 2021-08-14 ENCOUNTER — Other Ambulatory Visit: Payer: Self-pay | Admitting: Psychiatry

## 2021-08-14 ENCOUNTER — Telehealth: Payer: Self-pay

## 2021-08-14 MED ORDER — FLUVOXAMINE MALEATE 25 MG PO TABS: 25.0000 mg | ORAL_TABLET | Freq: Every day | ORAL | 0 refills | Status: DC

## 2021-08-14 NOTE — Telephone Encounter (Signed)
Ordered

## 2021-08-14 NOTE — Telephone Encounter (Signed)
received fax requesting a refill of the fluvoxamine '25mg'$   ?

## 2021-08-17 NOTE — Progress Notes (Deleted)
[facility] MD/PA/NP OP Progress Note ? ?08/17/2021 5:36 PM ?Joanna Mendoza Age  ?MRN:  [id number] ? ?Chief Complaint: No chief complaint on file. ? ?HPI: *** ?Visit Diagnosis: No diagnosis found. ? ?Past Psychiatric History: Please see initial evaluation for full details. I have reviewed the history. No updates at this time.  ?  ? ?Past Medical History:  ?Past Medical History:  ?Diagnosis Date  ? Anxiety   ? Anxiety   ? Arthritis   ? Colon polyps   ? Depression   ? Epigastric pain   ? Fatty liver   ? Frequent PVCs   ? GERD (gastroesophageal reflux disease)   ? IBS (irritable bowel syndrome)   ? Morbid obesity ([place])   ? Nausea   ? PSVT (paroxysmal supraventricular tachycardia) ([place])   ? Restless legs   ? Status post rotator cuff repair   ?  ?Past Surgical History:  ?Procedure Laterality Date  ? ABDOMINAL HYSTERECTOMY    ? benign Left   ? breast mass removed  ? BREAST EXCISIONAL BIOPSY Left 2002  ? neg  ? BREAST SURGERY Left 2002  ? Breast Biopsy  ? CHOLECYSTECTOMY  2011  ? COLONOSCOPY WITH ESOPHAGOGASTRODUODENOSCOPY (EGD)    ? COLONOSCOPY WITH PROPOFOL N/A 08/07/2017  ? Procedure: COLONOSCOPY WITH PROPOFOL;  Surgeon: Manya Silvas, MD;  Location: [facility] ENDOSCOPY;  Service: Endoscopy;  Laterality: N/A;  ? ddd    ? ESOPHAGOGASTRODUODENOSCOPY (EGD) WITH PROPOFOL N/A 08/07/2017  ? Procedure: ESOPHAGOGASTRODUODENOSCOPY (EGD) WITH PROPOFOL;  Surgeon: Manya Silvas, MD;  Location: [facility] ENDOSCOPY;  Service: Endoscopy;  Laterality: N/A;  ? KNEE ARTHROSCOPY Left 2008  ? KNEE ARTHROSCOPY    ? LAPAROSCOPIC BILATERAL SALPINGO OOPHERECTOMY    ? OOPHORECTOMY    ? SHOULDER ACROMIOPLASTY Right 04/06/2015  ? Procedure: SUBACROMIAL DECOMPRESSION, ROTATOR CUFF REPAIR;  Surgeon: Dereck Leep, MD;  Location: [facility] ORS;  Service: Orthopedics;  Laterality: Right;  ? ? ?Family Psychiatric History: Please see initial evaluation for full details. I have reviewed the history. No updates at this time.  ?  ? ?Family History:  ?Family History  ?Problem  Relation Age of Onset  ? Depression Mother   ? Depression Sister   ? Kidney disease Sister   ? Kidney failure Sister   ? Depression Brother   ? Breast cancer Neg Hx   ? Kidney cancer Neg Hx   ? Prostate cancer Neg Hx   ? ? ?Social History:  ?Social History  ? ?Socioeconomic History  ? Marital status: Married  ?  Spouse name: Not on file  ? Number of children: Not on file  ? Years of education: Not on file  ? Highest education level: 10th grade  ?Occupational History  ? Not on file  ?Tobacco Use  ? Smoking status: Never  ? Smokeless tobacco: Never  ?Vaping Use  ? Vaping Use: Never used  ?Substance and Sexual Activity  ? Alcohol use: Yes  ?  Comment: occ.  ? Drug use: No  ? Sexual activity: Not Currently  ?Other Topics Concern  ? Not on file  ?Social History Narrative  ? Not on file  ? ?Social Determinants of Health  ? ?Financial Resource Strain: Not on file  ?Food Insecurity: Not on file  ?Transportation Needs: Not on file  ?Physical Activity: Not on file  ?Stress: Not on file  ?Social Connections: Not on file  ? ? ?Allergies:  ?Allergies  ?Allergen Reactions  ? Celexa [Citalopram] Other (See Comments)  ?  "unknown"  ?  Cogentin [Benztropine] Other (See Comments)  ?  "unknown"  ? Flagyl [Metronidazole] Other (See Comments)  ?  "unknown"  ? Hydrocodone   ? Lorazepam Other (See Comments)  ?  Worsening in irritability  ? Adhesive [Tape] Rash  ? ? ?Metabolic Disorder Labs: ?No results found for: HGBA1C, MPG ?No results found for: PROLACTIN ?Lab Results  ?Component Value Date  ? CHOL 210 (H) 01/03/2021  ? TRIG 258 (H) 01/03/2021  ? HDL 45 01/03/2021  ? CHOLHDL 4.7 01/03/2021  ? VLDL 52 (H) 01/03/2021  ? LDLCALC 113 (H) 01/03/2021  ? ?Lab Results  ?Component Value Date  ? TSH 3.312 12/19/2020  ? TSH 1.88 11/08/2012  ? ? ?Therapeutic Level Labs: ?No results found for: LITHIUM ?No results found for: VALPROATE ?No components found for:  CBMZ ? ?Current Medications: ?Current Outpatient Medications  ?Medication Sig Dispense  Refill  ? amLODipine (NORVASC) 5 MG tablet Take 5 mg by mouth daily.    ? calcium carbonate (OS-CAL) 600 MG TABS tablet Take 600 mg by mouth daily.    ? carboxymethylcellulose (REFRESH PLUS) 0.5 % SOLN Apply to eye.    ? fluvoxaMINE (LUVOX) 100 MG tablet Take 2 tablets (200 mg total) by mouth at bedtime. 60 tablet 0  ? [START ON 08/19/2021] fluvoxaMINE (LUVOX) 25 MG tablet Take 1 tablet (25 mg total) by mouth daily. 30 tablet 0  ? Magnesium Oxide -Mg Supplement 250 MG TABS Take 250 mg by mouth every morning.     ? melatonin 10 MG TABS Take 10 mg by mouth at bedtime. 30 tablet 1  ? metoprolol tartrate (LOPRESSOR) 25 MG tablet Take 0.5 tablets (12.5 mg total) by mouth 2 (two) times daily. (Patient taking differently: Take 25 mg by mouth 2 (two) times daily.) 60 tablet 1  ? omeprazole (PRILOSEC) 40 MG capsule TK 1 C PO QD    ? pyridOXINE (VITAMIN B-6) 100 MG tablet Take 100 mg by mouth daily.    ? traZODone (DESYREL) 100 MG tablet Take 1 tablet (100 mg total) by mouth at bedtime as needed for sleep. (Patient taking differently: Take 250 mg by mouth at bedtime as needed for sleep.) 30 tablet 3  ? ?No current facility-administered medications for this visit.  ? ? ? ?Musculoskeletal: ?Strength & Muscle Tone: within normal limits ?Gait & Station: normal ?Patient leans: N/A ? ?Psychiatric Specialty Exam: ?Review of Systems  ?There were no vitals taken for this visit.There is no height or weight on file to calculate BMI.  ?General Appearance: {Appearance:22683}  ?Eye Contact:  {BHH EYE CONTACT:22684}  ?Speech:  Clear and Coherent  ?Volume:  Normal  ?Mood:  {BHH MOOD:22306}  ?Affect:  {Affect (PAA):22687}  ?Thought Process:  Coherent  ?Orientation:  Full (Time, Place, and Person)  ?Thought Content: Logical   ?Suicidal Thoughts:  {ST/HT (PAA):22692}  ?Homicidal Thoughts:  {ST/HT (PAA):22692}  ?Memory:  Immediate;   Good  ?Judgement:  {Judgement (PAA):22694}  ?Insight:  {Insight (PAA):22695}  ?Psychomotor Activity:  Normal   ?Concentration:  Concentration: Good and Attention Span: Good  ?Recall:  Good  ?Fund of Knowledge: Good  ?Language: Good  ?Akathisia:  No  ?Handed:  Right  ?AIMS (if indicated): not done  ?Assets:  Communication Skills ?Desire for Improvement  ?ADL's:  Intact  ?Cognition: WNL  ?Sleep:  {BHH GOOD/FAIR/POOR:22877}  ? ?Screenings: ?AUDIT   ? ?Flowsheet Row Admission (Discharged) from 12/28/2020 in [place]  ?Alcohol Use Disorder Identification Test Final Score (AUDIT) 0  ? ?  ? ?  PHQ2-9   ? ?[place] Office Visit from 08/01/2021 in [place] Office Visit from 07/11/2021 in [place] Office Visit from 07/04/2021 in [place] Office Visit from 05/15/2021 in [place] Video Visit from 02/15/2021 in [place]  ?PHQ-2 Total Score '6 2 2 5 '$ 0  ?PHQ-9 Total Score '20 6 9 19 '$ --  ? ?  ? ?[place] Office Visit from 08/01/2021 in [place] Office Visit from 07/11/2021 in [place] [employer] Office Visit from 07/04/2021 in [place]  ?C-SSRS RISK CATEGORY Error: Q3, 4, or 5 should not be populated when Q2 is No Error: Q3, 4, or 5 should not be populated when Q2 is No Error: Q3, 4, or 5 should not be populated when Q2 is No  ? ?  ? ? ? ?Assessment and Plan:  ?Joanna Mendoza is a 76 y.o. year old female with a history of depression, alcohol use disorder,  hyperlipidemia, arthritis, fatty liver disease, who presents for follow up appointment for below.  ?  ?  ?1. Severe episode of recurrent major depressive disorder, without psychotic features ([place]) ?2. PTSD (post-traumatic stress disorder) ?There has been significant worsening in mood symptoms since the last visit in the context of conflict with her husband, and her son-in-law at home.  Will add rexulti adjunctive treatment  for depression.  Discussed potential metabolic side effect and EPS.  Will continue current dose of fluvoxamine to target PTSD and depression.  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

## 2021-08-21 DIAGNOSIS — H43813 Vitreous degeneration, bilateral: Secondary | ICD-10-CM | POA: Diagnosis not present

## 2021-08-22 ENCOUNTER — Ambulatory Visit: Payer: PPO | Admitting: Psychiatry

## 2021-08-25 DIAGNOSIS — R350 Frequency of micturition: Secondary | ICD-10-CM | POA: Diagnosis not present

## 2021-08-25 DIAGNOSIS — N390 Urinary tract infection, site not specified: Secondary | ICD-10-CM | POA: Diagnosis not present

## 2021-08-31 DIAGNOSIS — F4312 Post-traumatic stress disorder, chronic: Secondary | ICD-10-CM | POA: Diagnosis not present

## 2021-08-31 DIAGNOSIS — F39 Unspecified mood [affective] disorder: Secondary | ICD-10-CM | POA: Diagnosis not present

## 2021-08-31 DIAGNOSIS — F332 Major depressive disorder, recurrent severe without psychotic features: Secondary | ICD-10-CM | POA: Diagnosis not present

## 2021-09-05 DIAGNOSIS — K5289 Other specified noninfective gastroenteritis and colitis: Secondary | ICD-10-CM | POA: Diagnosis not present

## 2021-09-05 DIAGNOSIS — Z86010 Personal history of colonic polyps: Secondary | ICD-10-CM | POA: Diagnosis not present

## 2021-09-05 DIAGNOSIS — R7401 Elevation of levels of liver transaminase levels: Secondary | ICD-10-CM | POA: Diagnosis not present

## 2021-09-05 DIAGNOSIS — K529 Noninfective gastroenteritis and colitis, unspecified: Secondary | ICD-10-CM | POA: Diagnosis not present

## 2021-09-11 DIAGNOSIS — K529 Noninfective gastroenteritis and colitis, unspecified: Secondary | ICD-10-CM | POA: Diagnosis not present

## 2021-09-19 DIAGNOSIS — R3129 Other microscopic hematuria: Secondary | ICD-10-CM | POA: Diagnosis not present

## 2021-09-19 DIAGNOSIS — E782 Mixed hyperlipidemia: Secondary | ICD-10-CM | POA: Diagnosis not present

## 2021-09-19 DIAGNOSIS — R739 Hyperglycemia, unspecified: Secondary | ICD-10-CM | POA: Diagnosis not present

## 2021-09-26 DIAGNOSIS — I471 Supraventricular tachycardia: Secondary | ICD-10-CM | POA: Diagnosis not present

## 2021-09-26 DIAGNOSIS — R3129 Other microscopic hematuria: Secondary | ICD-10-CM | POA: Diagnosis not present

## 2021-09-26 DIAGNOSIS — Z1231 Encounter for screening mammogram for malignant neoplasm of breast: Secondary | ICD-10-CM | POA: Diagnosis not present

## 2021-09-26 DIAGNOSIS — M5136 Other intervertebral disc degeneration, lumbar region: Secondary | ICD-10-CM | POA: Diagnosis not present

## 2021-09-26 DIAGNOSIS — F3342 Major depressive disorder, recurrent, in full remission: Secondary | ICD-10-CM | POA: Diagnosis not present

## 2021-09-26 DIAGNOSIS — R1011 Right upper quadrant pain: Secondary | ICD-10-CM | POA: Diagnosis not present

## 2021-09-26 DIAGNOSIS — Z0000 Encounter for general adult medical examination without abnormal findings: Secondary | ICD-10-CM | POA: Diagnosis not present

## 2021-09-26 DIAGNOSIS — E875 Hyperkalemia: Secondary | ICD-10-CM | POA: Diagnosis not present

## 2021-09-26 DIAGNOSIS — E782 Mixed hyperlipidemia: Secondary | ICD-10-CM | POA: Diagnosis not present

## 2021-09-26 DIAGNOSIS — K760 Fatty (change of) liver, not elsewhere classified: Secondary | ICD-10-CM | POA: Diagnosis not present

## 2021-09-28 ENCOUNTER — Other Ambulatory Visit: Payer: Self-pay | Admitting: Internal Medicine

## 2021-09-28 DIAGNOSIS — Z1231 Encounter for screening mammogram for malignant neoplasm of breast: Secondary | ICD-10-CM

## 2021-10-05 DIAGNOSIS — F332 Major depressive disorder, recurrent severe without psychotic features: Secondary | ICD-10-CM | POA: Diagnosis not present

## 2021-10-05 DIAGNOSIS — F4312 Post-traumatic stress disorder, chronic: Secondary | ICD-10-CM | POA: Diagnosis not present

## 2021-10-05 DIAGNOSIS — F39 Unspecified mood [affective] disorder: Secondary | ICD-10-CM | POA: Diagnosis not present

## 2021-10-23 DIAGNOSIS — E538 Deficiency of other specified B group vitamins: Secondary | ICD-10-CM | POA: Diagnosis not present

## 2021-10-27 ENCOUNTER — Ambulatory Visit
Admission: RE | Admit: 2021-10-27 | Discharge: 2021-10-27 | Disposition: A | Discharge status: Home / Self Care | Payer: PPO | Source: Ambulatory Visit | Attending: Internal Medicine | Admitting: Internal Medicine

## 2021-10-27 DIAGNOSIS — Z1231 Encounter for screening mammogram for malignant neoplasm of breast: Secondary | ICD-10-CM | POA: Insufficient documentation

## 2021-11-22 DIAGNOSIS — R5382 Chronic fatigue, unspecified: Secondary | ICD-10-CM | POA: Diagnosis not present

## 2021-11-22 DIAGNOSIS — N390 Urinary tract infection, site not specified: Secondary | ICD-10-CM | POA: Diagnosis not present

## 2021-11-23 DIAGNOSIS — E538 Deficiency of other specified B group vitamins: Secondary | ICD-10-CM | POA: Diagnosis not present

## 2021-11-24 ENCOUNTER — Encounter: Payer: Self-pay | Admitting: *Deleted

## 2021-11-24 DIAGNOSIS — F332 Major depressive disorder, recurrent severe without psychotic features: Secondary | ICD-10-CM | POA: Diagnosis not present

## 2021-11-24 DIAGNOSIS — E538 Deficiency of other specified B group vitamins: Secondary | ICD-10-CM | POA: Diagnosis not present

## 2021-11-24 DIAGNOSIS — R7303 Prediabetes: Secondary | ICD-10-CM | POA: Diagnosis not present

## 2021-11-27 ENCOUNTER — Ambulatory Visit: Payer: PPO | Admitting: Anesthesiology

## 2021-11-27 ENCOUNTER — Ambulatory Visit
Admission: RE | Admit: 2021-11-27 | Discharge: 2021-11-27 | Disposition: A | Discharge status: Home / Self Care | Payer: PPO | Attending: Gastroenterology | Admitting: Gastroenterology

## 2021-11-27 ENCOUNTER — Encounter
Admission: RE | Disposition: A | Discharge status: Home / Self Care | Payer: Self-pay | Source: Home / Self Care | Attending: Gastroenterology

## 2021-11-27 ENCOUNTER — Encounter: Payer: Self-pay | Admitting: *Deleted

## 2021-11-27 DIAGNOSIS — Z1211 Encounter for screening for malignant neoplasm of colon: Secondary | ICD-10-CM | POA: Diagnosis not present

## 2021-11-27 DIAGNOSIS — Z6829 Body mass index (BMI) 29.0-29.9, adult: Secondary | ICD-10-CM | POA: Insufficient documentation

## 2021-11-27 DIAGNOSIS — Z79899 Other long term (current) drug therapy: Secondary | ICD-10-CM | POA: Diagnosis not present

## 2021-11-27 DIAGNOSIS — K219 Gastro-esophageal reflux disease without esophagitis: Secondary | ICD-10-CM | POA: Diagnosis not present

## 2021-11-27 DIAGNOSIS — F419 Anxiety disorder, unspecified: Secondary | ICD-10-CM | POA: Insufficient documentation

## 2021-11-27 DIAGNOSIS — D122 Benign neoplasm of ascending colon: Secondary | ICD-10-CM | POA: Insufficient documentation

## 2021-11-27 DIAGNOSIS — R197 Diarrhea, unspecified: Secondary | ICD-10-CM | POA: Diagnosis not present

## 2021-11-27 DIAGNOSIS — K760 Fatty (change of) liver, not elsewhere classified: Secondary | ICD-10-CM | POA: Diagnosis not present

## 2021-11-27 DIAGNOSIS — K580 Irritable bowel syndrome with diarrhea: Secondary | ICD-10-CM | POA: Diagnosis not present

## 2021-11-27 DIAGNOSIS — F32A Depression, unspecified: Secondary | ICD-10-CM | POA: Insufficient documentation

## 2021-11-27 DIAGNOSIS — Z86010 Personal history of colonic polyps: Secondary | ICD-10-CM | POA: Diagnosis not present

## 2021-11-27 DIAGNOSIS — K529 Noninfective gastroenteritis and colitis, unspecified: Secondary | ICD-10-CM | POA: Diagnosis present

## 2021-11-27 DIAGNOSIS — K635 Polyp of colon: Secondary | ICD-10-CM | POA: Diagnosis not present

## 2021-11-27 HISTORY — DX: Cerebral infarction, unspecified: I63.9

## 2021-11-27 HISTORY — DX: Diverticulosis of intestine, part unspecified, without perforation or abscess without bleeding: K57.90

## 2021-11-27 HISTORY — DX: Other diseases of stomach and duodenum: K31.89

## 2021-11-27 HISTORY — DX: Other intervertebral disc degeneration, lumbar region without mention of lumbar back pain or lower extremity pain: M51.369

## 2021-11-27 HISTORY — DX: Other specified disorders of bone density and structure, unspecified site: M85.80

## 2021-11-27 HISTORY — DX: Personal history of other infectious and parasitic diseases: Z86.19

## 2021-11-27 HISTORY — DX: Other intervertebral disc degeneration, lumbar region: M51.36

## 2021-11-27 HISTORY — PX: COLONOSCOPY: SHX5424

## 2021-11-27 SURGERY — COLONOSCOPY
Anesthesia: General

## 2021-11-27 MED ORDER — PROPOFOL 1000 MG/100ML IV EMUL
INTRAVENOUS | Status: AC
  Filled 2021-11-27: qty 100

## 2021-11-27 MED ORDER — PROPOFOL 500 MG/50ML IV EMUL
INTRAVENOUS | Status: DC | PRN
  Administered 2021-11-27: 150 ug/kg/min via INTRAVENOUS

## 2021-11-27 MED ORDER — SODIUM CHLORIDE 0.9 % IV SOLN
INTRAVENOUS | Status: DC
  Administered 2021-11-27: 20 mL/h via INTRAVENOUS

## 2021-11-27 NOTE — Anesthesia Postprocedure Evaluation (Signed)
Anesthesia Post Note  Patient: Joanna Mendoza  Procedure(s) Performed: COLONOSCOPY  Patient location during evaluation: Endoscopy Anesthesia Type: General Level of consciousness: awake and alert Pain management: pain level controlled Vital Signs Assessment: post-procedure vital signs reviewed and stable Respiratory status: spontaneous breathing, nonlabored ventilation, respiratory function stable and patient connected to nasal cannula oxygen Cardiovascular status: blood pressure returned to baseline and stable Postop Assessment: no apparent nausea or vomiting Anesthetic complications: no   No notable events documented.   Last Vitals:  Vitals:   11/27/21 0950 11/27/21 1001  BP:    Pulse:    Resp:  18  Temp:    SpO2: 100%     Last Pain:  Vitals:   11/27/21 0945  TempSrc:   PainSc: 0-No pain                 Cleda Mccreedy [name]

## 2021-11-28 ENCOUNTER — Encounter: Payer: Self-pay | Admitting: Gastroenterology

## 2021-11-28 LAB — SURGICAL PATHOLOGY

## 2021-12-04 DIAGNOSIS — R7401 Elevation of levels of liver transaminase levels: Secondary | ICD-10-CM | POA: Diagnosis not present

## 2021-12-08 DIAGNOSIS — I471 Supraventricular tachycardia: Secondary | ICD-10-CM | POA: Diagnosis not present

## 2021-12-08 DIAGNOSIS — E782 Mixed hyperlipidemia: Secondary | ICD-10-CM | POA: Diagnosis not present

## 2021-12-08 DIAGNOSIS — E538 Deficiency of other specified B group vitamins: Secondary | ICD-10-CM | POA: Diagnosis not present

## 2021-12-08 DIAGNOSIS — F331 Major depressive disorder, recurrent, moderate: Secondary | ICD-10-CM | POA: Diagnosis not present

## 2021-12-22 ENCOUNTER — Encounter: Payer: Self-pay | Admitting: Emergency Medicine

## 2021-12-22 ENCOUNTER — Emergency Department
Admission: EM | Admit: 2021-12-22 | Discharge: 2021-12-23 | Disposition: A | Discharge status: Home / Self Care | Payer: PPO | Attending: Emergency Medicine | Admitting: Emergency Medicine

## 2021-12-22 DIAGNOSIS — F429 Obsessive-compulsive disorder, unspecified: Secondary | ICD-10-CM | POA: Diagnosis present

## 2021-12-22 DIAGNOSIS — R45851 Suicidal ideations: Secondary | ICD-10-CM

## 2021-12-22 DIAGNOSIS — R9431 Abnormal electrocardiogram [ECG] [EKG]: Secondary | ICD-10-CM | POA: Diagnosis not present

## 2021-12-22 DIAGNOSIS — F10120 Alcohol abuse with intoxication, uncomplicated: Secondary | ICD-10-CM | POA: Insufficient documentation

## 2021-12-22 DIAGNOSIS — F1914 Other psychoactive substance abuse with psychoactive substance-induced mood disorder: Secondary | ICD-10-CM | POA: Diagnosis not present

## 2021-12-22 DIAGNOSIS — F339 Major depressive disorder, recurrent, unspecified: Secondary | ICD-10-CM

## 2021-12-22 DIAGNOSIS — F320 Major depressive disorder, single episode, mild: Secondary | ICD-10-CM

## 2021-12-22 DIAGNOSIS — F4310 Post-traumatic stress disorder, unspecified: Secondary | ICD-10-CM | POA: Diagnosis present

## 2021-12-22 DIAGNOSIS — G3184 Mild cognitive impairment, so stated: Secondary | ICD-10-CM | POA: Insufficient documentation

## 2021-12-22 DIAGNOSIS — R4189 Other symptoms and signs involving cognitive functions and awareness: Secondary | ICD-10-CM | POA: Diagnosis present

## 2021-12-22 DIAGNOSIS — F32A Depression, unspecified: Secondary | ICD-10-CM | POA: Diagnosis present

## 2021-12-22 DIAGNOSIS — F332 Major depressive disorder, recurrent severe without psychotic features: Secondary | ICD-10-CM | POA: Diagnosis not present

## 2021-12-22 DIAGNOSIS — F1994 Other psychoactive substance use, unspecified with psychoactive substance-induced mood disorder: Secondary | ICD-10-CM

## 2021-12-22 DIAGNOSIS — F10929 Alcohol use, unspecified with intoxication, unspecified: Secondary | ICD-10-CM

## 2021-12-22 LAB — ETHANOL: Alcohol, Ethyl (B): 165 mg/dL — ABNORMAL HIGH (ref <10)

## 2021-12-22 LAB — CBC
HCT: 40 % (ref 36.0–46.0)
Hemoglobin: 12.8 g/dL (ref 12.0–15.0)
MCH: 28.6 pg (ref 26.0–34.0)
MCHC: 32 g/dL (ref 30.0–36.0)
MCV: 89.3 fL (ref 80.0–100.0)
Platelets: 292 10*3/uL (ref 150–400)
RBC: 4.48 MIL/uL (ref 3.87–5.11)
RDW: 14.3 % (ref 11.5–15.5)
WBC: 8.2 10*3/uL (ref 4.0–10.5)
nRBC: 0 % (ref 0.0–0.2)

## 2021-12-22 LAB — COMPREHENSIVE METABOLIC PANEL
ALT: 36 U/L (ref 0–44)
AST: 28 U/L (ref 15–41)
Albumin: 4 g/dL (ref 3.5–5.0)
Alkaline Phosphatase: 50 U/L (ref 38–126)
Anion gap: 9 (ref 5–15)
BUN: 15 mg/dL (ref 8–23)
CO2: 25 mmol/L (ref 22–32)
Calcium: 9.1 mg/dL (ref 8.9–10.3)
Chloride: 109 mmol/L (ref 98–111)
Creatinine, Ser: 0.73 mg/dL (ref 0.44–1.00)
GFR, Estimated: >60 mL/min (ref >60)
Glucose, Bld: 142 mg/dL — ABNORMAL HIGH (ref 70–99)
Potassium: 3.7 mmol/L (ref 3.5–5.1)
Sodium: 143 mmol/L (ref 135–145)
Total Bilirubin: 0.6 mg/dL (ref 0.3–1.2)
Total Protein: 7.6 g/dL (ref 6.5–8.1)

## 2021-12-22 LAB — SALICYLATE LEVEL: Salicylate Lvl: <7 mg/dL — ABNORMAL LOW (ref 7.0–30.0)

## 2021-12-22 LAB — ACETAMINOPHEN LEVEL: Acetaminophen (Tylenol), Serum: <10 ug/mL — ABNORMAL LOW (ref 10–30)

## 2021-12-22 MED ORDER — DROPERIDOL 2.5 MG/ML IJ SOLN
5.0000 mg | Freq: Once | INTRAMUSCULAR | Status: AC
  Administered 2021-12-22: 5 mg via INTRAMUSCULAR
  Filled 2021-12-22: qty 2

## 2021-12-22 NOTE — ED Notes (Signed)
Pt up and walking unsteadily attempting to walk out shouting "I want to die, dont you get it I just want to die", this RN, charge RN austin, and mckenzie RN verbally deescalated pt attempting to get pt in bed. Pt charges at wall and attempts to take hand sanitizer off wall. Pt then moved to RM 22 for safer environment via wheelchair. Pt rolling around on floor, mattress then moved to floor as well as safety fall mats for pt safety. Pt continuing to yell "I just want to die, I'm tired, dont you get I just want to die".   EDP forbach at bedside to assess pt.

## 2021-12-22 NOTE — ED Notes (Signed)
IVC pending placement 

## 2021-12-22 NOTE — ED Notes (Signed)
Pt offered medication and took medication without any issues.

## 2021-12-22 NOTE — ED Provider Notes (Addendum)
[facility] Provider Note    Event Date/Time   First MD Initiated Contact with Patient 12/22/21 0120     (approximate)   History   Suicidal   HPI Level 5 caveat:  history/ROS limited by active psychosis / mental illness / altered mental status  Joanna Mendoza is a 76 y.o. female with a history of PTSD, recurrent major depressive disorder, OCD, and reportedly a cognitive impairment.  She presents by private vehicle stating that she does not want to live anymore.  She said that she has been drinking alcohol tonight and that she has been off of alcohol for quite a while before that.  She has labile emotions and will be calm briefly and then become very angry and then become tearful.  She will not answer any questions about medical conditions but she does not have any complaints other than not wanting to live anymore.     Physical Exam   Triage Vital Signs: ED Triage Vitals  Enc Vitals Group     BP 12/22/21 0108 (!) 126/92     Pulse Rate 12/22/21 0108 89     Resp 12/22/21 0108 20     Temp 12/22/21 0108 98.2 F (36.8 C)     Temp Source 12/22/21 0108 Oral     SpO2 12/22/21 0108 96 %     Weight 12/22/21 0106 63.5 kg (140 lb)     Height 12/22/21 0106 1.499 m ('4\' 11"'$ )     Head Circumference --      Peak Flow --      Pain Score 12/22/21 0106 0     Pain Loc --      Pain Edu? --      Excl. in [place]? --     Most recent vital signs: Vitals:   12/22/21 0108  BP: (!) 126/92  Pulse: 89  Resp: 20  Temp: 98.2 F (36.8 C)  SpO2: 96%     General: Awake, alert, ambulatory CV:  Good peripheral perfusion.  Regular rate and rhythm. Resp:  Normal effort.  Abd:  No distention.  Other:  Patient is exhibiting labile emotions, angry, then tearful, speaking with staff but refusing to speak with me.  Endorses suicidal ideation.   ED Results / Procedures / Treatments   Labs (all labs ordered are listed, but only abnormal results are displayed) Labs Reviewed   COMPREHENSIVE METABOLIC PANEL - Abnormal; Notable for the following components:      Result Value   Glucose, Bld 142 (*)    All other components within normal limits  ETHANOL - Abnormal; Notable for the following components:   Alcohol, Ethyl (B) 165 (*)    All other components within normal limits  SALICYLATE LEVEL - Abnormal; Notable for the following components:   Salicylate Lvl <2.9 (*)    All other components within normal limits  ACETAMINOPHEN LEVEL - Abnormal; Notable for the following components:   Acetaminophen (Tylenol), Serum <10 (*)    All other components within normal limits  CBC  URINE DRUG SCREEN, QUALITATIVE ([facility] ONLY)     EKG  Prior EKG demonstrates a QTc of about 480 ms, unable to obtain EKG tonight.     PROCEDURES:  Critical Care performed: Yes, see critical care procedure note(s)  .1-3 Lead EKG Interpretation  Performed by: Hinda Kehr, MD Authorized by: Hinda Kehr, MD     Interpretation: normal     ECG rate:  88   ECG rate assessment: normal  Rhythm: sinus rhythm     Ectopy: none     Conduction: normal   .Critical Care  Performed by: Hinda Kehr, MD Authorized by: Hinda Kehr, MD   Critical care provider statement:    Critical care time (minutes):  30   Critical care time was exclusive of:  Separately billable procedures and treating other patients   Critical care was necessary to treat or prevent imminent or life-threatening deterioration of the following conditions: psychiatric emergency requiring immediate intervention and calming agent administration.   Critical care was time spent personally by me on the following activities:  Development of treatment plan with patient or surrogate, evaluation of patient's response to treatment, examination of patient, obtaining history from patient or surrogate, ordering and performing treatments and interventions, ordering and review of laboratory studies, ordering and review of radiographic  studies, pulse oximetry, re-evaluation of patient's condition and review of old charts    MEDICATIONS ORDERED IN ED: Medications  droperidol (INAPSINE) 2.5 MG/ML injection 5 mg (5 mg Intramuscular Given 12/22/21 0201)     IMPRESSION / MDM / [place] / ED COURSE  I reviewed the triage vital signs and the nursing notes.                              Differential diagnosis includes, but is not limited to, depression, substance-induced mood disorder, other nonspecific adjustment disorder.  Metabolic or "organic" cause is also possible, but the patient has a history of depression and substance abuse and admits to alcohol use tonight.  Substance-induced mood disorder in the setting of chronic depression is most likely.  Patient's presentation is most consistent with acute presentation with potential threat to life or bodily function.  As per protocol, I ordered the following labs as part of the patient's medical and psychiatric evaluation:  CBC, CMP, ethanol level, acetaminophen level, salicylate level, urine drug screen.   No abnormal or concerning abnormalities on labs other than an ethanol of 165 indicating acute intoxication.  The patient had only just arrived to a bed voluntarily, still endorsing suicidal ideation, when she suddenly began to scream and started to try and run away.  She was redirected to her room where she threw self down on the floor.  There was concern that she was going to harm herself simply by throwing herself around and acting out with violent behavior.  She never tried to attack a staff member.  I was brought directly to the room.  She refused to talk to me but began screaming "fuck you all!"  and flailing around with her arms when I tried to ask her questions about how she was feeling.  I quickly reviewed the prior EKG and see that she has a slightly increased QTc interval compared to what we might prefer, but she needs an intramuscular calming agent like  droperidol for her own safety so I feel that the benefit is worth the risk.  I ordered droperidol 5 mg intramuscular.  The patient is on the cardiac monitor to evaluate for evidence of arrhythmia and/or significant heart rate changes.  I am consulting psychiatry and TTS for evaluation.  I also placed the patient under involuntary commitment given that she lacks decision-making capacity and represents a danger to herself, and is also actively claiming suicidal ideation.  The patient has been placed in psychiatric observation due to the need to provide a safe environment for the patient while obtaining psychiatric consultation  and evaluation, as well as ongoing medical and medication management to treat the patient's condition.  The patient has been placed under full IVC at this time. ----------  (Note that documentation was delayed due to multiple ED patients requiring immediate care.)  Patient was evaluated by psychiatry and TTS in person.  They feel it is appropriate to reassess her in the morning by psychiatry and she will likely be able to be discharged and follow-up as an outpatient.      FINAL CLINICAL IMPRESSION(S) / ED DIAGNOSES   Final diagnoses:  Substance induced mood disorder (HCC)  Alcoholic intoxication with complication ([place])  Episode of recurrent major depressive disorder, unspecified depression episode severity ([place])     Rx / DC Orders   ED Discharge Orders     None        Note:  This document was prepared using Dragon voice recognition software and may include unintentional dictation errors.   Hinda Kehr, MD 12/22/21 1610    Hinda Kehr, MD 12/22/21 [phone]

## 2021-12-22 NOTE — ED Triage Notes (Signed)
Pt presents via POV and states that she "feels like I don't want to live anymore". Pt endorses alcohol consumption tonight after not drinking for a long time. Pt tearful in triage but cooperative.

## 2021-12-22 NOTE — ED Notes (Signed)
IVC PENDING PLACMENT.Marland Kitchen

## 2021-12-22 NOTE — ED Notes (Signed)
Pts breakfast and drink at bedside, pt currently sleeping.

## 2021-12-22 NOTE — ED Notes (Signed)
Pt to ED for SI due to increase stressors at home, pt states her son in law is staying at her house and has caused increase stress. Pt states she also drank about 2 margaritas at home. Pt denies HI, denies AVH. States " I just wanna die, I dont know how I want to". Pt admits to previous SI with plan- stating she wants to just take a bunch of pills and just fall asleep.  Pt Is A&Ox4. Pt unsteady upon ambulation

## 2021-12-22 NOTE — ED Notes (Signed)
Pt declined nighttime snack

## 2021-12-22 NOTE — ED Notes (Signed)
Pt dressed out by this RN & EDT Arby Barrette) belongings include:  1 pair of pink slippers 1 purple/blue top & pants  2 yellow colored rings.
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
## 2021-12-22 NOTE — ED Notes (Signed)
Pt escorted by security down the hall to the restroom.

## 2021-12-22 NOTE — BH Assessment (Signed)
Comprehensive Clinical Assessment (CCA) Note  12/22/2021 Joanna Mendoza [id number]  Chief Complaint: Patient is a 76 year old female presenting to [facility] ED initially voluntary but has since been IVC'd. Per triage note Pt presents via POV and states that she "feels like I don't want to live anymore". Pt endorses alcohol consumption tonight after not drinking for a long time. Pt tearful in triage but cooperative. During assessment patient appears alert and oriented x4, calm and cooperative. Patient reports having some issues with her husband and reports that she had not drank in a while, she reports "I was saying that I wanted to die, I hadn't drank in a long time, there's some tension with me and my husband." Patient's BAL is 165. Patient currently denies SI/HI/AH/VH.  Per Psyc NP Ysidro Evert patient to be reassessed Chief Complaint  Patient presents with   Suicidal   Visit Diagnosis: Depression    CCA Screening, Triage and Referral (STR)  Patient Reported Information How did you hear about [place]? Self  Referral name: No data recorded Referral phone number: No data recorded  Whom do you see for routine medical problems? No data recorded Practice/Facility Name: No data recorded Practice/Facility Phone Number: No data recorded Name of Contact: No data recorded Contact Number: No data recorded Contact Fax Number: No data recorded Prescriber Name: No data recorded Prescriber Address (if known): No data recorded  What Is the Reason for Your Visit/Call Today? Patient presents voluntarily due to alcohol abuse and SI  How Long Has This Been Causing You Problems? > than 6 months  What Do You Feel Would Help You the Most Today? Treatment for Depression or other mood problem   Have You Recently Been in Any Inpatient Treatment (Hospital/Detox/Crisis Center/28-Day Program)? No data recorded Name/Location of Program/Hospital:No data recorded How Long Were You There? No data recorded When  Were You Discharged? No data recorded  Have You Ever Received Services From [facility] Before? No data recorded Who Do You See at [facility]? No data recorded  Have You Recently Had Any Thoughts About Hurting Yourself? Yes  Are You Planning to Commit Suicide/Harm Yourself At This time? No   Have you Recently Had Thoughts About [place]? No  Explanation: No data recorded  Have You Used Any Alcohol or Drugs in the Past 24 Hours? Yes  How Long Ago Did You Use Drugs or Alcohol? No data recorded What Did You Use and How Much? Alcohol. Unknown amounts   Do You Currently Have a Therapist/Psychiatrist? -- (Unknown)  Name of Therapist/Psychiatrist: No data recorded  Have You Been Recently Discharged From Any Office Practice or Programs? No  Explanation of Discharge From Practice/Program: No data recorded    CCA Screening Triage Referral Assessment Type of Contact: Face-to-Face  Is this Initial or Reassessment? No data recorded Date Telepsych consult ordered in [facility]:  No data recorded Time Telepsych consult ordered in [facility]:  No data recorded  Patient Reported Information Reviewed? No data recorded Patient Left Without Being Seen? No data recorded Reason for Not Completing Assessment: No data recorded  Collateral Involvement: None provided   Does Patient Have a [place]? No data recorded Name and Contact of Legal Guardian: No data recorded If Minor and Not Living with Parent(s), Who has Custody? No data recorded Is CPS involved or ever been involved? Never  Is APS involved or ever been involved? Never   Patient Determined To Be At Risk for Harm To Self or Others  Based on Review of Patient Reported Information or Presenting Complaint? No  Method: No data recorded Availability of Means: No data recorded Intent: No data recorded Notification Required: No data recorded Additional Information for Danger to Others Potential: No data  recorded Additional Comments for Danger to Others Potential: No data recorded Are There Guns or Other Weapons in Your Home? No data recorded Types of Guns/Weapons: No data recorded Are These Weapons Safely Secured?                            No data recorded Who Could Verify You Are Able To Have These Secured: No data recorded Do You Have any Outstanding Charges, Pending Court Dates, Parole/Probation? No data recorded Contacted To Inform of Risk of Harm To Self or Others: No data recorded  Location of Assessment: [facility] ED   Does Patient Present under Involuntary Commitment? Yes  IVC Papers Initial File Date: 12/22/21   [place] of Residence: [place]   Patient Currently Receiving the Following Services: Medication Management   Determination of Need: Emergent (2 hours)   Options For Referral: Inpatient Hospitalization     CCA Biopsychosocial Intake/Chief Complaint:  No data recorded Current Symptoms/Problems: No data recorded  Patient Reported Schizophrenia/Schizoaffective Diagnosis in Past: No   Strengths: Patient is able to communicate her needs  Preferences: No data recorded Abilities: No data recorded  Type of Services Patient Feels are Needed: No data recorded  Initial Clinical Notes/Concerns: No data recorded  Mental Health Symptoms Depression:   Change in energy/activity; Fatigue; Sleep (too much or little)   Duration of Depressive symptoms:  Greater than two weeks   Mania:   None   Anxiety:    Difficulty concentrating; Worrying   Psychosis:   None   Duration of Psychotic symptoms: No data recorded  Trauma:   None   Obsessions:   None   Compulsions:   None   Inattention:   None   Hyperactivity/Impulsivity:   None   Oppositional/Defiant Behaviors:   None   Emotional Irregularity:   None   Other Mood/Personality Symptoms:  No data recorded   Mental Status Exam Appearance and self-care  Stature:   Average   Weight:   Average  weight   Clothing:   Casual   Grooming:   Normal   Cosmetic use:   None   Posture/gait:   Normal   Motor activity:   Not Remarkable   Sensorium  Attention:   Normal   Concentration:   Normal   Orientation:   X5   Recall/memory:   Normal   Affect and Mood  Affect:   Appropriate   Mood:   Depressed   Relating  Eye contact:   Normal   Facial expression:   Responsive   Attitude toward examiner:   Cooperative   Thought and Language  Speech flow:  Clear and Coherent   Thought content:   Appropriate to Mood and Circumstances   Preoccupation:   None   Hallucinations:   None   Organization:  No data recorded  [employer] of Knowledge:   Fair   Intelligence:   Average   Abstraction:   Normal   Judgement:   Fair   Art therapist:   Adequate   Insight:   Fair   Decision Making:   Normal   Social Functioning  Social Maturity:   Responsible   Social Judgement:   Normal   Stress  Stressors:   Family conflict; Relationship   Coping Ability:   Normal   Skill Deficits:   None   Supports:   Family     Religion: Religion/Spirituality Are You A Religious Person?: No  Leisure/Recreation: Leisure / Recreation Do You Have Hobbies?: No  Exercise/Diet: Exercise/Diet Do You Exercise?: No Have You Gained or Lost A Significant Amount of Weight in the Past Six Months?: No Do You Follow a Special Diet?: No Do You Have Any Trouble Sleeping?: No   CCA Employment/Education Employment/Work Situation: Employment / Work Situation Employment Situation: Retired Social research officer, government has Been Impacted by Current Illness: No Has Patient ever Been in Passenger transport manager?: No  Education: Education Is Patient Currently Attending School?: No Did You Have An Individualized Education Program (IIEP): No Did You Have Any Difficulty At [employer]?: No Patient's Education Has Been Impacted by Current Illness: No   CCA Family/Childhood  History Family and Relationship History: Family history Marital status: Married Number of Years Married:  (Unknown) What types of issues is patient dealing with in the relationship?: Patient reports "there's tension with me and my husband" Additional relationship information: Unknown Does patient have children?: Yes How many children?: 3 How is patient's relationship with their children?: Patient has a good relationsip with her children  Childhood History:  Childhood History By whom was/is the patient raised?: Mother Did patient suffer any verbal/emotional/physical/sexual abuse as a child?: No Did patient suffer from severe childhood neglect?: No Has patient ever been sexually abused/assaulted/raped as an adolescent or adult?: No Was the patient ever a victim of a crime or a disaster?: No Witnessed domestic violence?: No Has patient been affected by domestic violence as an adult?: No  Child/Adolescent Assessment:     CCA Substance Use Alcohol/Drug Use: Alcohol / Drug Use Pain Medications: See MAR Prescriptions: See MAR Over the Counter: See MAR History of alcohol / drug use?: Yes Substance #1 Name of Substance 1: Alcohol, patient reports being sober prior to her presentation in the ED today 1 - Last Use / Amount: 12/21/21                       ASAM's:  Six Dimensions of Multidimensional Assessment  Dimension 1:  Acute Intoxication and/or Withdrawal Potential:      Dimension 2:  Biomedical Conditions and Complications:      Dimension 3:  Emotional, Behavioral, or Cognitive Conditions and Complications:     Dimension 4:  Readiness to Change:     Dimension 5:  Relapse, Continued use, or Continued Problem Potential:     Dimension 6:  Recovery/Living Environment:     ASAM Severity Score:    ASAM Recommended Level of Treatment:     Substance use Disorder (SUD)    Recommendations for Services/Supports/Treatments:    DSM5 Diagnoses: Patient Active Problem  List   Diagnosis Date Noted   Depression    OCD (obsessive compulsive disorder) 03/30/2021   Degenerative disc disease, lumbar 12/30/2020   Osteopenia 12/30/2020   Cognitive impairment 12/30/2020   Severe recurrent major depression without psychotic features ([place]) 12/28/2020   Suicidal ideation 12/28/2020   PTSD (post-traumatic stress disorder) 12/28/2020   Primary osteoarthritis of left knee 02/09/2019   Renal cyst 07/30/2017   Urge incontinence 07/30/2017   Urinary urgency 07/30/2017   Urinary frequency 07/30/2017   Microhematuria 07/30/2017   Hx of adenomatous colonic polyps 07/08/2017   Other insomnia 11/22/2016   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

## 2021-12-23 MED ORDER — POLYVINYL ALCOHOL 1.4 % OP SOLN: 2.0000 [drp] | Freq: Two times a day (BID) | OPHTHALMIC | Status: DC | PRN

## 2021-12-23 MED ORDER — PANTOPRAZOLE SODIUM 40 MG PO TBEC
40.0000 mg | DELAYED_RELEASE_TABLET | Freq: Every day | ORAL | Status: DC
  Administered 2021-12-23: 40 mg via ORAL
  Filled 2021-12-23: qty 1

## 2021-12-23 MED ORDER — VITAMIN D 25 MCG (1000 UNIT) PO TABS
1000.0000 [IU] | ORAL_TABLET | Freq: Every day | ORAL | Status: DC
  Administered 2021-12-23: 1000 [IU] via ORAL
  Filled 2021-12-23: qty 1

## 2021-12-23 MED ORDER — AMLODIPINE BESYLATE 5 MG PO TABS
5.0000 mg | ORAL_TABLET | Freq: Every day | ORAL | Status: DC
  Administered 2021-12-23: 5 mg via ORAL
  Filled 2021-12-23: qty 1

## 2021-12-23 MED ORDER — METOPROLOL TARTRATE 25 MG PO TABS
25.0000 mg | ORAL_TABLET | Freq: Two times a day (BID) | ORAL | Status: DC
  Administered 2021-12-23: 25 mg via ORAL
  Filled 2021-12-23: qty 1

## 2021-12-23 MED ORDER — TRAZODONE HCL 100 MG PO TABS: 100.0000 mg | ORAL_TABLET | Freq: Every evening | ORAL | Status: DC | PRN

## 2021-12-23 MED ORDER — GABAPENTIN 100 MG PO CAPS
100.0000 mg | ORAL_CAPSULE | Freq: Three times a day (TID) | ORAL | Status: DC
  Administered 2021-12-23: 100 mg via ORAL
  Filled 2021-12-23: qty 1

## 2021-12-23 MED ORDER — MAGNESIUM OXIDE -MG SUPPLEMENT 400 (240 MG) MG PO TABS: 400.0000 mg | ORAL_TABLET | Freq: Every day | ORAL | Status: DC

## 2021-12-23 MED ORDER — MELATONIN 5 MG PO TABS: 10.0000 mg | ORAL_TABLET | Freq: Every day | ORAL | Status: DC

## 2021-12-23 MED ORDER — VITAMIN B-6 50 MG PO TABS: 100.0000 mg | ORAL_TABLET | Freq: Every day | ORAL | Status: DC

## 2021-12-23 MED ORDER — FLUVOXAMINE MALEATE 50 MG PO TABS: 25.0000 mg | ORAL_TABLET | Freq: Every day | ORAL | Status: DC

## 2021-12-23 MED ORDER — CALCIUM CARBONATE 1250 (500 CA) MG PO TABS: 1250.0000 mg | ORAL_TABLET | Freq: Every day | ORAL | Status: DC

## 2021-12-23 MED ORDER — FLUVOXAMINE MALEATE 50 MG PO TABS: 200.0000 mg | ORAL_TABLET | Freq: Every day | ORAL | Status: DC

## 2021-12-23 NOTE — Discharge Instructions (Signed)
Please follow-up with your psychiatrist and your primary care doctor return for any further problems

## 2021-12-23 NOTE — ED Notes (Signed)
PT did not eat any of her breakfast tray.

## 2021-12-23 NOTE — ED Notes (Signed)
IVC/pending reassessment in the AM.

## 2021-12-23 NOTE — ED Notes (Signed)
Report received from Keyri M, RN including SBAR. On initial round after report Pt is warm/dry, resting quietly in room without any s/s of distress.  Will continue to monitor throughout shift as ordered for any changes in behaviors and for continued safety.   

## 2021-12-23 NOTE — ED Notes (Signed)
Pt given lunch tray and beverage 

## 2021-12-23 NOTE — ED Notes (Signed)
Pt is A/O x4 denies any current SI/HI stated never any A/V hallucinations.  Pt belongings accounted for and returned to to.  Pt spouse p/u in POV pt left ambulatory.  DC instructions reviewed pt denies any questions verbalized understanding.

## 2021-12-23 NOTE — Progress Notes (Signed)
Joanna Mendoza is a 76 y.o. female patient presented to [facility] ED voluntarily for treatment of her depression. The EDP placed the patient under involuntary commitment status (IVC). The patient needed to be reassessed and have her IVC rescinded. This Probation officer spoke to the patient's husband, Mr. Joanna Mendoza, [phone], who voiced no concerns about his wife's safety at home. He stated, "I am ready for her to come home. I missed my wife, and she will be just fine." This Probation officer rescinded the patient's IVC, and it was shared with Dr. Cinda Quest that the patient is psychiatrically cleared to go home.

## 2021-12-23 NOTE — ED Notes (Signed)
Pt given breakfast tray and beverage.  

## 2021-12-25 DIAGNOSIS — E538 Deficiency of other specified B group vitamins: Secondary | ICD-10-CM | POA: Diagnosis not present

## 2021-12-26 DIAGNOSIS — F39 Unspecified mood [affective] disorder: Secondary | ICD-10-CM | POA: Diagnosis not present

## 2021-12-26 DIAGNOSIS — F4312 Post-traumatic stress disorder, chronic: Secondary | ICD-10-CM | POA: Diagnosis not present

## 2021-12-26 DIAGNOSIS — F332 Major depressive disorder, recurrent severe without psychotic features: Secondary | ICD-10-CM | POA: Diagnosis not present

## 2022-01-25 DIAGNOSIS — E538 Deficiency of other specified B group vitamins: Secondary | ICD-10-CM | POA: Diagnosis not present

## 2022-01-26 DIAGNOSIS — F332 Major depressive disorder, recurrent severe without psychotic features: Secondary | ICD-10-CM | POA: Diagnosis not present

## 2022-01-26 DIAGNOSIS — F39 Unspecified mood [affective] disorder: Secondary | ICD-10-CM | POA: Diagnosis not present

## 2022-01-26 DIAGNOSIS — F4312 Post-traumatic stress disorder, chronic: Secondary | ICD-10-CM | POA: Diagnosis not present

## 2022-02-06 DIAGNOSIS — M1712 Unilateral primary osteoarthritis, left knee: Secondary | ICD-10-CM | POA: Diagnosis not present

## 2022-02-26 DIAGNOSIS — E538 Deficiency of other specified B group vitamins: Secondary | ICD-10-CM | POA: Diagnosis not present

## 2022-03-01 DIAGNOSIS — F4312 Post-traumatic stress disorder, chronic: Secondary | ICD-10-CM | POA: Diagnosis not present

## 2022-03-01 DIAGNOSIS — F332 Major depressive disorder, recurrent severe without psychotic features: Secondary | ICD-10-CM | POA: Diagnosis not present

## 2022-03-01 DIAGNOSIS — F39 Unspecified mood [affective] disorder: Secondary | ICD-10-CM | POA: Diagnosis not present

## 2022-03-21 DIAGNOSIS — E782 Mixed hyperlipidemia: Secondary | ICD-10-CM | POA: Diagnosis not present

## 2022-03-21 DIAGNOSIS — R3129 Other microscopic hematuria: Secondary | ICD-10-CM | POA: Diagnosis not present

## 2022-03-28 NOTE — Discharge Instructions (Signed)
Instructions after Total Knee Replacement   Kinzlie Harney P. Anali Cabanilla, Jr., M.D.     Dept. of Orthopaedics & Sports Medicine  [facility]  [address]  [place], [place]  [address]  Phone: [phone]   Fax: [phone]    DIET: Drink plenty of non-alcoholic fluids. Resume your normal diet. Include foods high in fiber.  ACTIVITY:  You may use crutches or a walker with weight-bearing as tolerated, unless instructed otherwise. You may be weaned off of the walker or crutches by your Physical Therapist.  Do NOT place pillows under the knee. Anything placed under the knee could limit your ability to straighten the knee.   Continue doing gentle exercises. Exercising will reduce the pain and swelling, increase motion, and prevent muscle weakness.   Please continue to use the TED compression stockings for 6 weeks. You may remove the stockings at night, but should reapply them in the morning. Do not drive or operate any equipment until instructed.  WOUND CARE:  Continue to use the PolarCare or ice packs periodically to reduce pain and swelling. You may bathe or shower after the staples are removed at the first office visit following surgery.  MEDICATIONS: You may resume your regular medications. Please take the pain medication as prescribed on the medication. Do not take pain medication on an empty stomach. You have been given a prescription for a blood thinner (Lovenox or Coumadin). Please take the medication as instructed. (NOTE: After completing a 2 week course of Lovenox, take one Enteric-coated aspirin once a day. This along with elevation will help reduce the possibility of phlebitis in your operated leg.) Do not drive or drink alcoholic beverages when taking pain medications.  CALL THE OFFICE FOR: Temperature above 101 degrees Excessive bleeding or drainage on the dressing. Excessive swelling, coldness, or paleness of the toes. Persistent nausea and vomiting.  FOLLOW-UP:  You  should have an appointment to return to the office in 10-14 days after surgery. Arrangements have been made for continuation of Physical Therapy (either home therapy or outpatient therapy).   [facility] Department Directory         [email or web address]       [email or web address]          Cardiology  Appointments: [place] - [phone] [place] - [phone]  Endocrinology  Appointments: [place] - [phone] [place] - [phone]  Gastroenterology  Appointments: [place] - [phone] [place] - [phone]        General Surgery   Appointments: [place] - [phone]  Internal Medicine/Family Medicine  Appointments: [place] - [phone] [place] - [phone] [place] - [phone]  Metabolic and Weigh Loss Surgery  Appointments: [place] - [phone]        Neurology  Appointments: [place] - [phone] [place] - [phone]  Neurosurgery  Appointments: [place] - [phone]  Obstetrics & Gynecology  Appointments: [place] - [phone] [place] - [phone]        Pediatrics  Appointments: [place] - [phone] [place] - [phone]  Physiatry  Appointments: [place] -[phone]  Physical Therapy  Appointments: [place] - [phone] [place] - [phone]        Podiatry  Appointments: [place] - [phone] [place] - [phone]  Pulmonology  Appointments: [place] - [phone]  Rheumatology  Appointments: [place] - [phone]        [place] Location: [facility]  [address] , [place]  [address]  [place] Location: [facility] [address] [place],   [address]  [place] Location: [facility] [address] [place],   [address]    

## 2022-03-29 DIAGNOSIS — Z780 Asymptomatic menopausal state: Secondary | ICD-10-CM | POA: Diagnosis not present

## 2022-03-29 DIAGNOSIS — E538 Deficiency of other specified B group vitamins: Secondary | ICD-10-CM | POA: Diagnosis not present

## 2022-03-29 DIAGNOSIS — E782 Mixed hyperlipidemia: Secondary | ICD-10-CM | POA: Diagnosis not present

## 2022-03-29 DIAGNOSIS — M5136 Other intervertebral disc degeneration, lumbar region: Secondary | ICD-10-CM | POA: Diagnosis not present

## 2022-03-29 DIAGNOSIS — Z23 Encounter for immunization: Secondary | ICD-10-CM | POA: Diagnosis not present

## 2022-03-29 DIAGNOSIS — R7303 Prediabetes: Secondary | ICD-10-CM | POA: Diagnosis not present

## 2022-03-29 DIAGNOSIS — R3129 Other microscopic hematuria: Secondary | ICD-10-CM | POA: Diagnosis not present

## 2022-03-29 DIAGNOSIS — K529 Noninfective gastroenteritis and colitis, unspecified: Secondary | ICD-10-CM | POA: Diagnosis not present

## 2022-03-29 DIAGNOSIS — F331 Major depressive disorder, recurrent, moderate: Secondary | ICD-10-CM | POA: Diagnosis not present

## 2022-04-02 ENCOUNTER — Encounter
Admission: RE | Admit: 2022-04-02 | Discharge: 2022-04-02 | Disposition: A | Discharge status: Home / Self Care | Payer: PPO | Source: Ambulatory Visit | Attending: Orthopedic Surgery | Admitting: Orthopedic Surgery

## 2022-04-02 VITALS — BP 143/59 | HR 88 | Resp 16 | Ht <= 58 in | Wt 162.9 lb

## 2022-04-02 DIAGNOSIS — M1712 Unilateral primary osteoarthritis, left knee: Secondary | ICD-10-CM | POA: Diagnosis not present

## 2022-04-02 DIAGNOSIS — R3129 Other microscopic hematuria: Secondary | ICD-10-CM

## 2022-04-02 DIAGNOSIS — R7303 Prediabetes: Secondary | ICD-10-CM | POA: Diagnosis not present

## 2022-04-02 DIAGNOSIS — I493 Ventricular premature depolarization: Secondary | ICD-10-CM | POA: Diagnosis not present

## 2022-04-02 DIAGNOSIS — Z01818 Encounter for other preprocedural examination: Secondary | ICD-10-CM | POA: Insufficient documentation

## 2022-04-02 DIAGNOSIS — Z01812 Encounter for preprocedural laboratory examination: Secondary | ICD-10-CM

## 2022-04-02 DIAGNOSIS — I471 Supraventricular tachycardia, unspecified: Secondary | ICD-10-CM

## 2022-04-02 HISTORY — DX: Suicidal ideations: R45.851

## 2022-04-02 HISTORY — DX: Prediabetes: R73.03

## 2022-04-02 HISTORY — DX: Obsessive-compulsive disorder, unspecified: F42.9

## 2022-04-02 HISTORY — DX: Essential (primary) hypertension: I10

## 2022-04-02 HISTORY — DX: Cyst of kidney, acquired: N28.1

## 2022-04-02 HISTORY — DX: Other symptoms and signs involving cognitive functions and awareness: R41.89

## 2022-04-02 LAB — URINALYSIS, ROUTINE W REFLEX MICROSCOPIC
Bilirubin Urine: NEGATIVE
Glucose, UA: NEGATIVE mg/dL
Hgb urine dipstick: NEGATIVE
Ketones, ur: NEGATIVE mg/dL
Leukocytes,Ua: NEGATIVE
Nitrite: NEGATIVE
Protein, ur: NEGATIVE mg/dL
Specific Gravity, Urine: 1.019 (ref 1.005–1.030)
pH: 5 (ref 5.0–8.0)

## 2022-04-02 LAB — TYPE AND SCREEN
ABO/RH(D): A POS
Antibody Screen: NEGATIVE

## 2022-04-02 LAB — C-REACTIVE PROTEIN: CRP: 0.9 mg/dL (ref <1.0)

## 2022-04-02 LAB — HEMOGLOBIN A1C
Hgb A1c MFr Bld: 6.2 % — ABNORMAL HIGH (ref 4.8–5.6)
Mean Plasma Glucose: 131.24 mg/dL

## 2022-04-02 LAB — SEDIMENTATION RATE: Sed Rate: 31 mm/hr — ABNORMAL HIGH (ref 0–30)

## 2022-04-02 LAB — SURGICAL PCR SCREEN
MRSA, PCR: NEGATIVE
Staphylococcus aureus: NEGATIVE

## 2022-04-02 MED ORDER — CELECOXIB 200 MG PO CAPS: 400.0000 mg | ORAL_CAPSULE | Freq: Once | ORAL | Status: DC

## 2022-04-02 NOTE — Patient Instructions (Addendum)
Your procedure is scheduled on:04-11-22 Wednesday Report to the Registration Desk on the 1st floor of the [facility].Then proceed to the 2nd floor Surgery Desk To find out your arrival time, please call [phone] between 1PM - 3PM on:04-10-22 Tuesday If your arrival time is 6:00 am, do not arrive prior to that time as the [facility] entrance doors do not open until 6:00 am.  REMEMBER: Instructions that are not followed completely may result in serious medical risk, up to and including death; or upon the discretion of your surgeon and anesthesiologist your surgery may need to be rescheduled.  Do not eat food after midnight the night before surgery.  No gum chewing, lozengers or hard candies.  You may however, drink CLEAR liquids up to 2 hours before you are scheduled to arrive for your surgery. Do not drink anything within 2 hours of your scheduled arrival time.  Clear liquids include: - water  - apple juice without pulp - gatorade (not RED colors) - black coffee or tea (Do NOT add milk or creamers to the coffee or tea) Do NOT drink anything that is not on this list.  In addition, your doctor has ordered for you to drink the provided  Ensure Pre-Surgery Clear Carbohydrate Drink Drinking this carbohydrate drink up to two hours before surgery helps to reduce insulin resistance and improve patient outcomes. Please complete drinking 2 hours prior to scheduled arrival time.  TAKE THESE MEDICATIONS THE MORNING OF SURGERY WITH A SIP OF WATER: -metoprolol tartrate (LOPRESSOR)  -omeprazole (PRILOSEC) -(take one the night before and one on the morning of surgery - helps to prevent nausea after surgery.)  One week prior to surgery: Stop Anti-inflammatories (NSAIDS) such as Advil, Aleve, Ibuprofen, Motrin, Naproxen, Naprosyn and Aspirin based products such as Excedrin, Goodys Powder, BC Powder.You may however, take Tylenol if needed for pain up until the day of surgery.  Stop ANY OVER THE  COUNTER supplements/vitamins NOW (04-02-22)-Calcium, vitamin d3, magnesium, vitamin b6-You may continue your Melatonin up until the day prior to surgery  No Alcohol for 24 hours before or after surgery.  No Smoking including e-cigarettes for 24 hours prior to surgery.  No chewable tobacco products for at least 6 hours prior to surgery.  No nicotine patches on the day of surgery.  Do not use any "recreational" drugs for at least a week prior to your surgery.  Please be advised that the combination of cocaine and anesthesia may have negative outcomes, up to and including death. If you test positive for cocaine, your surgery will be cancelled.  On the morning of surgery brush your teeth with toothpaste and water, you may rinse your mouth with mouthwash if you wish. Do not swallow any toothpaste or mouthwash.  Use CHG Soap as directed on instruction sheet.  Do not wear jewelry, make-up, hairpins, clips or nail polish.  Do not wear lotions, powders, or perfumes.   Do not shave body from the neck down 48 hours prior to surgery just in case you cut yourself which could leave a site for infection.  Also, freshly shaved skin may become irritated if using the CHG soap.  Contact lenses, hearing aids and dentures may not be worn into surgery.  Do not bring valuables to the hospital. [facility] is not responsible for any missing/lost belongings or valuables.    Notify your doctor if there is any change in your medical condition (cold, fever, infection).  Wear comfortable clothing (specific to your surgery type) to  the hospital.  After surgery, you can help prevent lung complications by doing breathing exercises.  Take deep breaths and cough every 1-2 hours. Your doctor may order a device called an Incentive Spirometer to help you take deep breaths. When coughing or sneezing, hold a pillow firmly against your incision with both hands. This is called "splinting." Doing this helps protect your  incision. It also decreases belly discomfort.  If you are being admitted to the hospital overnight, leave your suitcase in the car. After surgery it may be brought to your room.  If you are being discharged the day of surgery, you will not be allowed to drive home. You will need a responsible adult (18 years or older) to drive you home and stay with you that night.   If you are taking public transportation, you will need to have a responsible adult (18 years or older) with you. Please confirm with your physician that it is acceptable to use public transportation.   Please call the [facility] at [phone] if you have any questions about these instructions.  Surgery Visitation Policy:  Patients undergoing a surgery or procedure may have two family members or support persons with them as long as the person is not COVID-19 positive or experiencing its symptoms.   Inpatient Visitation:    Visiting hours are 7 a.m. to 8 p.m. Up to four visitors are allowed at one time in a patient room, including children. The visitors may rotate out with other people during the day. One designated support person (adult) may remain overnight.   How to Use an Incentive Spirometer An incentive spirometer is a tool that measures how well you are filling your lungs with each breath. Learning to take long, deep breaths using this tool can help you keep your lungs clear and active. This may help to reverse or lessen your chance of developing breathing (pulmonary) problems, especially infection. You may be asked to use a spirometer: After a surgery. If you have a lung problem or a history of smoking. After a long period of time when you have been unable to move or be active. If the spirometer includes an indicator to show the highest number that you have reached, your health care provider or respiratory therapist will help you set a goal. Keep a log of your progress as told by your health care  provider. What are the risks? Breathing too quickly may cause dizziness or cause you to pass out. Take your time so you do not get dizzy or light-headed. If you are in pain, you may need to take pain medicine before doing incentive spirometry. It is harder to take a deep breath if you are having pain. How to use your incentive spirometer  Sit up on the edge of your bed or on a chair. Hold the incentive spirometer so that it is in an upright position. Before you use the spirometer, breathe out normally. Place the mouthpiece in your mouth. Make sure your lips are closed tightly around it. Breathe in slowly and as deeply as you can through your mouth, causing the piston or the ball to rise toward the top of the chamber. Hold your breath for 3-5 seconds, or for as long as possible. If the spirometer includes a coach indicator, use this to guide you in breathing. Slow down your breathing if the indicator goes above the marked areas. Remove the mouthpiece from your mouth and breathe out normally. The piston or ball will return  to the bottom of the chamber. Rest for a few seconds, then repeat the steps 10 or more times. Take your time and take a few normal breaths between deep breaths so that you do not get dizzy or light-headed. Do this every 1-2 hours when you are awake. If the spirometer includes a goal marker to show the highest number you have reached (best effort), use this as a goal to work toward during each repetition. After each set of 10 deep breaths, cough a few times. This will help to make sure that your lungs are clear. If you have an incision on your chest or abdomen from surgery, place a pillow or a rolled-up towel firmly against the incision when you cough. This can help to reduce pain while taking deep breaths and coughing. General tips When you are able to get out of bed: Walk around often. Continue to take deep breaths and cough in order to clear your lungs. Keep using the  incentive spirometer until your health care provider says it is okay to stop using it. If you have been in the hospital, you may be told to keep using the spirometer at home. Contact a health care provider if: You are having difficulty using the spirometer. You have trouble using the spirometer as often as instructed. Your pain medicine is not giving enough relief for you to use the spirometer as told. You have a fever. Get help right away if: You develop shortness of breath. You develop a cough with bloody mucus from the lungs. You have fluid or blood coming from an incision site after you cough. Summary An incentive spirometer is a tool that can help you learn to take long, deep breaths to keep your lungs clear and active. You may be asked to use a spirometer after a surgery, if you have a lung problem or a history of smoking, or if you have been inactive for a long period of time. Use your incentive spirometer as instructed every 1-2 hours while you are awake. If you have an incision on your chest or abdomen, place a pillow or a rolled-up towel firmly against your incision when you cough. This will help to reduce pain. Get help right away if you have shortness of breath, you cough up bloody mucus, or blood comes from your incision when you cough. This information is not intended to replace advice given to you by your health care provider. Make sure you discuss any questions you have with your health care provider. Document Revised: 08/10/2019 Document Reviewed: 08/10/2019 Elsevier Patient Education  [place].

## 2022-04-03 DIAGNOSIS — M1712 Unilateral primary osteoarthritis, left knee: Secondary | ICD-10-CM | POA: Diagnosis not present

## 2022-04-05 DIAGNOSIS — F332 Major depressive disorder, recurrent severe without psychotic features: Secondary | ICD-10-CM | POA: Diagnosis not present

## 2022-04-05 DIAGNOSIS — F39 Unspecified mood [affective] disorder: Secondary | ICD-10-CM | POA: Diagnosis not present

## 2022-04-05 DIAGNOSIS — F4312 Post-traumatic stress disorder, chronic: Secondary | ICD-10-CM | POA: Diagnosis not present

## 2022-04-06 DIAGNOSIS — M8588 Other specified disorders of bone density and structure, other site: Secondary | ICD-10-CM | POA: Diagnosis not present

## 2022-04-08 NOTE — H&P (Signed)
ORTHOPAEDIC HISTORY & PHYSICAL Joanna Mendoza, [place] - 04/03/2022 11:00 AM EDT Formatting of this note is different from the original. [place] Chief Complaint:  Chief Complaint Patient presents with Knee Pain H & P LEFT KNEE  History of Present Illness:  Joanna Mendoza is a 76 y.o. female that presents to clinic today for her preoperative history and evaluation. Patient presents unaccompanied. The patient is scheduled to undergo a left total knee arthroplasty on 04/11/22 by Dr. Marry Mendoza. Her pain began many years ago. The pain is located primarily along the medial aspect of the knee. She describes her pain as worse with weightbearing. She reports associated swelling with some giving way of the knee. She denies associated numbness or tingling, denies locking.  The patient's symptoms have progressed to the point that they decrease her quality of life. The patient has previously undergone conservative treatment including NSAIDS and injections to the knee without adequate control of her symptoms.  Denies history of lumbar surgery, significant cardiac history, or history of DVT.  Patient will have her husband at home to help post-operatively.  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  Past Surgical History: Past Surgical History: Procedure Laterality Date Benign left breast mass removed 2002 COLONOSCOPY 08/30/2003 Adenomatous Polyp EGD 08/30/2003 Duodenal Tubulovillous Adenoma: Referral to [facility] EGD 02/11/2004 Adenomatous Duodenal Polyp Foot surgery Left 2006 EGD 08/01/2004 Adenomatous Duodenal Polyp EGD 08/28/2004 Duodenal Adenomatous Mass EGD 02/06/2005 [facility] EGD 08/27/2005 [facility] KNEE ARTHROSCOPY Left 11/07/2005 Partial medial and lateral meniscectomy EGD 08/27/2006 Duodenal Polyp COLONOSCOPY 02/10/2007 [place] Adenomatous Polyp CHOLECYSTECTOMY 2011 EGD 11/04/2009 Duodenal Polyp EGD 01/15/2012 F/U Duodenal Polyp ([facility]): CBF 01/2015 COLONOSCOPY 09/26/2012 [facility] Adenomatous Polyp, [facility] (Father): CBF 09/2017; OV made 09/12/2017 @ 1:30pm w/Kim Jerelene Redden NP (dh) Right rotator cuff repair 04/06/2015 EGD 08/07/2017 F/U Duodenal Polyp: C[facility] 08/2022 COLONOSCOPY 08/07/2017 Adenomatous Polyps: CBF 08/2022 Colon @ [facility] 07/30/2021 Tubular adenoma/PHx CP/Repeat 90yr/CTL HYSTERECTOMY age 76 noncancerous OOPHORECTOMY Bilateral and tubes POLYPECTOMY Hyperplastic Gastric Polyp, Adenomatous Duodenal Polyp  Current Medications: Current Outpatient Medications Medication Sig Dispense Refill CALCIUM CARBONATE (CALCIUM 600 ORAL) Take 1 tablet by mouth once daily carboxymethylcellulose (REFRESH TEARS) 0.5 % ophthalmic solution Place 1-2 drops into both eyes once daily cholecalciferol (VITAMIN D3) 1000 unit tablet Take 1,000 Units by mouth once daily diclofenac (VOLTAREN) 1 % topical gel Apply 2 g topically 4 (four) times daily as needed estradioL (ESTRACE) 0.01 % (0.1 mg/gram) vaginal cream Insert fingertip unit amount twice weekly for maintenance 42.5 g 0 fluvoxaMINE (LUVOX) 100 MG tablet Take 1.5 tablets (150 mg total) by mouth 2 (two) times daily 270 tablet  3 melatonin 10 mg Tab Take 10 mg by mouth at bedtime metoprolol tartrate (LOPRESSOR) 25 MG tablet Take 1 tablet (25 mg total) by mouth 2 (two) times daily 60 tablet 5 omeprazole (PRILOSEC) 40 MG DR capsule Take 1 capsule (40 mg total) by mouth once daily 90 capsule 3 traZODone (DESYREL)  100 MG tablet Take 2 tablets (200 mg total) by mouth at bedtime 180 tablet 3  Current Facility-Administered Medications Medication Dose Route Frequency Provider Last Rate Last Admin cyanocobalamin (VITAMIN B12) injection 1,000 mcg 1,000 mcg Intramuscular Q30 Days Sheliah Hatch III, MD 1,000 mcg at 02/26/22 1004  Allergies: Allergies Allergen Reactions Adhesive Tape-Silicones Other (See Comments) Redness Cogentin [Benztropine] Other (See Comments) Pt does not remember reaction. Flagyl [Metronidazole Hcl] Other (See Comments) Pt does not remember reaction. Hydrocodone Anxiety Lorazepam Other (See Comments) Worsening in irritability  Social History: Social History  Socioeconomic History Marital status: Married Spouse name: Nadara Mustard Number of children: 3 Years of education: 10 Occupational History Occupation: Retired Tobacco Use Smoking status: Never Smokeless tobacco: Never Scientist, research (physical sciences) Use: Never used Substance and Sexual Activity Alcohol use: No Drug use: No Sexual activity: Not Currently Partners: Male Birth control/protection: Surgical  Family History: Family History Problem Relation Age of Onset Colon cancer Father in his 73s Depression Father Leukemia Sister Depression Sister Heart disease Mother Myocardial Infarction (Heart attack) Mother High blood pressure (Hypertension) Mother Depression Brother Diabetes type I Sister Depression Sister Depression Brother Depression Brother Depression Brother Depression Sister Depression Son No Known Problems Son No Known Problems Daughter  Review of Systems:  A 10+ ROS was performed, reviewed, and the pertinent  orthopaedic findings are documented in the HPI.  Physical Examination:  BP 130/80 (BP Location: Left upper arm, Patient Position: Sitting, BP Cuff Size: Adult)  Ht 147.3 cm ('4\' 10"'$ )  Wt 72.9 kg (160 lb 12.8 oz)  BMI 33.61 kg/m  Patient is a well-developed, well-nourished female in no acute distress. Patient has normal mood and affect. Patient is alert and oriented to person, place, and time.  HEENT: Atraumatic, normocephalic. Pupils equal and reactive to light. Extraocular motion intact. Noninjected sclera.  Cardiovascular: Regular rate and rhythm, with no murmurs, rubs, or gallops. Distal pulses palpable. No carotid bruits.  Respiratory: Lungs clear to auscultation bilaterally.  Left Knee: Soft tissue swelling: mild Effusion: none Erythema: none Crepitance: minimal Tenderness: Medial joint line Alignment: relative varus Mediolateral laxity: medial pseudolaxity Posterior sag: negative Patellar tracking: Good tracking without evidence of subluxation or tilt Atrophy: No significant atrophy. Quadriceps tone was fair to good. Range of motion: 0/0/120 degrees  Able to plantarflex and dorsiflex the left ankle. Able to flex and extend the toes.  Sensation intact over the saphenous, lateral sural cutaneous, superficial fibular, and deep fibular nerve distributions.  Tests Performed/Reviewed: X-rays  No new x-rays were obtained. Previous x-rays show severe loss of medial compartment joint space with osteophyte formation.  Impression:  ICD-10-CM 1. Primary osteoarthritis of left knee M17.12  Plan:  The patient has end-stage degenerative changes of the left knee. It was explained to the patient that the condition is progressive in nature. Having failed conservative treatment, the patient has elected to proceed with a total joint arthroplasty. The patient will undergo a total joint arthroplasty with Dr. Marry Mendoza. The risks of surgery, including blood clot and infection, were  discussed with the patient. Measures to reduce these risks, including the use of anticoagulation, perioperative antibiotics, and early ambulation were discussed. The importance of postoperative physical therapy was discussed with the patient. The patient elects to proceed with surgery. The patient is instructed to stop all blood thinners prior to surgery. The patient is instructed to call the hospital the day before surgery to learn of the proper arrival time.  Contact our office with any questions or concerns. Follow up as indicated,  or sooner should any new problems arise, if conditions worsen, or if they are otherwise concerned.  Joanna Mendoza, [facility] [place], [place] [address] Phone: [phone]  This note was generated in part with voice recognition software and I apologize for any typographical errors that were not detected and corrected.  Electronically signed by Joanna Fudge, PA at 04/03/2022 12:58 PM EDT

## 2022-04-10 MED ORDER — LACTATED RINGERS IV SOLN: INTRAVENOUS | Status: DC

## 2022-04-10 MED ORDER — GABAPENTIN 300 MG PO CAPS: 300.0000 mg | ORAL_CAPSULE | Freq: Once | ORAL | Status: AC

## 2022-04-10 MED ORDER — ORAL CARE MOUTH RINSE: 15.0000 mL | Freq: Once | OROMUCOSAL | Status: AC

## 2022-04-10 MED ORDER — DEXAMETHASONE SODIUM PHOSPHATE 10 MG/ML IJ SOLN: 8.0000 mg | Freq: Once | INTRAMUSCULAR | Status: AC

## 2022-04-10 MED ORDER — CHLORHEXIDINE GLUCONATE 0.12 % MT SOLN: 15.0000 mL | Freq: Once | OROMUCOSAL | Status: AC

## 2022-04-10 MED ORDER — TRANEXAMIC ACID-NACL 1000-0.7 MG/100ML-% IV SOLN
1000.0000 mg | INTRAVENOUS | Status: AC
  Administered 2022-04-11: 1000 mg via INTRAVENOUS

## 2022-04-10 MED ORDER — CHLORHEXIDINE GLUCONATE 4 % EX LIQD
60.0000 mL | Freq: Once | CUTANEOUS | Status: AC
  Administered 2022-04-11: 4 via TOPICAL

## 2022-04-10 MED ORDER — CEFAZOLIN SODIUM-DEXTROSE 2-4 GM/100ML-% IV SOLN
2.0000 g | INTRAVENOUS | Status: AC
  Administered 2022-04-11: 2 g via INTRAVENOUS

## 2022-04-11 ENCOUNTER — Encounter: Payer: Self-pay | Admitting: Orthopedic Surgery

## 2022-04-11 ENCOUNTER — Ambulatory Visit: Payer: PPO | Admitting: Urgent Care

## 2022-04-11 ENCOUNTER — Observation Stay
Admission: RE | Admit: 2022-04-11 | Discharge: 2022-04-12 | Disposition: A | Discharge status: Home / Self Care | Payer: PPO | Attending: Orthopedic Surgery | Admitting: Orthopedic Surgery

## 2022-04-11 ENCOUNTER — Other Ambulatory Visit: Payer: Self-pay

## 2022-04-11 ENCOUNTER — Encounter
Admission: RE | Disposition: A | Discharge status: Home / Self Care | Payer: Self-pay | Source: Home / Self Care | Attending: Orthopedic Surgery

## 2022-04-11 ENCOUNTER — Observation Stay: Payer: PPO

## 2022-04-11 DIAGNOSIS — I10 Essential (primary) hypertension: Secondary | ICD-10-CM | POA: Diagnosis not present

## 2022-04-11 DIAGNOSIS — Z96652 Presence of left artificial knee joint: Secondary | ICD-10-CM | POA: Diagnosis not present

## 2022-04-11 DIAGNOSIS — I493 Ventricular premature depolarization: Secondary | ICD-10-CM

## 2022-04-11 DIAGNOSIS — R3129 Other microscopic hematuria: Secondary | ICD-10-CM

## 2022-04-11 DIAGNOSIS — Z471 Aftercare following joint replacement surgery: Secondary | ICD-10-CM | POA: Diagnosis not present

## 2022-04-11 DIAGNOSIS — Z8673 Personal history of transient ischemic attack (TIA), and cerebral infarction without residual deficits: Secondary | ICD-10-CM | POA: Diagnosis not present

## 2022-04-11 DIAGNOSIS — M1712 Unilateral primary osteoarthritis, left knee: Secondary | ICD-10-CM | POA: Diagnosis not present

## 2022-04-11 DIAGNOSIS — I471 Supraventricular tachycardia, unspecified: Secondary | ICD-10-CM

## 2022-04-11 DIAGNOSIS — Z79899 Other long term (current) drug therapy: Secondary | ICD-10-CM | POA: Insufficient documentation

## 2022-04-11 DIAGNOSIS — E782 Mixed hyperlipidemia: Secondary | ICD-10-CM

## 2022-04-11 HISTORY — PX: KNEE ARTHROPLASTY: SHX992

## 2022-04-11 LAB — GLUCOSE, CAPILLARY
Glucose-Capillary: 201 mg/dL — ABNORMAL HIGH (ref 70–99)
Glucose-Capillary: 63 mg/dL — ABNORMAL LOW (ref 70–99)
Glucose-Capillary: 129 mg/dL — ABNORMAL HIGH (ref 70–99)

## 2022-04-11 LAB — ABO/RH: ABO/RH(D): A POS

## 2022-04-11 SURGERY — ARTHROPLASTY, KNEE, TOTAL, USING IMAGELESS COMPUTER-ASSISTED NAVIGATION
Anesthesia: Spinal | Site: Knee | Laterality: Left

## 2022-04-11 MED ORDER — SODIUM CHLORIDE FLUSH 0.9 % IV SOLN
INTRAVENOUS | Status: AC
  Filled 2022-04-11: qty 80

## 2022-04-11 MED ORDER — MIDAZOLAM HCL 2 MG/2ML IJ SOLN
INTRAMUSCULAR | Status: AC
  Filled 2022-04-11: qty 2

## 2022-04-11 MED ORDER — CELECOXIB 200 MG PO CAPS: 200.0000 mg | ORAL_CAPSULE | Freq: Two times a day (BID) | ORAL | Status: DC

## 2022-04-11 MED ORDER — VITAMIN D 25 MCG (1000 UNIT) PO TABS
1000.0000 [IU] | ORAL_TABLET | Freq: Every day | ORAL | Status: DC
  Administered 2022-04-11 – 2022-04-12 (×2): 1000 [IU] via ORAL
  Filled 2022-04-11 (×2): qty 1

## 2022-04-11 MED ORDER — SODIUM CHLORIDE 0.9 % IR SOLN
Status: DC | PRN
  Administered 2022-04-11: 3000 mL

## 2022-04-11 MED ORDER — PHENYLEPHRINE HCL-NACL 20-0.9 MG/250ML-% IV SOLN
INTRAVENOUS | Status: AC
  Filled 2022-04-11: qty 250

## 2022-04-11 MED ORDER — BUPIVACAINE HCL (PF) 0.5 % IJ SOLN
INTRAMUSCULAR | Status: DC | PRN
  Administered 2022-04-11: 2.4 mL

## 2022-04-11 MED ORDER — MAGNESIUM HYDROXIDE 400 MG/5ML PO SUSP
30.0000 mL | Freq: Every day | ORAL | Status: DC
  Administered 2022-04-11 – 2022-04-12 (×2): 30 mL via ORAL
  Filled 2022-04-11 (×2): qty 30

## 2022-04-11 MED ORDER — PHENYLEPHRINE HCL (PRESSORS) 10 MG/ML IV SOLN
INTRAVENOUS | Status: AC
  Filled 2022-04-11: qty 1

## 2022-04-11 MED ORDER — CHLORHEXIDINE GLUCONATE 0.12 % MT SOLN
OROMUCOSAL | Status: AC
  Administered 2022-04-11: 15 mL via OROMUCOSAL
  Filled 2022-04-11: qty 15

## 2022-04-11 MED ORDER — PHENOL 1.4 % MT LIQD: 1.0000 | OROMUCOSAL | Status: DC | PRN

## 2022-04-11 MED ORDER — ALUM & MAG HYDROXIDE-SIMETH 200-200-20 MG/5ML PO SUSP: 30.0000 mL | ORAL | Status: DC | PRN

## 2022-04-11 MED ORDER — ESTRADIOL 0.1 MG/GM VA CREA
1.0000 | TOPICAL_CREAM | VAGINAL | Status: DC
  Filled 2022-04-11: qty 42.5

## 2022-04-11 MED ORDER — METOPROLOL TARTRATE 25 MG PO TABS
25.0000 mg | ORAL_TABLET | Freq: Two times a day (BID) | ORAL | Status: DC
  Administered 2022-04-11 – 2022-04-12 (×3): 25 mg via ORAL
  Filled 2022-04-11 (×3): qty 1

## 2022-04-11 MED ORDER — FENTANYL CITRATE (PF) 100 MCG/2ML IJ SOLN
INTRAMUSCULAR | Status: DC | PRN
  Administered 2022-04-11: 25 ug via INTRAVENOUS

## 2022-04-11 MED ORDER — TRAMADOL HCL 50 MG PO TABS: 50.0000 mg | ORAL_TABLET | ORAL | Status: DC | PRN

## 2022-04-11 MED ORDER — PHENYLEPHRINE HCL-NACL 20-0.9 MG/250ML-% IV SOLN
INTRAVENOUS | Status: DC | PRN
  Administered 2022-04-11: 30 ug/min via INTRAVENOUS

## 2022-04-11 MED ORDER — POLYVINYL ALCOHOL 1.4 % OP SOLN: 2.0000 [drp] | Freq: Every day | OPHTHALMIC | Status: DC | PRN

## 2022-04-11 MED ORDER — ACETAMINOPHEN 10 MG/ML IV SOLN
INTRAVENOUS | Status: AC
  Filled 2022-04-11: qty 200

## 2022-04-11 MED ORDER — PHENYLEPHRINE 80 MCG/ML (10ML) SYRINGE FOR IV PUSH (FOR BLOOD PRESSURE SUPPORT)
PREFILLED_SYRINGE | INTRAVENOUS | Status: AC
  Filled 2022-04-11: qty 10

## 2022-04-11 MED ORDER — HYDROMORPHONE HCL 1 MG/ML IJ SOLN
0.5000 mg | INTRAMUSCULAR | Status: DC | PRN
  Administered 2022-04-11 – 2022-04-12 (×2): 1 mg via INTRAVENOUS
  Filled 2022-04-11 (×2): qty 1

## 2022-04-11 MED ORDER — SURGIPHOR WOUND IRRIGATION SYSTEM - OPTIME: TOPICAL | Status: DC | PRN

## 2022-04-11 MED ORDER — CELECOXIB 200 MG PO CAPS
200.0000 mg | ORAL_CAPSULE | Freq: Two times a day (BID) | ORAL | Status: DC
  Administered 2022-04-11 – 2022-04-12 (×2): 200 mg via ORAL
  Filled 2022-04-11 (×2): qty 1

## 2022-04-11 MED ORDER — ACETAMINOPHEN 10 MG/ML IV SOLN
INTRAVENOUS | Status: DC | PRN
  Administered 2022-04-11: 1000 mg via INTRAVENOUS

## 2022-04-11 MED ORDER — ONDANSETRON HCL 4 MG PO TABS: 4.0000 mg | ORAL_TABLET | Freq: Four times a day (QID) | ORAL | Status: DC | PRN

## 2022-04-11 MED ORDER — OXYCODONE HCL 5 MG PO TABS
10.0000 mg | ORAL_TABLET | ORAL | Status: DC | PRN
  Administered 2022-04-11 (×2): 10 mg via ORAL
  Filled 2022-04-11 (×3): qty 2

## 2022-04-11 MED ORDER — MAGNESIUM OXIDE -MG SUPPLEMENT 400 (240 MG) MG PO TABS
200.0000 mg | ORAL_TABLET | Freq: Every day | ORAL | Status: DC
  Administered 2022-04-12: 200 mg via ORAL
  Filled 2022-04-11: qty 1

## 2022-04-11 MED ORDER — FENTANYL CITRATE (PF) 100 MCG/2ML IJ SOLN
INTRAMUSCULAR | Status: AC
  Filled 2022-04-11: qty 2

## 2022-04-11 MED ORDER — OXYCODONE HCL 5 MG PO TABS
5.0000 mg | ORAL_TABLET | ORAL | Status: DC | PRN
  Administered 2022-04-11: 5 mg via ORAL
  Filled 2022-04-11: qty 1

## 2022-04-11 MED ORDER — PROPOFOL 1000 MG/100ML IV EMUL
INTRAVENOUS | Status: AC
  Filled 2022-04-11: qty 100

## 2022-04-11 MED ORDER — CELECOXIB 200 MG PO CAPS
ORAL_CAPSULE | ORAL | Status: AC
  Administered 2022-04-11: 400 mg via ORAL
  Filled 2022-04-11: qty 2

## 2022-04-11 MED ORDER — CELECOXIB 200 MG PO CAPS: 400.0000 mg | ORAL_CAPSULE | Freq: Once | ORAL | Status: AC

## 2022-04-11 MED ORDER — CEFAZOLIN SODIUM-DEXTROSE 2-4 GM/100ML-% IV SOLN
INTRAVENOUS | Status: AC
  Administered 2022-04-11: 2 g via INTRAVENOUS
  Filled 2022-04-11: qty 100

## 2022-04-11 MED ORDER — FLEET ENEMA 7-19 GM/118ML RE ENEM: 1.0000 | ENEMA | Freq: Once | RECTAL | Status: DC | PRN

## 2022-04-11 MED ORDER — SODIUM CHLORIDE 0.9 % IV SOLN: INTRAVENOUS | Status: DC

## 2022-04-11 MED ORDER — TRANEXAMIC ACID-NACL 1000-0.7 MG/100ML-% IV SOLN
1000.0000 mg | Freq: Once | INTRAVENOUS | Status: AC
  Administered 2022-04-11: 1000 mg via INTRAVENOUS

## 2022-04-11 MED ORDER — MIDAZOLAM HCL 5 MG/5ML IJ SOLN
INTRAMUSCULAR | Status: DC | PRN
  Administered 2022-04-11: 1 mg via INTRAVENOUS

## 2022-04-11 MED ORDER — PROPOFOL 500 MG/50ML IV EMUL
INTRAVENOUS | Status: DC | PRN
  Administered 2022-04-11: 70 ug/kg/min via INTRAVENOUS

## 2022-04-11 MED ORDER — FENTANYL CITRATE (PF) 100 MCG/2ML IJ SOLN: 25.0000 ug | INTRAMUSCULAR | Status: DC | PRN

## 2022-04-11 MED ORDER — PROPOFOL 10 MG/ML IV BOLUS
INTRAVENOUS | Status: DC | PRN
  Administered 2022-04-11: 40 mg via INTRAVENOUS

## 2022-04-11 MED ORDER — ENSURE PRE-SURGERY PO LIQD
296.0000 mL | Freq: Once | ORAL | Status: AC
  Administered 2022-04-11: 296 mL via ORAL

## 2022-04-11 MED ORDER — ENOXAPARIN SODIUM 30 MG/0.3ML IJ SOSY
30.0000 mg | PREFILLED_SYRINGE | Freq: Two times a day (BID) | INTRAMUSCULAR | Status: DC
  Administered 2022-04-12: 30 mg via SUBCUTANEOUS
  Filled 2022-04-11: qty 0.3

## 2022-04-11 MED ORDER — TRANEXAMIC ACID-NACL 1000-0.7 MG/100ML-% IV SOLN
INTRAVENOUS | Status: AC
  Filled 2022-04-11: qty 100

## 2022-04-11 MED ORDER — OXYCODONE HCL 5 MG PO TABS: 5.0000 mg | ORAL_TABLET | Freq: Once | ORAL | Status: DC | PRN

## 2022-04-11 MED ORDER — OXYCODONE HCL 5 MG/5ML PO SOLN: 5.0000 mg | Freq: Once | ORAL | Status: DC | PRN

## 2022-04-11 MED ORDER — ACETAMINOPHEN 10 MG/ML IV SOLN
1000.0000 mg | Freq: Four times a day (QID) | INTRAVENOUS | Status: AC
  Administered 2022-04-11 – 2022-04-12 (×4): 1000 mg via INTRAVENOUS
  Filled 2022-04-11 (×4): qty 100

## 2022-04-11 MED ORDER — DEXAMETHASONE SODIUM PHOSPHATE 10 MG/ML IJ SOLN
INTRAMUSCULAR | Status: AC
  Administered 2022-04-11: 8 mg via INTRAVENOUS
  Filled 2022-04-11: qty 1

## 2022-04-11 MED ORDER — BISACODYL 10 MG RE SUPP: 10.0000 mg | Freq: Every day | RECTAL | Status: DC | PRN

## 2022-04-11 MED ORDER — DEXTROSE 50 % IV SOLN
INTRAVENOUS | Status: AC
  Administered 2022-04-11: 50 mL via INTRAVENOUS
  Filled 2022-04-11: qty 50

## 2022-04-11 MED ORDER — PROPOFOL 10 MG/ML IV BOLUS
INTRAVENOUS | Status: AC
  Filled 2022-04-11: qty 20

## 2022-04-11 MED ORDER — CALCIUM CARBONATE 1250 (500 CA) MG PO TABS
1250.0000 mg | ORAL_TABLET | Freq: Every day | ORAL | Status: DC
  Administered 2022-04-11 – 2022-04-12 (×2): 1250 mg via ORAL
  Filled 2022-04-11 (×2): qty 1

## 2022-04-11 MED ORDER — ACETAMINOPHEN 325 MG PO TABS: 325.0000 mg | ORAL_TABLET | Freq: Four times a day (QID) | ORAL | Status: DC | PRN

## 2022-04-11 MED ORDER — CEFAZOLIN SODIUM-DEXTROSE 2-4 GM/100ML-% IV SOLN
2.0000 g | Freq: Four times a day (QID) | INTRAVENOUS | Status: AC
  Administered 2022-04-11: 2 g via INTRAVENOUS
  Filled 2022-04-11 (×2): qty 100

## 2022-04-11 MED ORDER — BUPIVACAINE LIPOSOME 1.3 % IJ SUSP
INTRAMUSCULAR | Status: AC
  Filled 2022-04-11: qty 40

## 2022-04-11 MED ORDER — MENTHOL 3 MG MT LOZG: 1.0000 | LOZENGE | OROMUCOSAL | Status: DC | PRN

## 2022-04-11 MED ORDER — BUPIVACAINE HCL (PF) 0.5 % IJ SOLN
INTRAMUSCULAR | Status: AC
  Filled 2022-04-11: qty 20

## 2022-04-11 MED ORDER — FERROUS SULFATE 325 (65 FE) MG PO TABS
325.0000 mg | ORAL_TABLET | Freq: Two times a day (BID) | ORAL | Status: DC
  Administered 2022-04-11 – 2022-04-12 (×3): 325 mg via ORAL
  Filled 2022-04-11 (×3): qty 1

## 2022-04-11 MED ORDER — ONDANSETRON HCL 4 MG/2ML IJ SOLN
4.0000 mg | Freq: Four times a day (QID) | INTRAMUSCULAR | Status: DC | PRN
  Administered 2022-04-11: 4 mg via INTRAVENOUS
  Filled 2022-04-11 (×2): qty 2

## 2022-04-11 MED ORDER — SENNOSIDES-DOCUSATE SODIUM 8.6-50 MG PO TABS
1.0000 | ORAL_TABLET | Freq: Two times a day (BID) | ORAL | Status: DC
  Administered 2022-04-11 – 2022-04-12 (×2): 1 via ORAL
  Filled 2022-04-11 (×3): qty 1

## 2022-04-11 MED ORDER — BUPIVACAINE HCL (PF) 0.25 % IJ SOLN
INTRAMUSCULAR | Status: AC
  Filled 2022-04-11: qty 120

## 2022-04-11 MED ORDER — ONDANSETRON HCL 4 MG/2ML IJ SOLN
INTRAMUSCULAR | Status: AC
  Filled 2022-04-11: qty 2

## 2022-04-11 MED ORDER — SODIUM CHLORIDE (PF) 0.9 % IJ SOLN
INTRAMUSCULAR | Status: DC | PRN
  Administered 2022-04-11: 120 mL via INTRAMUSCULAR

## 2022-04-11 MED ORDER — METOCLOPRAMIDE HCL 5 MG PO TABS
10.0000 mg | ORAL_TABLET | Freq: Three times a day (TID) | ORAL | Status: DC
  Administered 2022-04-11 – 2022-04-12 (×5): 10 mg via ORAL
  Filled 2022-04-11 (×5): qty 2

## 2022-04-11 MED ORDER — TRAZODONE HCL 100 MG PO TABS
200.0000 mg | ORAL_TABLET | Freq: Every day | ORAL | Status: DC
  Administered 2022-04-11: 200 mg via ORAL
  Filled 2022-04-11: qty 2

## 2022-04-11 MED ORDER — DIPHENHYDRAMINE HCL 12.5 MG/5ML PO ELIX: 12.5000 mg | ORAL_SOLUTION | ORAL | Status: DC | PRN

## 2022-04-11 MED ORDER — GABAPENTIN 300 MG PO CAPS
ORAL_CAPSULE | ORAL | Status: AC
  Administered 2022-04-11: 300 mg via ORAL
  Filled 2022-04-11: qty 1

## 2022-04-11 MED ORDER — FLUVOXAMINE MALEATE 50 MG PO TABS
200.0000 mg | ORAL_TABLET | Freq: Every day | ORAL | Status: DC
  Administered 2022-04-11: 200 mg via ORAL
  Filled 2022-04-11 (×2): qty 4

## 2022-04-11 MED ORDER — DEXTROSE 50 % IV SOLN: 1.0000 | Freq: Once | INTRAVENOUS | Status: AC

## 2022-04-11 MED ORDER — PANTOPRAZOLE SODIUM 40 MG PO TBEC
40.0000 mg | DELAYED_RELEASE_TABLET | Freq: Two times a day (BID) | ORAL | Status: DC
  Administered 2022-04-11 – 2022-04-12 (×3): 40 mg via ORAL
  Filled 2022-04-11 (×3): qty 1

## 2022-04-11 MED ORDER — PHENYLEPHRINE HCL (PRESSORS) 10 MG/ML IV SOLN
INTRAVENOUS | Status: DC | PRN
  Administered 2022-04-11 (×2): 80 ug via INTRAVENOUS

## 2022-04-11 SURGICAL SUPPLY — 75 items
ATTUNE PSFEM LTSZ4 NARCEM KNEE (Femur) IMPLANT
ATTUNE PSRP INSR SZ4 10 KNEE (Insert) IMPLANT
BASE TIBIAL ROT PLAT SZ 3 KNEE (Knees) IMPLANT
BATTERY INSTRU NAVIGATION (MISCELLANEOUS) ×4 IMPLANT
BLADE CLIPPER SURG (BLADE) IMPLANT
BLADE SAW 70X12.5 (BLADE) ×1 IMPLANT
BLADE SAW 90X13X1.19 OSCILLAT (BLADE) ×1 IMPLANT
BLADE SAW 90X25X1.19 OSCILLAT (BLADE) ×1 IMPLANT
BONE CEMENT GENTAMICIN (Cement) ×2 IMPLANT
CEMENT BONE GENTAMICIN 40 (Cement) IMPLANT
CEMENT HV SMART SET (Cement) IMPLANT
COMP PATELLA ATTUNE 29MM (Knees) ×1 IMPLANT
COMPONENT PATELLA ATTUNE 29MM (Knees) IMPLANT
COOLER POLAR GLACIER W/PUMP (MISCELLANEOUS) ×1 IMPLANT
CUFF TOURN SGL QUICK 24 (TOURNIQUET CUFF)
CUFF TOURN SGL QUICK 34 (TOURNIQUET CUFF)
CUFF TRNQT CYL 24X4X16.5-23 (TOURNIQUET CUFF) IMPLANT
CUFF TRNQT CYL 34X4.125X (TOURNIQUET CUFF) IMPLANT
DRAPE 3/4 80X56 (DRAPES) ×1 IMPLANT
DRAPE INCISE IOBAN 66X45 STRL (DRAPES) IMPLANT
DRSG DERMACEA NONADH 3X8 (GAUZE/BANDAGES/DRESSINGS) ×1 IMPLANT
DRSG MEPILEX SACRM 8.7X9.8 (GAUZE/BANDAGES/DRESSINGS) ×1 IMPLANT
DRSG OPSITE POSTOP 4X14 (GAUZE/BANDAGES/DRESSINGS) ×1 IMPLANT
DRSG TEGADERM 4X4.75 (GAUZE/BANDAGES/DRESSINGS) ×1 IMPLANT
DURAPREP 26ML APPLICATOR (WOUND CARE) ×2 IMPLANT
ELECT CAUTERY BLADE 6.4 (BLADE) ×1 IMPLANT
ELECT REM PT RETURN 9FT ADLT (ELECTROSURGICAL) ×1
ELECTRODE REM PT RTRN 9FT ADLT (ELECTROSURGICAL) ×1 IMPLANT
EX-PIN ORTHOLOCK NAV 4X150 (PIN) ×2 IMPLANT
GLOVE BIOGEL M STRL SZ7.5 (GLOVE) ×2 IMPLANT
GLOVE BIOGEL PI IND STRL 7.5 (GLOVE) ×1 IMPLANT
GLOVE PI ORTHO PRO STRL 7.5 (GLOVE) ×2 IMPLANT
GLOVE SURG UNDER POLY LF SZ7.5 (GLOVE) ×1 IMPLANT
GOWN STRL REUS W/ TWL LRG LVL3 (GOWN DISPOSABLE) ×2 IMPLANT
GOWN STRL REUS W/ TWL XL LVL3 (GOWN DISPOSABLE) ×1 IMPLANT
GOWN STRL REUS W/TWL LRG LVL3 (GOWN DISPOSABLE) ×2
GOWN STRL REUS W/TWL XL LVL3 (GOWN DISPOSABLE) ×1
HEMOVAC 400CC 10FR (MISCELLANEOUS) ×1 IMPLANT
HOLDER FOLEY CATH W/STRAP (MISCELLANEOUS) ×1 IMPLANT
HOOD PEEL AWAY T7 (MISCELLANEOUS) ×2 IMPLANT
IV NS IRRIG 3000ML ARTHROMATIC (IV SOLUTION) ×1 IMPLANT
KIT TURNOVER KIT A (KITS) ×1 IMPLANT
KNIFE SCULPS 14X20 (INSTRUMENTS) ×1 IMPLANT
MANIFOLD NEPTUNE II (INSTRUMENTS) ×2 IMPLANT
NDL SPNL 20GX3.5 QUINCKE YW (NEEDLE) ×2 IMPLANT
NEEDLE SPNL 20GX3.5 QUINCKE YW (NEEDLE) ×2 IMPLANT
NS IRRIG 500ML POUR BTL (IV SOLUTION) ×1 IMPLANT
PACK TOTAL KNEE (MISCELLANEOUS) ×1 IMPLANT
PAD ABD DERMACEA PRESS 5X9 (GAUZE/BANDAGES/DRESSINGS) ×2 IMPLANT
PAD WRAPON POLAR KNEE (MISCELLANEOUS) ×1 IMPLANT
PIN DRILL FIX HALF THREAD (BIT) ×2 IMPLANT
PIN DRILL QUICK PACK ×2 IMPLANT
PIN FIXATION 1/8DIA X 3INL (PIN) ×1 IMPLANT
PULSAVAC PLUS IRRIG FAN TIP (DISPOSABLE) ×1
SOL PREP PVP 2OZ (MISCELLANEOUS) ×1
SOLUTION IRRIG SURGIPHOR (IV SOLUTION) ×1 IMPLANT
SOLUTION PREP PVP 2OZ (MISCELLANEOUS) ×1 IMPLANT
SPONGE DRAIN TRACH 4X4 STRL 2S (GAUZE/BANDAGES/DRESSINGS) ×1 IMPLANT
STAPLER SKIN PROX 35W (STAPLE) ×1 IMPLANT
STOCKINETTE IMPERV 14X48 (MISCELLANEOUS) IMPLANT
STRAP TIBIA SHORT (MISCELLANEOUS) ×1 IMPLANT
SUCTION FRAZIER HANDLE 10FR (MISCELLANEOUS) ×1
SUCTION TUBE FRAZIER 10FR DISP (MISCELLANEOUS) ×1 IMPLANT
SUT VIC AB 0 CT1 36 (SUTURE) ×2 IMPLANT
SUT VIC AB 1 CT1 36 (SUTURE) ×2 IMPLANT
SUT VIC AB 2-0 CT2 27 (SUTURE) ×1 IMPLANT
SYR 30ML LL (SYRINGE) ×2 IMPLANT
TIBIAL BASE ROT PLAT SZ 3 KNEE (Knees) ×1 IMPLANT
TIP FAN IRRIG PULSAVAC PLUS (DISPOSABLE) ×1 IMPLANT
TOWEL OR 17X26 4PK STRL BLUE (TOWEL DISPOSABLE) IMPLANT
TOWER CARTRIDGE SMART MIX (DISPOSABLE) ×1 IMPLANT
TRAP FLUID SMOKE EVACUATOR (MISCELLANEOUS) ×1 IMPLANT
TRAY FOLEY MTR SLVR 16FR STAT (SET/KITS/TRAYS/PACK) ×1 IMPLANT
WATER STERILE IRR 1000ML POUR (IV SOLUTION) ×1 IMPLANT
WRAPON POLAR PAD KNEE (MISCELLANEOUS) ×1

## 2022-04-11 NOTE — Evaluation (Signed)
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  Clinical Impression  Patient is agreeable to PT evaluation. She is independent with mobility at baseline and lives a home with her spouse. The patient reports she has a 3-in-1 at home as well as a rolling walker already. Today, the patient reports 8/10 pain in the left knee. She required no assistance for bed mobility. Transfers performed with Min guard. Gait training performed for 92f with rolling walker with cues for sequencing of rolling walker and BLE. Issued HEP and initiated therapeutic exercises during this session. Educated patient on positioning to promote knee ROM. Recommend to continue PT to maximize independence and facilitate return to prior level of function. Anticipate patient can return home with family support.      Recommendations for follow up therapy are one component of a multi-disciplinary discharge planning process, led by the attending physician.  Recommendations may be updated based on patient status, additional functional criteria and insurance authorization.  Follow Up Recommendations Home health PT      Assistance Recommended at Discharge PRN  Patient can return home with the following  Assist for transportation;Help with stairs or ramp for entrance    Equipment Recommendations None recommended by PT  Recommendations for Other Services       Functional Status Assessment Patient has had a recent decline in their functional status and demonstrates the ability to make significant improvements in function in a reasonable and predictable amount of time.     Precautions / Restrictions Precautions Precautions: Knee Precaution Booklet Issued: Yes (comment) Required Braces or Orthoses:  (KI not required as  patient able to perform SLR independently) Restrictions Weight Bearing Restrictions: Yes LLE Weight Bearing: Weight bearing as tolerated      Mobility  Bed Mobility Overal bed mobility: Needs Assistance Bed Mobility: Supine to Sit, Sit to Supine     Supine to sit: Modified independent (Device/Increase time), HOB elevated Sit to supine: Modified independent (Device/Increase time), HOB elevated        Transfers Overall transfer level: Needs assistance Equipment used: Rolling walker (2 wheels) Transfers: Sit to/from Stand Sit to Stand: Min guard           General transfer comment: verbal cues for safety    Ambulation/Gait Ambulation/Gait assistance: Min guard, Supervision Gait Distance (Feet): 14 Feet Assistive device: Rolling walker (2 wheels) Gait Pattern/deviations: Step-to pattern, Antalgic, Decreased stride length Gait velocity: decreased     General Gait Details: verbal cues for rolling walker and BLE sequencing with carry over demonstrated. mild dizziness initially with stnading that subsided with increased standing time  Stairs            Wheelchair Mobility    Modified Rankin (Stroke Patients Only)       Balance Overall balance assessment: Needs assistance Sitting-balance support: Feet supported Sitting balance-Leahy Scale: Good     Standing balance support: Bilateral upper extremity supported, Reliant on assistive device for balance Standing balance-Leahy Scale: Fair                               Pertinent Vitals/Pain Pain Assessment Pain Assessment: 0-10 Pain Score: 8  Pain Descriptors / Indicators: Sore Pain Intervention(s): Limited activity within patient's tolerance, Monitored during session, Premedicated before  session, Repositioned (polar care re-applied at end of session)    Home Living Family/patient expects to be discharged to:: Private residence Living Arrangements: Spouse/significant other Available Help at  Discharge: Family Type of Home: House Home Access: Level entry       [place]: Two level;Able to live on main level with bedroom/bathroom Home Equipment: Conservation officer, nature (2 wheels);BSC/3in1      Prior Function Prior Level of Function : Independent/Modified Independent                     Hand Dominance        Extremity/Trunk Assessment   Upper Extremity Assessment Upper Extremity Assessment: Overall WFL for tasks assessed    Lower Extremity Assessment Lower Extremity Assessment: LLE deficits/detail LLE Deficits / Details: SLR independently x 5 reps. patient able to activate hip, knee, ankle movement LLE Sensation: WNL       Communication   Communication: No difficulties  Cognition Arousal/Alertness: Awake/alert Behavior During Therapy: WFL for tasks assessed/performed Overall Cognitive Status: Within Functional Limits for tasks assessed                                          General Comments      Exercises Total Joint Exercises Ankle Circles/Pumps: AROM, Strengthening, Left, 5 reps, Supine Quad Sets: AROM, Strengthening, Left, 5 reps, Supine Hip ABduction/ADduction: AROM, Strengthening, Left, 5 reps, Supine Straight Leg Raises: AROM, Strengthening, Left, 5 reps, Supine Long Arc Quad: AAROM, Strengthening, Left, 5 reps, Supine Goniometric ROM: L knee 4-59 degrees   Assessment/Plan    PT Assessment Patient needs continued PT services  PT Problem List Decreased strength;Decreased range of motion;Decreased activity tolerance;Decreased balance;Decreased mobility;Pain       PT Treatment Interventions DME instruction;Gait training;Stair training;Functional mobility training;Therapeutic activities;Therapeutic exercise;Balance training;Neuromuscular re-education;Cognitive remediation;Patient/family education    PT Goals (Current goals can be found in the Care Plan section)  Acute Rehab PT Goals Patient Stated Goal: to be as independent  as possible PT Goal Formulation: With patient Time For Goal Achievement: 04/25/22 Potential to Achieve Goals: Good    Frequency BID     Co-evaluation               AM-PAC PT "6 Clicks" Mobility  Outcome Measure Help needed turning from your back to your side while in a flat bed without using bedrails?: None Help needed moving from lying on your back to sitting on the side of a flat bed without using bedrails?: A Little Help needed moving to and from a bed to a chair (including a wheelchair)?: A Little Help needed standing up from a chair using your arms (e.g., wheelchair or bedside chair)?: A Little Help needed to walk in hospital room?: A Little Help needed climbing 3-5 steps with a railing? : A Little 6 Click Score: 19    End of Session Equipment Utilized During Treatment: Gait belt Activity Tolerance: Patient tolerated treatment well Patient left: in bed;with call bell/phone within reach;with bed alarm set;with SCD's reapplied (polar care re-applied, bone foam also in place LLE) Nurse Communication: Mobility status (patient voided 300 mL urine) PT Visit Diagnosis: Difficulty in walking, not elsewhere classified (R26.2);Pain Pain - Right/Left: Left Pain - part of body: Knee    Time: 1400-1434 PT Time Calculation (min) (ACUTE ONLY): 34 min   Charges:   PT Evaluation $PT Eval Low Complexity: 1 Low  PT Treatments $Therapeutic Exercise: 8-22 mins       Minna Merritts, PT, MPT   Percell Locus 04/11/2022, 2:50 PM

## 2022-04-11 NOTE — Transfer of Care (Signed)
Immediate Anesthesia Transfer of Care Note  Patient: Joanna Mendoza  Procedure(s) Performed: COMPUTER ASSISTED TOTAL KNEE ARTHROPLASTY (Left: Knee)  Patient Location: PACU  Anesthesia Type:Spinal  Level of Consciousness: awake, alert , and oriented  Airway & Oxygen Therapy: Patient Spontanous Breathing  Post-op Assessment: Report given to RN and Post -op Vital signs reviewed and stable  Post vital signs: Reviewed and stable  Last Vitals:  Vitals Value Taken Time  BP 111/76 04/11/22 1050  Temp 36.1 C 04/11/22 1050  Pulse 84 04/11/22 1050  Resp 12 04/11/22 1050  SpO2 92 % 04/11/22 1050    Last Pain:  Vitals:   04/11/22 0626  TempSrc: Temporal  PainSc: 7          Complications: No notable events documented.

## 2022-04-11 NOTE — Interval H&P Note (Signed)
History and Physical Interval Note:  04/11/2022 6:13 AM  Joanna Mendoza  has presented today for surgery, with the diagnosis of PRIMARY OSTEOARTHRITIS OF LEFT KNEE..  The various methods of treatment have been discussed with the patient and family. After consideration of risks, benefits and other options for treatment, the patient has consented to  Procedure(s): COMPUTER ASSISTED TOTAL KNEE ARTHROPLASTY (Left) as a surgical intervention.  The patient's history has been reviewed, patient examined, no change in status, stable for surgery.  I have reviewed the patient's chart and labs.  Questions were answered to the patient's satisfaction.     [place]

## 2022-04-11 NOTE — Plan of Care (Signed)

## 2022-04-11 NOTE — Progress Notes (Signed)
Patient came in and while in the chair started talking about feeling dizzy, we checked her CBG and it was 63.  I notified Dr. Erenest Rasher (anesthesia charge) of this and she gave me a verbal order for 1 amp of D50.  I acknowledged and placed the order. I also talked to Dr. Marry Guan about an order for Celebrex and he gave me a verbal order for it and I placed it.

## 2022-04-11 NOTE — Anesthesia Preprocedure Evaluation (Addendum)
Anesthesia Evaluation  Patient identified by MRN, date of birth, ID band Patient awake    Reviewed: Allergy & Precautions, NPO status , Patient's Chart, lab work & pertinent test results  History of Anesthesia Complications Negative for: history of anesthetic complications  Airway Mallampati: III  TM Distance: <3 FB Neck ROM: full    Dental  (+) Chipped, Dental Advidsory Given   Pulmonary neg pulmonary ROS, neg shortness of breath   Pulmonary exam normal        Cardiovascular Exercise Tolerance: Good hypertension, (-) angina (-) Past MI Normal cardiovascular exam     Neuro/Psych  PSYCHIATRIC DISORDERS      CVA    GI/Hepatic Neg liver ROS,GERD  Controlled,,  Endo/Other  negative endocrine ROS    Renal/GU Renal disease  negative genitourinary   Musculoskeletal   Abdominal   Peds  Hematology negative hematology ROS (+)   Anesthesia Other Findings Past Medical History: No date: Anxiety No date: Anxiety No date: Arthritis No date: Colon polyps No date: DDD (degenerative disc disease), lumbar No date: Depression No date: Diverticulosis 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 No date: Epigastric pain No date: Fatty liver No date: Frequent PVCs No date: GERD (gastroesophageal reflux disease) No date: History of chicken pox No date: IBS (irritable bowel syndrome) No date: Morbid obesity ([place]) No date: Nausea No date: Osteopenia No date: PSVT (paroxysmal supraventricular tachycardia) (HCC) No date: Restless legs No date: Status post rotator cuff repair No date: Stroke [facility])  Past Surgical History: No date: ABDOMINAL HYSTERECTOMY No date: benign; Left     Comment:  breast mass removed 2002: BREAST EXCISIONAL BIOPSY; Left     Comment:  neg 2002: BREAST SURGERY; Left     Comment:  Breast Biopsy 2011: CHOLECYSTECTOMY No date: COLONOSCOPY WITH ESOPHAGOGASTRODUODENOSCOPY (EGD) 08/07/2017: COLONOSCOPY WITH PROPOFOL; N/A      Comment:  Procedure: COLONOSCOPY WITH PROPOFOL;  Surgeon: Manya Silvas, MD;  Location: [facility] ENDOSCOPY;  Service:               Endoscopy;  Laterality: N/A; No date: ddd 08/07/2017: ESOPHAGOGASTRODUODENOSCOPY (EGD) WITH PROPOFOL; N/A     Comment:  Procedure: ESOPHAGOGASTRODUODENOSCOPY (EGD) WITH               PROPOFOL;  Surgeon: Manya Silvas, MD;  Location:               [facility] ENDOSCOPY;  Service: Endoscopy;  Laterality: N/A; No date: FOOT SURGERY 2008: KNEE ARTHROSCOPY; Left No date: KNEE ARTHROSCOPY No date: LAPAROSCOPIC BILATERAL SALPINGO OOPHERECTOMY No date: OOPHORECTOMY 04/06/2015: SHOULDER ACROMIOPLASTY; Right     Comment:  Procedure: SUBACROMIAL DECOMPRESSION, ROTATOR CUFF               REPAIR;  Surgeon: Dereck Leep, MD;  Location: [facility]               ORS;  Service: Orthopedics;  Laterality: Right;  BMI    Body Mass Index: 29.26 kg/m      Reproductive/Obstetrics negative OB ROS                             Anesthesia Physical Anesthesia Plan  ASA: 3  Anesthesia Plan: General/Spinal   Post-op Pain Management:    Induction: Intravenous  PONV Risk Score and Plan: 3 and Propofol infusion, TIVA, Ondansetron and Midazolam  Airway Management Planned: Natural Airway and  Nasal Cannula  Additional Equipment:   Intra-op Plan:   Post-operative Plan:   Informed Consent: I have reviewed the patients History and Physical, chart, labs and discussed the procedure including the risks, benefits and alternatives for the proposed anesthesia with the patient or authorized representative who has indicated his/her understanding and acceptance.     Dental Advisory Given  Plan Discussed with: Anesthesiologist, CRNA and Surgeon  Anesthesia Plan Comments: (Patient reports no bleeding problems and no anticoagulant use.  Plan for spinal with backup GA  Patient consented for risks of anesthesia including but not limited to:  - adverse  reactions to medications - damage to eyes, teeth, lips or other oral mucosa - nerve damage due to positioning  - risk of bleeding, infection and or nerve damage from spinal that could lead to paralysis - risk of headache or failed spinal - damage to teeth, lips or other oral mucosa - sore throat or hoarseness - damage to heart, brain, nerves, lungs, other parts of body or loss of life  Patient voiced understanding.)        Anesthesia Quick Evaluation

## 2022-04-11 NOTE — Op Note (Signed)
OPERATIVE NOTE  DATE OF SURGERY:  04/11/2022  PATIENT NAME:  Joanna Mendoza   DOB: 1946-02-25  MRN: [id number]  PRE-OPERATIVE DIAGNOSIS: Degenerative arthrosis of the left knee, primary  POST-OPERATIVE DIAGNOSIS:  Same  PROCEDURE:  Left total knee arthroplasty using computer-assisted navigation  SURGEON:  Marciano Sequin. M.D.  ASSISTANT: Cassell Smiles, PA-C (present and scrubbed throughout the case, critical for assistance with exposure, retraction, instrumentation, and closure)  ANESTHESIA: spinal  ESTIMATED BLOOD LOSS: 50 mL  FLUIDS REPLACED: 700 mL of crystalloid  TOURNIQUET TIME: 87 minutes  DRAINS: 2 medium Hemovac drains  SOFT TISSUE RELEASES: Anterior cruciate ligament, posterior cruciate ligament, deep medial collateral ligament, patellofemoral ligament  IMPLANTS UTILIZED: DePuy Attune size 4N posterior stabilized femoral component (cemented), size 3 rotating platform tibial component (cemented), 29 mm medialized dome patella (cemented), and a 10 mm stabilized rotating platform polyethylene insert.  INDICATIONS FOR SURGERY: Joanna Mendoza is a 76 y.o. year old female with a long history of progressive knee pain. X-rays demonstrated severe degenerative changes in tricompartmental fashion. The patient had not seen any significant improvement despite conservative nonsurgical intervention. After discussion of the risks and benefits of surgical intervention, the patient expressed understanding of the risks benefits and agree with plans for total knee arthroplasty.   The risks, benefits, and alternatives were discussed at length including but not limited to the risks of infection, bleeding, nerve injury, stiffness, blood clots, the need for revision surgery, cardiopulmonary complications, among others, and they were willing to proceed.  PROCEDURE IN DETAIL: The patient was brought into the operating room and, after adequate spinal anesthesia was achieved, a tourniquet was placed  on the patient's upper thigh. The patient's knee and leg were cleaned and prepped with alcohol and DuraPrep and draped in the usual sterile fashion. A "timeout" was performed as per usual protocol. The lower extremity was exsanguinated using an Esmarch, and the tourniquet was inflated to 300 mmHg. An anterior longitudinal incision was made followed by a standard mid vastus approach. The deep fibers of the medial collateral ligament were elevated in a subperiosteal fashion off of the medial flare of the tibia so as to maintain a continuous soft tissue sleeve. The patella was subluxed laterally and the patellofemoral ligament was incised. Inspection of the knee demonstrated severe degenerative changes with full-thickness loss of articular cartilage. Osteophytes were debrided using a rongeur. Anterior and posterior cruciate ligaments were excised. Two 4.0 mm Schanz pins were inserted in the femur and into the tibia for attachment of the array of trackers used for computer-assisted navigation. Hip center was identified using a circumduction technique. Distal landmarks were mapped using the computer. The distal femur and proximal tibia were mapped using the computer. The distal femoral cutting guide was positioned using computer-assisted navigation so as to achieve a 5 distal valgus cut. The femur was sized and it was felt that a size 4N femoral component was appropriate. A size 4 femoral cutting guide was positioned and the anterior cut was performed and verified using the computer. This was followed by completion of the posterior and chamfer cuts. Femoral cutting guide for the central box was then positioned in the center box cut was performed.  Attention was then directed to the proximal tibia. Medial and lateral menisci were excised. The extramedullary tibial cutting guide was positioned using computer-assisted navigation so as to achieve a 0 varus-valgus alignment and 3 posterior slope. The cut was performed  and verified using the computer. The proximal tibia was  sized and it was felt that a size 3 tibial tray was appropriate. Tibial and femoral trials were inserted followed by insertion of a 10 mm polyethylene insert. This allowed for excellent mediolateral soft tissue balancing both in flexion and in full extension. Finally, the patella was cut and prepared so as to accommodate a 29 mm medialized dome patella. A patella trial was placed and the knee was placed through a range of motion with excellent patellar tracking appreciated. The femoral trial was removed after debridement of posterior osteophytes. The central post-hole for the tibial component was reamed followed by insertion of a keel punch. Tibial trials were then removed. Cut surfaces of bone were irrigated with copious amounts of normal saline using pulsatile lavage and then suctioned dry. Polymethylmethacrylate cement with gentamicin was prepared in the usual fashion using a vacuum mixer. Cement was applied to the cut surface of the proximal tibia as well as along the undersurface of a size 3 rotating platform tibial component. Tibial component was positioned and impacted into place. Excess cement was removed using [employer] fast streamer. Cement was then applied to the cut surfaces of the femur as well as along the posterior flanges of the size 4N femoral component. The femoral component was positioned and impacted into place. Excess cement was removed using [employer] fast streamer. A 10 mm polyethylene trial was inserted and the knee was brought into full extension with steady axial compression applied. Finally, cement was applied to the backside of a 29 mm medialized dome patella and the patellar component was positioned and patellar clamp applied. Excess cement was removed using [employer] fast streamer. After adequate curing of the cement, the tourniquet was deflated after a total tourniquet time of 87 minutes. Hemostasis was achieved using electrocautery. The knee was  irrigated with copious amounts of normal saline using pulsatile lavage followed by 450 ml of Surgiphor and then suctioned dry. 20 mL of 1.3% Exparel and 60 mL of 0.25% Marcaine in 40 mL of normal saline was injected along the posterior capsule, medial and lateral gutters, and along the arthrotomy site. A 10 mm stabilized rotating platform polyethylene insert was inserted and the knee was placed through a range of motion with excellent mediolateral soft tissue balancing appreciated and excellent patellar tracking noted. 2 medium drains were placed in the wound bed and brought out through separate stab incisions. The medial parapatellar portion of the incision was reapproximated using interrupted sutures of #1 Vicryl. Subcutaneous tissue was approximated in layers using first #0 Vicryl followed #2-0 Vicryl. The skin was approximated with skin staples. A sterile dressing was applied.  The patient tolerated the procedure well and was transported to the recovery room in stable condition.    Lilianna Case P. Holley Bouche., M.D.

## 2022-04-11 NOTE — Anesthesia Procedure Notes (Signed)
Spinal  Patient location during procedure: OR Start time: 04/11/2022 7:20 AM End time: 04/11/2022 7:23 AM Reason for block: surgical anesthesia Staffing Performed: resident/CRNA  Anesthesiologist: Dimas Millin, MD Resident/CRNA: Hedda Slade, CRNA Performed by: Hedda Slade, CRNA Authorized by: Hedda Slade, CRNA   Preanesthetic Checklist Completed: patient identified, IV checked, site marked, risks and benefits discussed, surgical consent, monitors and equipment checked, pre-op evaluation and timeout performed Spinal Block Patient position: sitting Prep: ChloraPrep Patient monitoring: heart rate, continuous pulse ox, blood pressure and cardiac monitor Approach: midline Location: L3-4 Injection technique: single-shot Needle Needle type: Whitacre and Introducer  Needle gauge: 24 G Needle length: 9 cm Assessment Events: CSF return Additional Notes Sterile aseptic technique used throughout the procedure.  Negative paresthesia. Negative blood return. Positive free-flowing CSF. Expiration date of kit checked and confirmed. Patient tolerated procedure well, without complications.

## 2022-04-12 DIAGNOSIS — M1712 Unilateral primary osteoarthritis, left knee: Secondary | ICD-10-CM | POA: Diagnosis not present

## 2022-04-12 MED ORDER — OXYCODONE HCL 5 MG PO TABS: 5.0000 mg | ORAL_TABLET | ORAL | 0 refills | Status: DC | PRN

## 2022-04-12 MED ORDER — ENOXAPARIN SODIUM 40 MG/0.4ML IJ SOSY: 40.0000 mg | PREFILLED_SYRINGE | INTRAMUSCULAR | 0 refills | Status: DC

## 2022-04-12 MED ORDER — TRAMADOL HCL 50 MG PO TABS: 50.0000 mg | ORAL_TABLET | ORAL | 0 refills | Status: DC | PRN

## 2022-04-12 MED ORDER — CELECOXIB 200 MG PO CAPS: 200.0000 mg | ORAL_CAPSULE | Freq: Two times a day (BID) | ORAL | 0 refills | Status: DC

## 2022-04-12 NOTE — Progress Notes (Addendum)
  Subjective: 1 Day Post-Op Procedure(s) (LRB): COMPUTER ASSISTED TOTAL KNEE ARTHROPLASTY (Left) Patient reports pain as well-controlled.   Patient is well, and has had no acute complaints or problems Plan is to go Home after hospital stay. Negative for chest pain and shortness of breath Fever: no Gastrointestinal: negative for nausea and vomiting.   Patient has not had a bowel movement.  Objective: Vital signs in last 24 hours: Temp:  [97 F (36.1 C)-98.9 F (37.2 C)] 98 F (36.7 C) (11/09 0817) Pulse Rate:  [56-100] 76 (11/09 0817) Resp:  [11-23] 16 (11/09 0817) BP: (111-156)/(60-92) 125/81 (11/09 0817) SpO2:  [92 %-100 %] 100 % (11/09 0817)  Intake/Output from previous day:  Intake/Output Summary (Last 24 hours) at 04/12/2022 0858 Last data filed at 04/12/2022 0607 Gross per 24 hour  Intake 1823.99 ml  Output 1650 ml  Net 173.99 ml    Intake/Output this shift: No intake/output data recorded.  Labs: No results for input(s): "HGB" in the last 72 hours. No results for input(s): "WBC", "RBC", "HCT", "PLT" in the last 72 hours. No results for input(s): "NA", "K", "CL", "CO2", "BUN", "CREATININE", "GLUCOSE", "CALCIUM" in the last 72 hours. No results for input(s): "LABPT", "INR" in the last 72 hours.   EXAM General - Patient is Alert, Appropriate, and Oriented Extremity - Neurovascular intact Dorsiflexion/Plantar flexion intact Compartment soft Dressing/Incision -Postoperative dressing remains in place., Polar Care in place and working. , Hemovac in place. , Following removal of post-op dressing, minimal drainage noted.  Motor Function - intact, moving foot and toes well on exam. Able to perform independent SLR.  Cardiovascular-  slightly bradycardic, regular rhythm, no m/r/g Respiratory- Lungs clear to auscultation bilaterally Gastrointestinal- soft, nontender, and active bowel sounds   Assessment/Plan: 1 Day Post-Op Procedure(s) (LRB): COMPUTER ASSISTED TOTAL KNEE  ARTHROPLASTY (Left) Principal Problem:   Total knee replacement status, left  Estimated body mass index is 34.05 kg/m as calculated from the following:   Height as of this encounter: '4\' 10"'$  (1.473 m).   Weight as of this encounter: 73.9 kg. Advance diet Up with therapy  Anticipate d/c later this PM.  Post-op dressing removed. , Hemovac removed., and Mini compression dressing applied.   DVT Prophylaxis - Lovenox, Ted hose, and SCDs Weight-Bearing as tolerated to left leg  Cassell Smiles, PA-C [facility] Orthopaedic Surgery 04/12/2022, 8:58 AM

## 2022-04-12 NOTE — Progress Notes (Signed)
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    PT Comments    The patient is making excellent progress with functional independence and with increased activity tolerance this session. Gait training progressed to hallway ambulation with improving gait kinematics with increased ambulation distance. Stair training was completed this session. Reviewed positioning instruction for left knee to promote knee ROM. From a mobility perspective, anticipate the patient can return home today after second therapy session.    Recommendations for follow up therapy are one component of a multi-disciplinary discharge planning process, led by the attending physician.  Recommendations may be updated based on patient status, additional functional criteria and insurance authorization.  Follow Up Recommendations  Home health PT     Assistance Recommended at Discharge PRN  Patient can return home with the following Assist for transportation;Help with stairs or ramp for entrance   Equipment Recommendations  None recommended by PT    Recommendations for Other Services       Precautions / Restrictions Precautions Precautions: Knee Restrictions Weight Bearing Restrictions: Yes LLE Weight Bearing: Weight bearing as tolerated     Mobility  Bed Mobility                    Transfers Overall transfer level: Needs assistance Equipment used: Rolling walker (2 wheels) Transfers: Sit to/from Stand Sit to Stand: Supervision           General transfer comment: good safety awareness with transfers. supervision for safety    Ambulation/Gait Ambulation/Gait assistance: Supervision, Min guard Gait Distance (Feet): 130 Feet Assistive device: Rolling walker (2 wheels) Gait  Pattern/deviations: Step-to pattern, Step-through pattern, Decreased stride length Gait velocity: decreased     General Gait Details: verbal cues for improved gait kinematics including heel toe gait pattern on the left with cues for sequencing of BLE and rolling walker. gait pattern improved with increased gait distance   Stairs Stairs: Yes Stairs assistance: Supervision Stair Management: Two rails, Step to pattern, Forwards Number of Stairs: 4 General stair comments: patient went up and down 4 steps without difficulty with correct sequencing with initial verbal cues and visual demonstration.   Wheelchair Mobility    Modified Rankin (Stroke Patients Only)       Balance Overall balance assessment: Needs assistance Sitting-balance support: Feet supported Sitting balance-Leahy Scale: Good     Standing balance support: Bilateral upper extremity supported, Reliant on assistive device for balance Standing balance-Leahy Scale: Fair                              Cognition Arousal/Alertness: Awake/alert Behavior During Therapy: WFL for tasks assessed/performed Overall Cognitive Status: Within Functional Limits for tasks assessed                                          Exercises Total Joint Exercises Goniometric ROM: will measure ROM  in the afternoon session    General Comments General comments (skin integrity, edema, etc.): emphasis on position of L knee to promote knee ROM, including have a towel roll or bone foam placed with sitting and supine to promote knee extension  Pertinent Vitals/Pain Pain Assessment Pain Assessment: 0-10 Pain Score: 5  Pain Location: L knee Pain Descriptors / Indicators: Discomfort Pain Intervention(s): Limited activity within patient's tolerance, Monitored during session, Premedicated before session, Repositioned (polar care reinforeced)    Home Living                          Prior Function             PT Goals (current goals can now be found in the care plan section) Acute Rehab PT Goals Patient Stated Goal: to be as independent as possible PT Goal Formulation: With patient Time For Goal Achievement: 04/25/22 Potential to Achieve Goals: Good Progress towards PT goals: Progressing toward goals    Frequency    BID      PT Plan Current plan remains appropriate    Co-evaluation              AM-PAC PT "6 Clicks" Mobility   Outcome Measure  Help needed turning from your back to your side while in a flat bed without using bedrails?: None Help needed moving from lying on your back to sitting on the side of a flat bed without using bedrails?: A Little Help needed moving to and from a bed to a chair (including a wheelchair)?: A Little Help needed standing up from a chair using your arms (e.g., wheelchair or bedside chair)?: A Little Help needed to walk in hospital room?: A Little Help needed climbing 3-5 steps with a railing? : A Little 6 Click Score: 19    End of Session Equipment Utilized During Treatment: Gait belt Activity Tolerance: Patient tolerated treatment well Patient left: in chair;with call bell/phone within reach (OT present) Nurse Communication: Mobility status PT Visit Diagnosis: Difficulty in walking, not elsewhere classified (R26.2);Pain Pain - Right/Left: Left Pain - part of body: Knee     Time: 0927-0951 PT Time Calculation (min) (ACUTE ONLY): 24 min  Charges:  $Gait Training: 23-37 mins                    Minna Merritts, PT, MPT    Percell Locus 04/12/2022, 11:31 AM

## 2022-04-12 NOTE — Progress Notes (Signed)
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    PT Comments    Patient is agreeable to PT session and eager to go home today. The patient has progressed to ambulating 158f with rolling walker with reinforcement of proper gait kinematics. Performed therapeutic exercise independently with cues for technique to increase ROM of left knee and for strengthening. Increased left knee AROM, 3-92 degrees. Mobility is adequate for discharge home. Recommend HHPT at discharge.    Recommendations for follow up therapy are one component of a multi-disciplinary discharge planning process, led by the attending physician.  Recommendations may be updated based on patient status, additional functional criteria and insurance authorization.  Follow Up Recommendations  Home health PT     Assistance Recommended at Discharge PRN  Patient can return home with the following Assist for transportation;Help with stairs or ramp for entrance   Equipment Recommendations  None recommended by PT    Recommendations for Other Services       Precautions / Restrictions Precautions Precautions: Knee Precaution Booklet Issued: Yes (comment) Restrictions Weight Bearing Restrictions: Yes LLE Weight Bearing: Weight bearing as tolerated     Mobility  Bed Mobility               General bed mobility comments: not assessed as patient sitting up on arrival and post session    Transfers Overall transfer level: Needs assistance Equipment used: Rolling walker (2 wheels) Transfers: Sit to/from Stand Sit to Stand: Modified independent (Device/Increase time)           General transfer comment: for standing from recliner and from toilet. good safety awareness    Ambulation/Gait Ambulation/Gait  assistance: Modified independent (Device/Increase time), Supervision Gait Distance (Feet): 190 Feet Assistive device: Rolling walker (2 wheels) Gait Pattern/deviations: Step-through pattern, Decreased stance time - left Gait velocity: decreased     General Gait Details: reinforcement of heel toe and step through gait pattern. patient ambulated the lap  around nursing station and in the room   Stairs Stairs: Yes Stairs assistance: Supervision Stair Management: Two rails, Step to pattern, Forwards Number of Stairs: 4 General stair comments: patient went up and down 4 steps without difficulty with correct sequencing with initial verbal cues and visual demonstration.   Wheelchair Mobility    Modified Rankin (Stroke Patients Only)       Balance Overall balance assessment: Needs assistance Sitting-balance support: Feet supported Sitting balance-Leahy Scale: Good     Standing balance support: Bilateral upper extremity supported, Reliant on assistive device for balance Standing balance-Leahy Scale: Fair                              Cognition Arousal/Alertness: Awake/alert Behavior During Therapy: WFL for tasks assessed/performed Overall Cognitive Status: Within Functional Limits for tasks assessed                                          Exercises Total Joint Exercises Ankle Circles/Pumps: AROM, Strengthening, Left, 5 reps, Seated Quad Sets: AROM, Strengthening, Left, 5 reps, Seated Long Arc Quad: AROM, Strengthening, Left, 5 reps, Seated Goniometric ROM: L knee 3-92 degrees Other Exercises Other Exercises: verbal cues  for exercise technique for strengthening    General Comments General comments (skin integrity, edema, etc.): spoke with daugther Baker Janus with the patient's persmission to update her on mobility status      Pertinent Vitals/Pain Pain Assessment Pain Assessment: Faces Pain Score: 5  Faces Pain Scale: Hurts little more Pain  Location: L knee Pain Descriptors / Indicators: Discomfort Pain Intervention(s): Limited activity within patient's tolerance, Monitored during session (reinforced polar care)    Home Living Family/patient expects to be discharged to:: Private residence Living Arrangements: Spouse/significant other Available Help at Discharge: Family Type of Home: House Home Access: Level entry       Home Layout: Two level;Able to live on main level with bedroom/bathroom Home Equipment: Rolling Walker (2 wheels);BSC/3in1;Tub bench      Prior Function            PT Goals (current goals can now be found in the care plan section) Acute Rehab PT Goals Patient Stated Goal: to be as independent as possible PT Goal Formulation: With patient Time For Goal Achievement: 04/25/22 Potential to Achieve Goals: Good Progress towards PT goals: Progressing toward goals    Frequency    BID      PT Plan Current plan remains appropriate    Co-evaluation              AM-PAC PT "6 Clicks" Mobility   Outcome Measure  Help needed turning from your back to your side while in a flat bed without using bedrails?: None Help needed moving from lying on your back to sitting on the side of a flat bed without using bedrails?: A Little Help needed moving to and from a bed to a chair (including a wheelchair)?: A Little Help needed standing up from a chair using your arms (e.g., wheelchair or bedside chair)?: A Little Help needed to walk in hospital room?: A Little Help needed climbing 3-5 steps with a railing? : A Little 6 Click Score: 19    End of Session Equipment Utilized During Treatment: Gait belt Activity Tolerance: Patient tolerated treatment well Patient left: in chair;with call bell/phone within reach (polar care re-applied, towel roll under LLE) Nurse Communication: Mobility status PT Visit Diagnosis: Difficulty in walking, not elsewhere classified (R26.2);Pain Pain - Right/Left: Left Pain -  part of body: Knee     Time: 7616-0737 PT Time Calculation (min) (ACUTE ONLY): 26 min  Charges:  $Gait Training: 8-22 mins $Therapeutic Exercise: 8-22 mins                    Minna Merritts, PT, MPT    Percell Locus 04/12/2022, 2:45 PM

## 2022-04-12 NOTE — Progress Notes (Signed)
Pt discharged to home via daughter's personal vehicle. Discharge paperwork provided, MD/NP/PA sent scripts to Pharmacy of choice

## 2022-04-12 NOTE — TOC Progression Note (Signed)
Transition of Care [facility]) - Progression Note    Patient Details  Name: Joanna Mendoza MRN: [id number] Date of Birth: Feb 12, 1946  Transition of [facility]) CM/SW Contact  Laurena Slimmer, RN Phone Number: 04/12/2022, 4:14 PM  Clinical Narrative:    Attempt to discuss therapy recommendation and discharge plan. Will re-attempt at a later time.         Expected Discharge Plan and Services                                                 Social Determinants of Health (SDOH) Interventions    Readmission Risk Interventions     No data to display

## 2022-04-12 NOTE — Plan of Care (Signed)

## 2022-04-12 NOTE — Anesthesia Postprocedure Evaluation (Signed)
Anesthesia Post Note  Patient: Joanna Mendoza  Procedure(s) Performed: COMPUTER ASSISTED TOTAL KNEE ARTHROPLASTY (Left: Knee)  Patient location during evaluation: Nursing Unit Anesthesia Type: Spinal Level of consciousness: oriented and awake and alert Pain management: pain level controlled Vital Signs Assessment: post-procedure vital signs reviewed and stable Respiratory status: spontaneous breathing, respiratory function stable and patient connected to nasal cannula oxygen Cardiovascular status: blood pressure returned to baseline and stable Postop Assessment: no headache, no backache, no apparent nausea or vomiting and adequate PO intake Anesthetic complications: no   No notable events documented.   Last Vitals:  Vitals:   04/12/22 0511 04/12/22 0817  BP: 112/63 125/81  Pulse: (!) 56 76  Resp: 20 16  Temp: 37.1 C 36.7 C  SpO2: 98% 100%    Last Pain:  Vitals:   04/11/22 2159  TempSrc:   PainSc: 4                  Sylvie Mifsud,  Clearnce Sorrel

## 2022-04-12 NOTE — Discharge Summary (Signed)
Physician Discharge Summary  Patient ID: Joanna Mendoza MRN: [id number] DOB/AGE: 10-31-1945 76 y.o.  Admit date: 04/11/2022 Discharge date: 04/12/2022  Admission Diagnoses:  Total knee replacement status, left [Z96.652]  Surgeries:Procedure(s):   Left total knee arthroplasty using computer-assisted navigation   SURGEON:  Marciano Sequin. M.D.   ASSISTANT: Cassell Smiles, PA-C (present and scrubbed throughout the case, critical for assistance with exposure, retraction, instrumentation, and closure)   ANESTHESIA: spinal   ESTIMATED BLOOD LOSS: 50 mL   FLUIDS REPLACED: 700 mL of crystalloid   TOURNIQUET TIME: 87 minutes   DRAINS: 2 medium Hemovac drains   SOFT TISSUE RELEASES: Anterior cruciate ligament, posterior cruciate ligament, deep medial collateral ligament, patellofemoral ligament   IMPLANTS UTILIZED: DePuy Attune size 4N posterior stabilized femoral component (cemented), size 3 rotating platform tibial component (cemented), 29 mm medialized dome patella (cemented), and a 10 mm stabilized rotating platform polyethylene insert.    Discharge Diagnoses: Patient Active Problem List   Diagnosis Date Noted   Total knee replacement status, left 04/11/2022   Depression    OCD (obsessive compulsive disorder) 03/30/2021   Degenerative disc disease, lumbar 12/30/2020   Osteopenia 12/30/2020   Cognitive impairment 12/30/2020   Severe recurrent major depression without psychotic features ([place]) 12/28/2020   Suicidal ideation 12/28/2020   PTSD (post-traumatic stress disorder) 12/28/2020   Primary osteoarthritis of left knee 02/09/2019   Renal cyst 07/30/2017   Urge incontinence 07/30/2017   Urinary urgency 07/30/2017   Urinary frequency 07/30/2017   Microhematuria 07/30/2017   Hx of adenomatous colonic polyps 07/08/2017   Other insomnia 11/22/2016   Frequent PVCs 08/22/2016   PSVT (paroxysmal supraventricular tachycardia) 06/27/2016   Chronic RUQ pain 04/01/2014   Mixed  hyperlipidemia 12/11/2013    Past Medical History:  Diagnosis Date   Anxiety    Anxiety    Arthritis    Cognitive impairment    Colon polyps    DDD (degenerative disc disease), lumbar    Depression    Diverticulosis    Duodenal mass    Epigastric pain    Fatty liver    Frequent PVCs    GERD (gastroesophageal reflux disease)    History of chicken pox    Hypertension    IBS (irritable bowel syndrome)    Morbid obesity (HCC)    Nausea    OCD (obsessive compulsive disorder)    Osteopenia    Pre-diabetes    PSVT (paroxysmal supraventricular tachycardia)    Renal cyst    Restless legs    Status post rotator cuff repair    Stroke [facility])    age 76   Suicidal ideation      Transfusion:    Consultants (if any):   Discharged Condition: Improved  Hospital Course: Joanna Mendoza is an 76 y.o. female who was admitted 04/11/2022 with a diagnosis of left knee osteoarthritis and went to the operating room on 04/11/2022 and underwent left total knee arthoplasty. The patient received perioperative antibiotics for prophylaxis (see below). The patient tolerated the procedure well and was transported to PACU in stable condition. After meeting PACU criteria, the patient was subsequently transferred to the Orthopaedics/Rehabilitation unit.   The patient received DVT prophylaxis in the form of early mobilization, Lovenox, TED hose, and SCDs . A sacral pad had been placed and heels were elevated off of the bed with rolled towels in order to protect skin integrity. Foley catheter was discontinued on postoperative day #0. Wound drains were discontinued on postoperative day #1.  The surgical incision was healing well without signs of infection.  Physical therapy was initiated postoperatively for transfers, gait training, and strengthening. Occupational therapy was initiated for activities of daily living and evaluation for assisted devices. Rehabilitation goals were reviewed in detail with the patient.  The patient made steady progress with physical therapy and physical therapy recommended discharge to Home.   The patient achieved the preliminary goals of this hospitalization and was felt to be medically and orthopaedically appropriate for discharge.  She was given perioperative antibiotics:  Anti-infectives (From admission, onward)    Start     Dose/Rate Route Frequency Ordered Stop   04/11/22 1400  ceFAZolin (ANCEF) IVPB 2g/100 mL premix        2 g 200 mL/hr over 30 Minutes Intravenous Every 6 hours 04/11/22 1253 04/11/22 2105   04/11/22 0635  ceFAZolin (ANCEF) 2-4 GM/100ML-% IVPB       Note to Pharmacy: Herby Abraham W: cabinet override      04/11/22 0635 04/11/22 2105   04/11/22 0600  ceFAZolin (ANCEF) IVPB 2g/100 mL premix        2 g 200 mL/hr over 30 Minutes Intravenous On call to O.R. 04/10/22 2228 04/11/22 0745     .  Recent vital signs:  Vitals:   04/12/22 0817 04/12/22 1519  BP: 125/81 (!) 101/47  Pulse: 76 61  Resp: 16 16  Temp: 98 F (36.7 C) 98.2 F (36.8 C)  SpO2: 100% 97%    Recent laboratory studies:  No results for input(s): "WBC", "HGB", "HCT", "PLT", "K", "CL", "CO2", "BUN", "CREATININE", "GLUCOSE", "CALCIUM", "LABPT", "INR" in the last 72 hours.  Diagnostic Studies: DG Knee Left Port  Result Date: 04/11/2022 CLINICAL DATA:  Postop knee arthroplasty EXAM: PORTABLE LEFT KNEE - 1-2 VIEW COMPARISON:  None Available. FINDINGS: Postsurgical changes reflecting left knee arthroplasty are seen. Hardware alignment is within expected limits. There is no evidence of hardware related complication. Knee alignment is maintained. There is expected postoperative soft tissue swelling and gas, with a surgical drain terminating posterior to the patella. IMPRESSION: Status post left knee arthroplasty without evidence of complication. Electronically Signed   By: Valetta Mole M.D.   On: 04/11/2022 11:09    Discharge Medications:   Allergies as of 04/12/2022       Reactions    Celexa [citalopram] Other (See Comments)   "unknown"   Cogentin [benztropine] Other (See Comments)   "unknown"   Flagyl [metronidazole] Other (See Comments)   "unknown"   Hydrocodone    unsure   Lorazepam Other (See Comments)   Worsening in irritability   Adhesive [tape] Rash   Ok to use paper tape        Medication List     TAKE these medications    calcium carbonate 600 MG Tabs tablet Commonly known as: OS-CAL Take 600 mg by mouth daily.   carboxymethylcellulose 0.5 % Soln Commonly known as: REFRESH PLUS Place 2 drops into both eyes daily as needed.   celecoxib 200 MG capsule Commonly known as: CELEBREX Take 1 capsule (200 mg total) by mouth 2 (two) times daily.   enoxaparin 40 MG/0.4ML injection Commonly known as: LOVENOX Inject 0.4 mLs (40 mg total) into the skin daily for 14 days.   estradiol 0.1 MG/GM vaginal cream Commonly known as: ESTRACE Place 1 Applicatorful vaginally 2 (two) times a week.   fluvoxaMINE 100 MG tablet Commonly known as: LUVOX Take 2 tablets (200 mg total) by mouth at bedtime.   Magnesium Oxide -  Mg Supplement 250 MG Tabs Take 250 mg by mouth every morning.   Melatonin 10 MG Tabs Take 10 mg by mouth at bedtime.   metoprolol tartrate 25 MG tablet Commonly known as: LOPRESSOR Take 0.5 tablets (12.5 mg total) by mouth 2 (two) times daily. What changed: how much to take   omeprazole 40 MG capsule Commonly known as: PRILOSEC 40 mg every morning.   oxyCODONE 5 MG immediate release tablet Commonly known as: Oxy IR/ROXICODONE Take 1 tablet (5 mg total) by mouth every 4 (four) hours as needed for severe pain.   pyridOXINE 100 MG tablet Commonly known as: VITAMIN B6 Take 100 mg by mouth as needed.   traMADol 50 MG tablet Commonly known as: ULTRAM Take 1 tablet (50 mg total) by mouth every 4 (four) hours as needed for moderate pain.   traZODone 100 MG tablet Commonly known as: DESYREL Take 1 tablet (100 mg total) by mouth at  bedtime as needed for sleep. What changed:  how much to take when to take this   Vitamin D3 25 MCG (1000 UT) Caps Take 1,000 Units by mouth daily.               Durable Medical Equipment  (From admission, onward)           Start     Ordered   04/11/22 1254  DME Walker rolling  Once       Question:  Patient needs a walker to treat with the following condition  Answer:  Total knee replacement status   04/11/22 1253   04/11/22 1254  DME Bedside commode  Once       Comments: Patient is not able to walk the distance required to go the bathroom, or he/she is unable to safely negotiate stairs required to access the bathroom.  A 3in1 BSC will alleviate this problem  Question:  Patient needs a bedside commode to treat with the following condition  Answer:  Total knee replacement status   04/11/22 1253            Disposition: Home with home health PT     Follow-up Information     Watt Climes, PA Follow up on 04/25/2022.   Specialty: Physician Assistant Why: at 10:45am Contact information: [place] [address] [phone]         Dereck Leep, MD Follow up on 05/24/2022.   Specialty: Orthopedic Surgery Why: at 12:30am Contact information: [place] [id number] [place], PA-C 04/12/2022, 5:10 PM

## 2022-04-12 NOTE — Evaluation (Signed)
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   Clinical Impression   Pt seen for OT evaluation this date, POD#1 from above surgery. PTA pt was MOD I-I in ADL/IADL. Pt currently requires minimal assist for LB dressing while in seated position due to pain and limited AROM of L knee. Pt provided education via hand out and demonstration re: polar care mgt, falls prevention strategies, home/routines modifications, DME/AE for LB bathing and dressing tasks, and compression stocking mgt. Adl mobilitly, toilet transfers completed with supervision-CGA, intermittent vcs for RW [place]. Pt would benefit from skilled OT services including additional instruction in dressing techniques with or without assistive devices for dressing and bathing skills to support recall and carryover prior to discharge and ultimately to maximize safety, independence, and minimize falls risk and caregiver burden. Do not currently anticipate any OT needs following this hospitalization.        Recommendations for follow up therapy are one component of a multi-disciplinary discharge planning process, led by the attending physician.  Recommendations may be updated based on patient status, additional functional criteria and insurance authorization.   Follow Up Recommendations  No OT follow up    Assistance Recommended at Discharge Intermittent Supervision/Assistance  Patient can return home with the following A little help with bathing/dressing/bathroom;Assist for transportation    Functional Status Assessment  Patient has had a recent decline in their functional status and demonstrates the ability to make significant improvements in function in a reasonable and predictable amount of time.  Equipment Recommendations   None recommended by OT;Other (comment) (pt has bsc and tub bench)    Recommendations for Other Services       Precautions / Restrictions Precautions Precautions: Knee Precaution Booklet Issued: Yes (comment) Restrictions Weight Bearing Restrictions: Yes LLE Weight Bearing: Weight bearing as tolerated      Mobility Bed Mobility               General bed mobility comments: NT in recliner pre/post session    Transfers Overall transfer level: Needs assistance Equipment used: Rolling walker (2 wheels) Transfers: Sit to/from Stand Sit to Stand: Supervision                  Balance Overall balance assessment: Needs assistance Sitting-balance support: Feet supported Sitting balance-Leahy Scale: Good     Standing balance support: Bilateral upper extremity supported, Reliant on assistive device for balance Standing balance-Leahy Scale: Fair                             ADL either performed or assessed with clinical judgement   ADL Overall ADL's : Needs assistance/impaired Eating/Feeding: Set up;Sitting   Grooming: Wash/dry hands;Standing;Supervision/safety               Lower Body Dressing: Minimal assistance Lower Body Dressing Details (indicate cue type and reason): educated re: use of reacher, sock aid if needed Toilet Transfer: Min guard;Regular Toilet;Rolling walker (2 wheels);Ambulation Toilet Transfer Details (indicate cue type and reason): intermittent vcs for technique, RW use Toileting- Clothing Manipulation and Hygiene: Supervision/safety;Sitting/lateral lean       Functional mobility during ADLs: Supervision/safety;Rolling walker (2 wheels);Min guard (approx 25' in room with intermittent vcs for RW use)       Vision Patient Visual  Report: No change from baseline       Perception     Praxis      Pertinent Vitals/Pain Pain Assessment Pain Assessment: Faces Faces Pain Scale: Hurts little more Pain Location: L knee Pain  Descriptors / Indicators: Discomfort Pain Intervention(s): Limited activity within patient's tolerance, Monitored during session, Premedicated before session, Repositioned, Ice applied     Hand Dominance     Extremity/Trunk Assessment Upper Extremity Assessment Upper Extremity Assessment: Overall WFL for tasks assessed   Lower Extremity Assessment Lower Extremity Assessment: LLE deficits/detail LLE Deficits / Details: s/p L TKA       Communication Communication Communication: No difficulties   Cognition Arousal/Alertness: Awake/alert Behavior During Therapy: WFL for tasks assessed/performed Overall Cognitive Status: Within Functional Limits for tasks assessed                                       General Comments  emphasis on position of L knee to promote knee ROM, including have a towel roll or bone foam placed with sitting and supine to promote knee extension    Exercises     Shoulder Instructions      Home Living Family/patient expects to be discharged to:: Private residence Living Arrangements: Spouse/significant other Available Help at Discharge: Family Type of Home: House Home Access: Level entry     Home Layout: Two level;Able to live on main level with bedroom/bathroom     Bathroom Shower/Tub: Teacher, early years/pre: Standard     Home Equipment: Conservation officer, nature (2 wheels);BSC/3in1;Tub bench          Prior Functioning/Environment Prior Level of Function : Independent/Modified Independent                        OT Problem List: Decreased activity tolerance;Impaired balance (sitting and/or standing);Decreased knowledge of use of DME or AE      OT Treatment/Interventions: Self-care/ADL training;Therapeutic exercise;Patient/family education;Balance training;Therapeutic activities;DME and/or AE instruction    OT Goals(Current goals can be found in the care plan section) Acute Rehab OT Goals Patient Stated Goal: go  home OT Goal Formulation: With patient Time For Goal Achievement: 04/26/22 Potential to Achieve Goals: Good ADL Goals Pt Will Perform Grooming: with modified independence;sitting Pt Will Perform Lower Body Dressing: with modified independence;sit to/from stand Pt Will Transfer to Toilet: with modified independence;ambulating Pt Will Perform Toileting - Clothing Manipulation and hygiene: with modified independence;sit to/from stand  OT Frequency: Min 2X/week    Co-evaluation              AM-PAC OT "6 Clicks" Daily Activity     Outcome Measure Help from another person eating meals?: None Help from another person taking care of personal grooming?: None Help from another person toileting, which includes using toliet, bedpan, or urinal?: None Help from another person bathing (including washing, rinsing, drying)?: A Little Help from another person to put on and taking off regular upper body clothing?: None Help from another person to put on and taking off regular lower body clothing?: A Little 6 Click Score: 22   End of Session Equipment Utilized During Treatment: Rolling walker (2 wheels) Nurse Communication: Mobility status  Activity Tolerance: Patient tolerated treatment well Patient left: in chair;with call bell/phone within reach  OT Visit Diagnosis: Unsteadiness on feet (R26.81);Other abnormalities of gait and mobility (R26.89)  Time: 1219-7588 OT Time Calculation (min): 27 min Charges:  OT General Charges $OT Visit: 1 Visit OT Evaluation $OT Eval Low Complexity: 1 Low  Shanon Payor, OTD OTR/L  04/12/22, 11:40 AM

## 2022-04-13 DIAGNOSIS — F4310 Post-traumatic stress disorder, unspecified: Secondary | ICD-10-CM | POA: Diagnosis not present

## 2022-04-13 DIAGNOSIS — Z471 Aftercare following joint replacement surgery: Secondary | ICD-10-CM | POA: Diagnosis not present

## 2022-04-13 DIAGNOSIS — I471 Supraventricular tachycardia, unspecified: Secondary | ICD-10-CM | POA: Diagnosis not present

## 2022-04-13 DIAGNOSIS — M5136 Other intervertebral disc degeneration, lumbar region: Secondary | ICD-10-CM | POA: Diagnosis not present

## 2022-04-13 DIAGNOSIS — K760 Fatty (change of) liver, not elsewhere classified: Secondary | ICD-10-CM | POA: Diagnosis not present

## 2022-04-13 DIAGNOSIS — K219 Gastro-esophageal reflux disease without esophagitis: Secondary | ICD-10-CM | POA: Diagnosis not present

## 2022-04-13 DIAGNOSIS — R7303 Prediabetes: Secondary | ICD-10-CM | POA: Diagnosis not present

## 2022-04-13 DIAGNOSIS — F429 Obsessive-compulsive disorder, unspecified: Secondary | ICD-10-CM | POA: Diagnosis not present

## 2022-04-13 DIAGNOSIS — Z96652 Presence of left artificial knee joint: Secondary | ICD-10-CM | POA: Diagnosis not present

## 2022-04-13 DIAGNOSIS — I10 Essential (primary) hypertension: Secondary | ICD-10-CM | POA: Diagnosis not present

## 2022-04-13 DIAGNOSIS — K5790 Diverticulosis of intestine, part unspecified, without perforation or abscess without bleeding: Secondary | ICD-10-CM | POA: Diagnosis not present

## 2022-04-13 DIAGNOSIS — K589 Irritable bowel syndrome without diarrhea: Secondary | ICD-10-CM | POA: Diagnosis not present

## 2022-04-13 DIAGNOSIS — E782 Mixed hyperlipidemia: Secondary | ICD-10-CM | POA: Diagnosis not present

## 2022-04-13 DIAGNOSIS — M8580 Other specified disorders of bone density and structure, unspecified site: Secondary | ICD-10-CM | POA: Diagnosis not present

## 2022-04-13 DIAGNOSIS — Z7901 Long term (current) use of anticoagulants: Secondary | ICD-10-CM | POA: Diagnosis not present

## 2022-04-13 DIAGNOSIS — N3941 Urge incontinence: Secondary | ICD-10-CM | POA: Diagnosis not present

## 2022-04-13 DIAGNOSIS — Z86010 Personal history of colonic polyps: Secondary | ICD-10-CM | POA: Diagnosis not present

## 2022-04-13 DIAGNOSIS — Z8673 Personal history of transient ischemic attack (TIA), and cerebral infarction without residual deficits: Secondary | ICD-10-CM | POA: Diagnosis not present

## 2022-04-13 DIAGNOSIS — G2581 Restless legs syndrome: Secondary | ICD-10-CM | POA: Diagnosis not present

## 2022-04-13 DIAGNOSIS — F332 Major depressive disorder, recurrent severe without psychotic features: Secondary | ICD-10-CM | POA: Diagnosis not present

## 2022-04-13 DIAGNOSIS — F419 Anxiety disorder, unspecified: Secondary | ICD-10-CM | POA: Diagnosis not present

## 2022-04-13 DIAGNOSIS — G4709 Other insomnia: Secondary | ICD-10-CM | POA: Diagnosis not present

## 2022-04-13 DIAGNOSIS — Z6832 Body mass index (BMI) 32.0-32.9, adult: Secondary | ICD-10-CM | POA: Diagnosis not present

## 2022-04-25 DIAGNOSIS — Z96652 Presence of left artificial knee joint: Secondary | ICD-10-CM | POA: Diagnosis not present

## 2022-04-25 DIAGNOSIS — M25562 Pain in left knee: Secondary | ICD-10-CM | POA: Diagnosis not present

## 2022-04-25 DIAGNOSIS — M25662 Stiffness of left knee, not elsewhere classified: Secondary | ICD-10-CM | POA: Diagnosis not present

## 2022-04-25 DIAGNOSIS — G8929 Other chronic pain: Secondary | ICD-10-CM | POA: Diagnosis not present

## 2022-04-25 DIAGNOSIS — M6281 Muscle weakness (generalized): Secondary | ICD-10-CM | POA: Diagnosis not present

## 2022-04-30 DIAGNOSIS — E538 Deficiency of other specified B group vitamins: Secondary | ICD-10-CM | POA: Diagnosis not present

## 2022-05-01 DIAGNOSIS — M25662 Stiffness of left knee, not elsewhere classified: Secondary | ICD-10-CM | POA: Diagnosis not present

## 2022-05-01 DIAGNOSIS — Z96652 Presence of left artificial knee joint: Secondary | ICD-10-CM | POA: Diagnosis not present

## 2022-05-01 DIAGNOSIS — G8929 Other chronic pain: Secondary | ICD-10-CM | POA: Diagnosis not present

## 2022-05-01 DIAGNOSIS — M25562 Pain in left knee: Secondary | ICD-10-CM | POA: Diagnosis not present

## 2022-05-01 DIAGNOSIS — M6281 Muscle weakness (generalized): Secondary | ICD-10-CM | POA: Diagnosis not present

## 2022-05-04 DIAGNOSIS — M25662 Stiffness of left knee, not elsewhere classified: Secondary | ICD-10-CM | POA: Diagnosis not present

## 2022-05-04 DIAGNOSIS — M25562 Pain in left knee: Secondary | ICD-10-CM | POA: Diagnosis not present

## 2022-05-04 DIAGNOSIS — Z96652 Presence of left artificial knee joint: Secondary | ICD-10-CM | POA: Diagnosis not present

## 2022-05-04 DIAGNOSIS — M6281 Muscle weakness (generalized): Secondary | ICD-10-CM | POA: Diagnosis not present

## 2022-05-04 DIAGNOSIS — G8929 Other chronic pain: Secondary | ICD-10-CM | POA: Diagnosis not present

## 2022-05-08 DIAGNOSIS — M6281 Muscle weakness (generalized): Secondary | ICD-10-CM | POA: Diagnosis not present

## 2022-05-08 DIAGNOSIS — M25662 Stiffness of left knee, not elsewhere classified: Secondary | ICD-10-CM | POA: Diagnosis not present

## 2022-05-08 DIAGNOSIS — M25562 Pain in left knee: Secondary | ICD-10-CM | POA: Diagnosis not present

## 2022-05-08 DIAGNOSIS — G8929 Other chronic pain: Secondary | ICD-10-CM | POA: Diagnosis not present

## 2022-05-08 DIAGNOSIS — R399 Unspecified symptoms and signs involving the genitourinary system: Secondary | ICD-10-CM | POA: Diagnosis not present

## 2022-05-08 DIAGNOSIS — Z96652 Presence of left artificial knee joint: Secondary | ICD-10-CM | POA: Diagnosis not present

## 2022-05-10 DIAGNOSIS — G8929 Other chronic pain: Secondary | ICD-10-CM | POA: Diagnosis not present

## 2022-05-10 DIAGNOSIS — Z96652 Presence of left artificial knee joint: Secondary | ICD-10-CM | POA: Diagnosis not present

## 2022-05-10 DIAGNOSIS — M25662 Stiffness of left knee, not elsewhere classified: Secondary | ICD-10-CM | POA: Diagnosis not present

## 2022-05-10 DIAGNOSIS — M25562 Pain in left knee: Secondary | ICD-10-CM | POA: Diagnosis not present

## 2022-05-10 DIAGNOSIS — M6281 Muscle weakness (generalized): Secondary | ICD-10-CM | POA: Diagnosis not present

## 2022-05-11 DIAGNOSIS — Z471 Aftercare following joint replacement surgery: Secondary | ICD-10-CM | POA: Diagnosis not present

## 2022-05-15 DIAGNOSIS — M6281 Muscle weakness (generalized): Secondary | ICD-10-CM | POA: Diagnosis not present

## 2022-05-15 DIAGNOSIS — G8929 Other chronic pain: Secondary | ICD-10-CM | POA: Diagnosis not present

## 2022-05-15 DIAGNOSIS — Z96652 Presence of left artificial knee joint: Secondary | ICD-10-CM | POA: Diagnosis not present

## 2022-05-15 DIAGNOSIS — M25662 Stiffness of left knee, not elsewhere classified: Secondary | ICD-10-CM | POA: Diagnosis not present

## 2022-05-15 DIAGNOSIS — M25562 Pain in left knee: Secondary | ICD-10-CM | POA: Diagnosis not present

## 2022-05-22 DIAGNOSIS — M25662 Stiffness of left knee, not elsewhere classified: Secondary | ICD-10-CM | POA: Diagnosis not present

## 2022-05-22 DIAGNOSIS — G8929 Other chronic pain: Secondary | ICD-10-CM | POA: Diagnosis not present

## 2022-05-22 DIAGNOSIS — M25562 Pain in left knee: Secondary | ICD-10-CM | POA: Diagnosis not present

## 2022-05-22 DIAGNOSIS — M6281 Muscle weakness (generalized): Secondary | ICD-10-CM | POA: Diagnosis not present

## 2022-05-22 DIAGNOSIS — Z96652 Presence of left artificial knee joint: Secondary | ICD-10-CM | POA: Diagnosis not present

## 2022-05-24 DIAGNOSIS — Z96652 Presence of left artificial knee joint: Secondary | ICD-10-CM | POA: Diagnosis not present

## 2022-05-31 DIAGNOSIS — E538 Deficiency of other specified B group vitamins: Secondary | ICD-10-CM | POA: Diagnosis not present

## 2022-06-01 DIAGNOSIS — R399 Unspecified symptoms and signs involving the genitourinary system: Secondary | ICD-10-CM | POA: Diagnosis not present

## 2022-06-30 DIAGNOSIS — J209 Acute bronchitis, unspecified: Secondary | ICD-10-CM | POA: Diagnosis not present

## 2022-06-30 DIAGNOSIS — Z03818 Encounter for observation for suspected exposure to other biological agents ruled out: Secondary | ICD-10-CM | POA: Diagnosis not present

## 2022-06-30 DIAGNOSIS — B9689 Other specified bacterial agents as the cause of diseases classified elsewhere: Secondary | ICD-10-CM | POA: Diagnosis not present

## 2022-06-30 DIAGNOSIS — J019 Acute sinusitis, unspecified: Secondary | ICD-10-CM | POA: Diagnosis not present

## 2022-07-26 DIAGNOSIS — Z03818 Encounter for observation for suspected exposure to other biological agents ruled out: Secondary | ICD-10-CM | POA: Diagnosis not present

## 2022-07-26 DIAGNOSIS — U071 COVID-19: Secondary | ICD-10-CM | POA: Diagnosis not present

## 2022-09-24 DIAGNOSIS — E782 Mixed hyperlipidemia: Secondary | ICD-10-CM | POA: Diagnosis not present

## 2022-09-24 DIAGNOSIS — E538 Deficiency of other specified B group vitamins: Secondary | ICD-10-CM | POA: Diagnosis not present

## 2022-09-24 DIAGNOSIS — R7303 Prediabetes: Secondary | ICD-10-CM | POA: Diagnosis not present

## 2022-10-01 DIAGNOSIS — I471 Supraventricular tachycardia, unspecified: Secondary | ICD-10-CM | POA: Diagnosis not present

## 2022-10-01 DIAGNOSIS — M5136 Other intervertebral disc degeneration, lumbar region: Secondary | ICD-10-CM | POA: Diagnosis not present

## 2022-10-01 DIAGNOSIS — K760 Fatty (change of) liver, not elsewhere classified: Secondary | ICD-10-CM | POA: Diagnosis not present

## 2022-10-01 DIAGNOSIS — G4709 Other insomnia: Secondary | ICD-10-CM | POA: Diagnosis not present

## 2022-10-01 DIAGNOSIS — R3129 Other microscopic hematuria: Secondary | ICD-10-CM | POA: Diagnosis not present

## 2022-10-01 DIAGNOSIS — E782 Mixed hyperlipidemia: Secondary | ICD-10-CM | POA: Diagnosis not present

## 2022-10-01 DIAGNOSIS — Z0000 Encounter for general adult medical examination without abnormal findings: Secondary | ICD-10-CM | POA: Diagnosis not present

## 2022-10-01 DIAGNOSIS — R7303 Prediabetes: Secondary | ICD-10-CM | POA: Diagnosis not present

## 2022-10-01 DIAGNOSIS — E538 Deficiency of other specified B group vitamins: Secondary | ICD-10-CM | POA: Diagnosis not present

## 2022-10-01 DIAGNOSIS — F331 Major depressive disorder, recurrent, moderate: Secondary | ICD-10-CM | POA: Diagnosis not present

## 2022-10-15 DIAGNOSIS — H43813 Vitreous degeneration, bilateral: Secondary | ICD-10-CM | POA: Diagnosis not present

## 2022-10-16 DIAGNOSIS — Z96652 Presence of left artificial knee joint: Secondary | ICD-10-CM | POA: Diagnosis not present

## 2022-10-25 ENCOUNTER — Ambulatory Visit: Payer: PPO | Admitting: Urology

## 2022-10-26 ENCOUNTER — Encounter (INDEPENDENT_AMBULATORY_CARE_PROVIDER_SITE_OTHER): Payer: PPO | Admitting: Urology

## 2022-10-26 ENCOUNTER — Encounter: Payer: Self-pay | Admitting: Urology

## 2022-10-26 VITALS — BP 120/75 | HR 89 | Ht 59.0 in | Wt 154.0 lb

## 2022-10-29 NOTE — Progress Notes (Signed)
Pt r/s appt.

## 2022-11-14 ENCOUNTER — Encounter: Payer: Self-pay | Admitting: Urology

## 2022-11-14 ENCOUNTER — Ambulatory Visit (INDEPENDENT_AMBULATORY_CARE_PROVIDER_SITE_OTHER): Payer: PPO | Admitting: Urology

## 2022-11-14 VITALS — BP 154/84 | HR 69 | Ht 59.0 in | Wt 154.0 lb

## 2022-11-14 DIAGNOSIS — R351 Nocturia: Secondary | ICD-10-CM

## 2022-11-14 DIAGNOSIS — N3941 Urge incontinence: Secondary | ICD-10-CM

## 2022-11-14 LAB — URINALYSIS, COMPLETE
Bilirubin, UA: NEGATIVE
Glucose, UA: NEGATIVE
Ketones, UA: NEGATIVE
Leukocytes,UA: NEGATIVE
Nitrite, UA: NEGATIVE
Protein,UA: NEGATIVE
Specific Gravity, UA: 1.025 (ref 1.005–1.030)
Urobilinogen, Ur: 0.2 mg/dL (ref 0.2–1.0)
pH, UA: 5.5 (ref 5.0–7.5)

## 2022-11-14 LAB — BLADDER SCAN AMB NON-IMAGING: Scan Result: 0

## 2022-11-14 LAB — MICROSCOPIC EXAMINATION: Epithelial Cells (non renal): >10 /hpf — AB (ref 0–10)

## 2022-11-14 MED ORDER — TROSPIUM CHLORIDE 20 MG PO TABS: ORAL_TABLET | ORAL | 1 refills | Status: DC

## 2022-11-14 NOTE — Progress Notes (Signed)
I, Duke Salvia, acting as a Neurosurgeon for Riki Altes, MD., have documented all relevant documentation on the behalf of Riki Altes, MD, as directed by  Riki Altes, MD while in the presence of Riki Altes, MD.  11/14/2022 4:25 PM   Joanna Mendoza 1946-05-11 [id number]  Referring provider: Lynnea Ferrier, MD [address] [facility] [place],  [place] [address]  Chief Complaint  Patient presents with   Nocturia    HPI: Joanna Mendoza is a 77 y.o. female self referred for evaluation of nocturia.  I initially saw Ms. Danielsen in early 2019 for a microhematuria referral, she did not have significant microhematuria but had bladder overactivity. Cystoscopy showed no abnormalities. She was last seen April 2021 for recurrent UTI and we discussed preventative measures. Several month history of nocturia with small volume urge incontinence. She will awaken with intense urge to go and leak a small amount before making it to the toilet. This typically occurs 3x per night. She feels her nocturnal volumes are elevated in comparison to her daytime volumes. No history of sleep apnea or snoring. No bothersome urinary symptoms. Denies dysuria, gross hematuria. No flank/abdominal/pelvic pain.   PMH: Past Medical History:  Diagnosis Date   Anxiety    Anxiety    Arthritis    Cognitive impairment    Colon polyps    DDD (degenerative disc disease), lumbar    Depression    Diverticulosis    Duodenal mass    Epigastric pain    Fatty liver    Frequent PVCs    GERD (gastroesophageal reflux disease)    History of chicken pox    Hypertension    IBS (irritable bowel syndrome)    Morbid obesity (HCC)    Nausea    OCD (obsessive compulsive disorder)    Osteopenia    Pre-diabetes    PSVT (paroxysmal supraventricular tachycardia)    Renal cyst    Restless legs    Status post rotator cuff repair    Stroke [facility])    age 64   Suicidal ideation     Surgical  History: Past Surgical History:  Procedure Laterality Date   ABDOMINAL HYSTERECTOMY     benign Left    breast mass removed   BREAST EXCISIONAL BIOPSY Left 2002   neg   BREAST SURGERY Left 2002   Breast Biopsy   CHOLECYSTECTOMY  2011   COLONOSCOPY N/A 11/27/2021   Procedure: COLONOSCOPY;  Surgeon: Regis Bill, MD;  Location: [facility] ENDOSCOPY;  Service: Endoscopy;  Laterality: N/A;   COLONOSCOPY WITH ESOPHAGOGASTRODUODENOSCOPY (EGD)     COLONOSCOPY WITH PROPOFOL N/A 08/07/2017   Procedure: COLONOSCOPY WITH PROPOFOL;  Surgeon: Scot Jun, MD;  Location: [facility] ENDOSCOPY;  Service: Endoscopy;  Laterality: N/A;   ddd     ESOPHAGOGASTRODUODENOSCOPY (EGD) WITH PROPOFOL N/A 08/07/2017   Procedure: ESOPHAGOGASTRODUODENOSCOPY (EGD) WITH PROPOFOL;  Surgeon: Scot Jun, MD;  Location: [facility] ENDOSCOPY;  Service: Endoscopy;  Laterality: N/A;   FOOT SURGERY     KNEE ARTHROPLASTY Left 04/11/2022   Procedure: COMPUTER ASSISTED TOTAL KNEE ARTHROPLASTY;  Surgeon: Donato Heinz, MD;  Location: [facility] ORS;  Service: Orthopedics;  Laterality: Left;   KNEE ARTHROSCOPY Left 2008   LAPAROSCOPIC BILATERAL SALPINGO OOPHERECTOMY     OOPHORECTOMY     SHOULDER ACROMIOPLASTY Right 04/06/2015   Procedure: SUBACROMIAL DECOMPRESSION, ROTATOR CUFF REPAIR;  Surgeon: Donato Heinz, MD;  Location: [facility] ORS;  Service: Orthopedics;  Laterality: Right;    Home Medications:  Allergies as of 11/14/2022       Reactions   Celexa [citalopram] Other (See Comments)   "unknown"   Cogentin [benztropine] Other (See Comments)   "unknown"   Flagyl [metronidazole] Other (See Comments)   "unknown"   Hydrocodone    unsure   Lorazepam Other (See Comments)   Worsening in irritability   Adhesive [tape] Rash   Ok to use paper tape        Medication List        Accurate as of November 14, 2022  4:25 PM. If you have any questions, ask your nurse or doctor.          calcium carbonate 600 MG Tabs  tablet Commonly known as: OS-CAL Take 600 mg by mouth daily.   estradiol 0.1 MG/GM vaginal cream Commonly known as: ESTRACE Place 1 Applicatorful vaginally 2 (two) times a week.   Magnesium Oxide -Mg Supplement 250 MG Tabs Take 250 mg by mouth every morning.   Melatonin 10 MG Tabs Take 10 mg by mouth at bedtime.   omeprazole 40 MG capsule Commonly known as: PRILOSEC 40 mg every morning.   traZODone 100 MG tablet Commonly known as: DESYREL Take 1 tablet (100 mg total) by mouth at bedtime as needed for sleep.   trospium 20 MG tablet Commonly known as: SANCTURA 1 hour prior to bedtime Started by: Riki Altes, MD        Allergies:  Allergies  Allergen Reactions   Celexa [Citalopram] Other (See Comments)    "unknown"   Cogentin [Benztropine] Other (See Comments)    "unknown"   Flagyl [Metronidazole] Other (See Comments)    "unknown"   Hydrocodone     unsure   Lorazepam Other (See Comments)    Worsening in irritability   Adhesive [Tape] Rash    Ok to use paper tape    Family History: Family History  Problem Relation Age of Onset   Depression Mother    Depression Sister    Kidney disease Sister    Kidney failure Sister    Depression Brother    Breast cancer Neg Hx    Kidney cancer Neg Hx    Prostate cancer Neg Hx     Social History:  reports that she has never smoked. She has never been exposed to tobacco smoke. She has never used smokeless tobacco. She reports current alcohol use. She reports that she does not use drugs.   Physical Exam: BP (!) 154/84   Pulse 69   Ht 4\' 11"  (1.499 m)   Wt 154 lb (69.9 kg)   BMI 31.10 kg/m   Constitutional:  Alert and oriented, No acute distress. HEENT: [place] AT Respiratory: Normal respiratory effort, no increased work of breathing. Psychiatric: Normal mood and affect.   Assessment & Plan:    1. Nocturia PVR today 0 mL.  Urinalysis pending. Trial tropsium 20 mg one hour prior to bedtime.  PA follow up in  one month for symptom recheck. If no improvement, consider a sleep study/further evaluation for nocturnal polyuria though treatment of nocturnal polyuria in her age group with DDAVP puts her at increased risk for hyponatremia.  I have reviewed the above documentation for accuracy and completeness, and I agree with the above.   Riki Altes, MD  [facility] [address] [place], [place] [address] [phone]

## 2022-11-15 ENCOUNTER — Encounter: Payer: Self-pay | Admitting: Urology

## 2022-12-18 ENCOUNTER — Ambulatory Visit: Payer: PPO | Admitting: Physician Assistant

## 2022-12-25 ENCOUNTER — Ambulatory Visit: Payer: PPO | Admitting: Physician Assistant

## 2022-12-26 ENCOUNTER — Ambulatory Visit (INDEPENDENT_AMBULATORY_CARE_PROVIDER_SITE_OTHER): Payer: PPO | Admitting: Physician Assistant

## 2022-12-26 VITALS — BP 124/75 | HR 81 | Ht 59.0 in | Wt 150.0 lb

## 2022-12-26 DIAGNOSIS — N3941 Urge incontinence: Secondary | ICD-10-CM

## 2022-12-26 DIAGNOSIS — R351 Nocturia: Secondary | ICD-10-CM | POA: Diagnosis not present

## 2022-12-26 LAB — BLADDER SCAN AMB NON-IMAGING: PVR: 51 WU

## 2022-12-26 MED ORDER — GEMTESA 75 MG PO TABS: 75.0000 mg | ORAL_TABLET | Freq: Every day | ORAL | 0 refills | Status: DC

## 2022-12-26 NOTE — Progress Notes (Signed)
12/26/2022 3:32 PM   Joanna Mendoza 05-Apr-1946 [id number]  CC: Chief Complaint  Patient presents with   Over Active Bladder   HPI: Joanna Mendoza is a 77 y.o. female with PMH OAB with nocturia who presents today for symptom recheck on trospium 20 mg before bed.   Today she reports  her nocturia removed on trospium, now x 2.  However, she has noticed increased urinary urgency during the day as a result.  PVR 51 mL.  PMH: Past Medical History:  Diagnosis Date   Anxiety    Anxiety    Arthritis    Cognitive impairment    Colon polyps    DDD (degenerative disc disease), lumbar    Depression    Diverticulosis    Duodenal mass    Epigastric pain    Fatty liver    Frequent PVCs    GERD (gastroesophageal reflux disease)    History of chicken pox    Hypertension    IBS (irritable bowel syndrome)    Morbid obesity (HCC)    Nausea    OCD (obsessive compulsive disorder)    Osteopenia    Pre-diabetes    PSVT (paroxysmal supraventricular tachycardia)    Renal cyst    Restless legs    Status post rotator cuff repair    Stroke [facility])    age 32   Suicidal ideation     Surgical History: Past Surgical History:  Procedure Laterality Date   ABDOMINAL HYSTERECTOMY     benign Left    breast mass removed   BREAST EXCISIONAL BIOPSY Left 2002   neg   BREAST SURGERY Left 2002   Breast Biopsy   CHOLECYSTECTOMY  2011   COLONOSCOPY N/A 11/27/2021   Procedure: COLONOSCOPY;  Surgeon: Regis Bill, MD;  Location: [facility] ENDOSCOPY;  Service: Endoscopy;  Laterality: N/A;   COLONOSCOPY WITH ESOPHAGOGASTRODUODENOSCOPY (EGD)     COLONOSCOPY WITH PROPOFOL N/A 08/07/2017   Procedure: COLONOSCOPY WITH PROPOFOL;  Surgeon: Scot Jun, MD;  Location: [facility] ENDOSCOPY;  Service: Endoscopy;  Laterality: N/A;   ddd     ESOPHAGOGASTRODUODENOSCOPY (EGD) WITH PROPOFOL N/A 08/07/2017   Procedure: ESOPHAGOGASTRODUODENOSCOPY (EGD) WITH PROPOFOL;  Surgeon: Scot Jun, MD;   Location: [facility] ENDOSCOPY;  Service: Endoscopy;  Laterality: N/A;   FOOT SURGERY     KNEE ARTHROPLASTY Left 04/11/2022   Procedure: COMPUTER ASSISTED TOTAL KNEE ARTHROPLASTY;  Surgeon: Donato Heinz, MD;  Location: [facility] ORS;  Service: Orthopedics;  Laterality: Left;   KNEE ARTHROSCOPY Left 2008   LAPAROSCOPIC BILATERAL SALPINGO OOPHERECTOMY     OOPHORECTOMY     SHOULDER ACROMIOPLASTY Right 04/06/2015   Procedure: SUBACROMIAL DECOMPRESSION, ROTATOR CUFF REPAIR;  Surgeon: Donato Heinz, MD;  Location: [facility] ORS;  Service: Orthopedics;  Laterality: Right;    Home Medications:  Allergies as of 12/26/2022       Reactions   Celexa [citalopram] Other (See Comments)   "unknown"   Cogentin [benztropine] Other (See Comments)   "unknown"   Flagyl [metronidazole] Other (See Comments)   "unknown"   Hydrocodone    unsure   Lorazepam Other (See Comments)   Worsening in irritability   Adhesive [tape] Rash   Ok to use paper tape        Medication List        Accurate as of December 26, 2022  3:32 PM. If you have any questions, ask your nurse or doctor.          STOP taking these  medications    trospium 20 MG tablet Commonly known as: SANCTURA       TAKE these medications    calcium carbonate 600 MG Tabs tablet Commonly known as: OS-CAL Take 600 mg by mouth daily.   estradiol 0.1 MG/GM vaginal cream Commonly known as: ESTRACE Place 1 Applicatorful vaginally 2 (two) times a week.   Gemtesa 75 MG Tabs Generic drug: Vibegron Take 1 tablet (75 mg total) by mouth daily.   Magnesium Oxide -Mg Supplement 250 MG Tabs Take 250 mg by mouth every morning.   Melatonin 10 MG Tabs Take 10 mg by mouth at bedtime.   omeprazole 40 MG capsule Commonly known as: PRILOSEC 40 mg every morning.   traZODone 100 MG tablet Commonly known as: DESYREL Take 1 tablet (100 mg total) by mouth at bedtime as needed for sleep.        Allergies:  Allergies  Allergen Reactions   Celexa  [Citalopram] Other (See Comments)    "unknown"   Cogentin [Benztropine] Other (See Comments)    "unknown"   Flagyl [Metronidazole] Other (See Comments)    "unknown"   Hydrocodone     unsure   Lorazepam Other (See Comments)    Worsening in irritability   Adhesive [Tape] Rash    Ok to use paper tape    Family History: Family History  Problem Relation Age of Onset   Depression Mother    Depression Sister    Kidney disease Sister    Kidney failure Sister    Depression Brother    Breast cancer Neg Hx    Kidney cancer Neg Hx    Prostate cancer Neg Hx     Social History:   reports that she has never smoked. She has never been exposed to tobacco smoke. She has never used smokeless tobacco. She reports current alcohol use. She reports that she does not use drugs.  Physical Exam: BP 124/75   Pulse 81   Ht 4\' 11"  (1.499 m)   Wt 150 lb (68 kg)   BMI 30.30 kg/m   Constitutional:  Alert and oriented, no acute distress, nontoxic appearing HEENT: [place], AT Cardiovascular: No clubbing, cyanosis, or edema Respiratory: Normal respiratory effort, no increased work of breathing Skin: No rashes, bruises or suspicious lesions Neurologic: Grossly intact, no focal deficits, moving all 4 extremities Psychiatric: Normal mood and affect  Laboratory Data: Results for orders placed or performed in visit on 12/26/22  Bladder Scan (Post Void Residual) in office  Result Value Ref Range   PVR 51.0 WU   Assessment & Plan:   1. Nocturia Improved on trospium, but now with daytime symptoms.  Will stop trospium and start Gemtesa as an alternative.  I encouraged her to take the Gemtesa before bed to help with nocturia.  If she has persistent nocturia, may combine these agents in the future. - Bladder Scan (Post Void Residual) in office - Vibegron (GEMTESA) 75 MG TABS; Take 1 tablet (75 mg total) by mouth daily.  Dispense: 42 tablet; Refill: 0  2. Urge incontinence Will switch from nighttime IR  trospium to nighttime Gemtesa as above. - Bladder Scan (Post Void Residual) in office - Vibegron (GEMTESA) 75 MG TABS; Take 1 tablet (75 mg total) by mouth daily.  Dispense: 42 tablet; Refill: 0  Return in about 6 weeks (around 02/06/2023) for Symptom recheck with PVR.  Carman Ching, PA-C  [facility] [place] [address], [address] [place], [place] [address] [phone]

## 2023-01-15 ENCOUNTER — Other Ambulatory Visit: Payer: Self-pay | Admitting: Obstetrics and Gynecology

## 2023-01-15 DIAGNOSIS — Z124 Encounter for screening for malignant neoplasm of cervix: Secondary | ICD-10-CM | POA: Diagnosis not present

## 2023-01-15 DIAGNOSIS — Z1331 Encounter for screening for depression: Secondary | ICD-10-CM | POA: Diagnosis not present

## 2023-01-15 DIAGNOSIS — Z1231 Encounter for screening mammogram for malignant neoplasm of breast: Secondary | ICD-10-CM | POA: Diagnosis not present

## 2023-01-31 ENCOUNTER — Ambulatory Visit
Admission: RE | Admit: 2023-01-31 | Discharge: 2023-01-31 | Disposition: A | Discharge status: Home / Self Care | Payer: PPO | Source: Ambulatory Visit | Attending: Obstetrics and Gynecology | Admitting: Obstetrics and Gynecology

## 2023-01-31 DIAGNOSIS — Z1231 Encounter for screening mammogram for malignant neoplasm of breast: Secondary | ICD-10-CM | POA: Insufficient documentation

## 2023-02-06 ENCOUNTER — Ambulatory Visit: Payer: PPO | Admitting: Physician Assistant

## 2023-02-20 ENCOUNTER — Ambulatory Visit: Payer: PPO | Admitting: Physician Assistant

## 2023-03-14 DIAGNOSIS — M25512 Pain in left shoulder: Secondary | ICD-10-CM | POA: Diagnosis not present

## 2023-03-14 DIAGNOSIS — M5032 Other cervical disc degeneration, mid-cervical region, unspecified level: Secondary | ICD-10-CM | POA: Diagnosis not present

## 2023-03-14 DIAGNOSIS — M47812 Spondylosis without myelopathy or radiculopathy, cervical region: Secondary | ICD-10-CM | POA: Diagnosis not present

## 2023-03-19 ENCOUNTER — Encounter: Payer: Self-pay | Admitting: Physician Assistant

## 2023-03-19 ENCOUNTER — Ambulatory Visit: Payer: PPO | Admitting: Physician Assistant

## 2023-03-19 VITALS — BP 130/74 | HR 80 | Ht 59.0 in | Wt 160.0 lb

## 2023-03-19 DIAGNOSIS — N3941 Urge incontinence: Secondary | ICD-10-CM

## 2023-03-19 DIAGNOSIS — R351 Nocturia: Secondary | ICD-10-CM | POA: Diagnosis not present

## 2023-03-19 LAB — BLADDER SCAN AMB NON-IMAGING: Scan Result: 0

## 2023-03-19 MED ORDER — TROSPIUM CHLORIDE ER 60 MG PO CP24: 1.0000 | ORAL_CAPSULE | Freq: Every day | ORAL | 11 refills | Status: AC

## 2023-03-19 NOTE — Progress Notes (Signed)
03/19/2023 3:49 PM   Joanna Mendoza [birth year] [id number]  CC: Chief Complaint  Patient presents with   Other   HPI: Joanna Mendoza is a 77 y.o. female with PMH OAB with nocturia using on trospium 20 mg before bed who presents today for symptom recheck on Gemtesa.   Today she reports no symptomatic improvement on Gemtesa.  She describes urgency and urge incontinence.  She does not remember if trospium helped more by comparison.  PVR 0mL.  PMH: Past Medical History:  Diagnosis Date   Anxiety    Anxiety    Arthritis    Cognitive impairment    Colon polyps    DDD (degenerative disc disease), lumbar    Depression    Diverticulosis    Duodenal mass    Epigastric pain    Fatty liver    Frequent PVCs    GERD (gastroesophageal reflux disease)    History of chicken pox    Hypertension    IBS (irritable bowel syndrome)    Morbid obesity (HCC)    Nausea    OCD (obsessive compulsive disorder)    Osteopenia    Pre-diabetes    PSVT (paroxysmal supraventricular tachycardia) (HCC)    Renal cyst    Restless legs    Status post rotator cuff repair    Stroke [facility])    age 83   Suicidal ideation     Surgical History: Past Surgical History:  Procedure Laterality Date   ABDOMINAL HYSTERECTOMY     benign Left    breast mass removed   BREAST EXCISIONAL BIOPSY Left 2002   neg   BREAST SURGERY Left 2002   Breast Biopsy   CHOLECYSTECTOMY  2011   COLONOSCOPY N/A 11/27/2021   Procedure: COLONOSCOPY;  Surgeon: Regis Bill, MD;  Location: [facility] ENDOSCOPY;  Service: Endoscopy;  Laterality: N/A;   COLONOSCOPY WITH ESOPHAGOGASTRODUODENOSCOPY (EGD)     COLONOSCOPY WITH PROPOFOL N/A 08/07/2017   Procedure: COLONOSCOPY WITH PROPOFOL;  Surgeon: Scot Jun, MD;  Location: [facility] ENDOSCOPY;  Service: Endoscopy;  Laterality: N/A;   ddd     ESOPHAGOGASTRODUODENOSCOPY (EGD) WITH PROPOFOL N/A 08/07/2017   Procedure: ESOPHAGOGASTRODUODENOSCOPY (EGD) WITH PROPOFOL;  Surgeon:  Scot Jun, MD;  Location: [facility] ENDOSCOPY;  Service: Endoscopy;  Laterality: N/A;   FOOT SURGERY     KNEE ARTHROPLASTY Left 04/11/2022   Procedure: COMPUTER ASSISTED TOTAL KNEE ARTHROPLASTY;  Surgeon: Donato Heinz, MD;  Location: [facility] ORS;  Service: Orthopedics;  Laterality: Left;   KNEE ARTHROSCOPY Left 2008   LAPAROSCOPIC BILATERAL SALPINGO OOPHERECTOMY     OOPHORECTOMY     SHOULDER ACROMIOPLASTY Right 04/06/2015   Procedure: SUBACROMIAL DECOMPRESSION, ROTATOR CUFF REPAIR;  Surgeon: Donato Heinz, MD;  Location: [facility] ORS;  Service: Orthopedics;  Laterality: Right;    Home Medications:  Allergies as of 03/19/2023       Reactions   Celexa [citalopram] Other (See Comments)   "unknown"   Cogentin [benztropine] Other (See Comments)   "unknown"   Flagyl [metronidazole] Other (See Comments)   "unknown"   Hydrocodone    unsure   Lorazepam Other (See Comments)   Worsening in irritability   Adhesive [tape] Rash   Ok to use paper tape        Medication List        Accurate as of March 19, 2023  3:49 PM. If you have any questions, ask your nurse or doctor.          STOP taking  these medications    Gemtesa 75 MG Tabs Generic drug: Vibegron Stopped by: Carman Ching       TAKE these medications    calcium carbonate 600 MG Tabs tablet Commonly known as: OS-CAL Take 600 mg by mouth daily.   estradiol 0.1 MG/GM vaginal cream Commonly known as: ESTRACE Place 1 Applicatorful vaginally 2 (two) times a week.   Magnesium Oxide -Mg Supplement 250 MG Tabs Take 250 mg by mouth every morning.   Melatonin 10 MG Tabs Take 10 mg by mouth at bedtime.   omeprazole 40 MG capsule Commonly known as: PRILOSEC 40 mg every morning.   traZODone 100 MG tablet Commonly known as: DESYREL Take 1 tablet (100 mg total) by mouth at bedtime as needed for sleep.   Trospium Chloride 60 MG Cp24 Take 1 capsule (60 mg total) by mouth daily. Started by: Carman Ching        Allergies:  Allergies  Allergen Reactions   Celexa [Citalopram] Other (See Comments)    "unknown"   Cogentin [Benztropine] Other (See Comments)    "unknown"   Flagyl [Metronidazole] Other (See Comments)    "unknown"   Hydrocodone     unsure   Lorazepam Other (See Comments)    Worsening in irritability   Adhesive [Tape] Rash    Ok to use paper tape    Family History: Family History  Problem Relation Age of Onset   Depression Mother    Depression Sister    Kidney disease Sister    Kidney failure Sister    Depression Brother    Breast cancer Neg Hx    Kidney cancer Neg Hx    Prostate cancer Neg Hx     Social History:   reports that she has never smoked. She has never been exposed to tobacco smoke. She has never used smokeless tobacco. She reports current alcohol use. She reports that she does not use drugs.  Physical Exam: BP 130/74   Pulse 80   Ht 4\' 11"  (1.499 m)   Wt 160 lb (72.6 kg)   BMI 32.32 kg/m   Constitutional:  Alert and oriented, no acute distress, nontoxic appearing HEENT: [place], AT Cardiovascular: No clubbing, cyanosis, or edema Respiratory: Normal respiratory effort, no increased work of breathing Skin: No rashes, bruises or suspicious lesions Neurologic: Grossly intact, no focal deficits, moving all 4 extremities Psychiatric: Normal mood and affect  Laboratory Data: Results for orders placed or performed in visit on 03/19/23  Bladder Scan (Post Void Residual) in office  Result Value Ref Range   Scan Result 0    Assessment & Plan:   1. Nocturia No improvement on Gemtesa, will discontinue this.  She previously reported some improvement on Gemtesa, so we will plan to resume this.  We discussed augmenting with third line therapies including PTNS, intravesical Botox, and InterStim.  She is unsure if she would like to pursue these.  I gave her pamphlets today to review at her convenience and she will contact us if she wishes to  proceed. - Bladder Scan (Post Void Residual) in office - Trospium Chloride 60 MG CP24; Take 1 capsule (60 mg total) by mouth daily.  Dispense: 30 capsule; Refill: 11  Return for Patient to call with desired next steps.  Carman Ching, PA-C  [facility] [place] [address], [address] [place], [place] [address] [phone]

## 2023-03-19 NOTE — Patient Instructions (Signed)
The three alternative treatment options we discussed today are: PTNS, the needle in the ankle treatment that would require weekly 30-minute treatments over 12 weeks (pamphlet provided today) Botox injections in the bladder (info at the back of this packet) Interstim, the bladder "pacemaker" device we'd implant surgically in your buttock (info at the back of this packet)

## 2023-03-22 DIAGNOSIS — M67912 Unspecified disorder of synovium and tendon, left shoulder: Secondary | ICD-10-CM | POA: Diagnosis not present

## 2023-03-26 DIAGNOSIS — R3129 Other microscopic hematuria: Secondary | ICD-10-CM | POA: Diagnosis not present

## 2023-03-26 DIAGNOSIS — E538 Deficiency of other specified B group vitamins: Secondary | ICD-10-CM | POA: Diagnosis not present

## 2023-03-26 DIAGNOSIS — R7303 Prediabetes: Secondary | ICD-10-CM | POA: Diagnosis not present

## 2023-03-26 DIAGNOSIS — E782 Mixed hyperlipidemia: Secondary | ICD-10-CM | POA: Diagnosis not present

## 2023-03-26 DIAGNOSIS — M51369 Other intervertebral disc degeneration, lumbar region without mention of lumbar back pain or lower extremity pain: Secondary | ICD-10-CM | POA: Diagnosis not present

## 2023-04-03 DIAGNOSIS — M67912 Unspecified disorder of synovium and tendon, left shoulder: Secondary | ICD-10-CM | POA: Diagnosis not present

## 2023-04-03 DIAGNOSIS — M25512 Pain in left shoulder: Secondary | ICD-10-CM | POA: Diagnosis not present

## 2023-04-05 DIAGNOSIS — E782 Mixed hyperlipidemia: Secondary | ICD-10-CM | POA: Diagnosis not present

## 2023-04-05 DIAGNOSIS — R3129 Other microscopic hematuria: Secondary | ICD-10-CM | POA: Diagnosis not present

## 2023-04-05 DIAGNOSIS — M51362 Other intervertebral disc degeneration, lumbar region with discogenic back pain and lower extremity pain: Secondary | ICD-10-CM | POA: Diagnosis not present

## 2023-04-05 DIAGNOSIS — I471 Supraventricular tachycardia, unspecified: Secondary | ICD-10-CM | POA: Diagnosis not present

## 2023-04-05 DIAGNOSIS — E538 Deficiency of other specified B group vitamins: Secondary | ICD-10-CM | POA: Diagnosis not present

## 2023-04-05 DIAGNOSIS — R7303 Prediabetes: Secondary | ICD-10-CM | POA: Diagnosis not present

## 2023-04-05 DIAGNOSIS — F331 Major depressive disorder, recurrent, moderate: Secondary | ICD-10-CM | POA: Diagnosis not present

## 2023-04-05 DIAGNOSIS — K760 Fatty (change of) liver, not elsewhere classified: Secondary | ICD-10-CM | POA: Diagnosis not present

## 2023-04-05 DIAGNOSIS — Z23 Encounter for immunization: Secondary | ICD-10-CM | POA: Diagnosis not present

## 2023-04-09 ENCOUNTER — Other Ambulatory Visit: Payer: Self-pay | Admitting: Student

## 2023-04-09 DIAGNOSIS — M25512 Pain in left shoulder: Secondary | ICD-10-CM

## 2023-04-20 ENCOUNTER — Ambulatory Visit
Admission: RE | Admit: 2023-04-20 | Discharge: 2023-04-20 | Disposition: A | Discharge status: Home / Self Care | Payer: PPO | Source: Ambulatory Visit | Attending: Student | Admitting: Student

## 2023-04-20 DIAGNOSIS — R600 Localized edema: Secondary | ICD-10-CM | POA: Diagnosis not present

## 2023-04-20 DIAGNOSIS — M25512 Pain in left shoulder: Secondary | ICD-10-CM

## 2023-04-20 DIAGNOSIS — S46012A Strain of muscle(s) and tendon(s) of the rotator cuff of left shoulder, initial encounter: Secondary | ICD-10-CM | POA: Diagnosis not present

## 2023-04-20 DIAGNOSIS — M19012 Primary osteoarthritis, left shoulder: Secondary | ICD-10-CM | POA: Diagnosis not present

## 2023-04-22 DIAGNOSIS — S46012D Strain of muscle(s) and tendon(s) of the rotator cuff of left shoulder, subsequent encounter: Secondary | ICD-10-CM | POA: Diagnosis not present

## 2023-04-22 DIAGNOSIS — M25512 Pain in left shoulder: Secondary | ICD-10-CM | POA: Diagnosis not present

## 2023-04-22 DIAGNOSIS — M25612 Stiffness of left shoulder, not elsewhere classified: Secondary | ICD-10-CM | POA: Diagnosis not present

## 2023-04-29 DIAGNOSIS — S46012D Strain of muscle(s) and tendon(s) of the rotator cuff of left shoulder, subsequent encounter: Secondary | ICD-10-CM | POA: Diagnosis not present

## 2023-05-06 DIAGNOSIS — S46012D Strain of muscle(s) and tendon(s) of the rotator cuff of left shoulder, subsequent encounter: Secondary | ICD-10-CM | POA: Diagnosis not present

## 2023-05-08 DIAGNOSIS — S46012D Strain of muscle(s) and tendon(s) of the rotator cuff of left shoulder, subsequent encounter: Secondary | ICD-10-CM | POA: Diagnosis not present

## 2023-05-13 DIAGNOSIS — S46012D Strain of muscle(s) and tendon(s) of the rotator cuff of left shoulder, subsequent encounter: Secondary | ICD-10-CM | POA: Diagnosis not present

## 2023-05-17 DIAGNOSIS — S46012D Strain of muscle(s) and tendon(s) of the rotator cuff of left shoulder, subsequent encounter: Secondary | ICD-10-CM | POA: Diagnosis not present

## 2023-05-21 DIAGNOSIS — M7052 Other bursitis of knee, left knee: Secondary | ICD-10-CM | POA: Diagnosis not present

## 2023-05-21 DIAGNOSIS — S46012D Strain of muscle(s) and tendon(s) of the rotator cuff of left shoulder, subsequent encounter: Secondary | ICD-10-CM | POA: Diagnosis not present

## 2023-05-21 DIAGNOSIS — Z96652 Presence of left artificial knee joint: Secondary | ICD-10-CM | POA: Diagnosis not present

## 2023-05-21 DIAGNOSIS — M7050 Other bursitis of knee, unspecified knee: Secondary | ICD-10-CM | POA: Diagnosis not present

## 2023-05-23 DIAGNOSIS — M75122 Complete rotator cuff tear or rupture of left shoulder, not specified as traumatic: Secondary | ICD-10-CM | POA: Diagnosis not present

## 2023-05-27 ENCOUNTER — Other Ambulatory Visit: Payer: Self-pay | Admitting: Psychiatry

## 2023-05-27 DIAGNOSIS — M75122 Complete rotator cuff tear or rupture of left shoulder, not specified as traumatic: Secondary | ICD-10-CM

## 2023-06-04 DIAGNOSIS — S46012D Strain of muscle(s) and tendon(s) of the rotator cuff of left shoulder, subsequent encounter: Secondary | ICD-10-CM | POA: Diagnosis not present

## 2023-06-07 ENCOUNTER — Ambulatory Visit
Admission: RE | Admit: 2023-06-07 | Discharge: 2023-06-07 | Disposition: A | Discharge status: Home / Self Care | Payer: PPO | Source: Ambulatory Visit | Attending: Psychiatry | Admitting: Psychiatry

## 2023-06-07 DIAGNOSIS — M19012 Primary osteoarthritis, left shoulder: Secondary | ICD-10-CM | POA: Diagnosis not present

## 2023-06-07 DIAGNOSIS — M25512 Pain in left shoulder: Secondary | ICD-10-CM | POA: Diagnosis not present

## 2023-06-07 DIAGNOSIS — S46012A Strain of muscle(s) and tendon(s) of the rotator cuff of left shoulder, initial encounter: Secondary | ICD-10-CM | POA: Diagnosis not present

## 2023-06-07 DIAGNOSIS — M75122 Complete rotator cuff tear or rupture of left shoulder, not specified as traumatic: Secondary | ICD-10-CM

## 2023-06-24 ENCOUNTER — Other Ambulatory Visit: Payer: Self-pay | Admitting: Orthopedic Surgery

## 2023-06-26 DIAGNOSIS — M75122 Complete rotator cuff tear or rupture of left shoulder, not specified as traumatic: Secondary | ICD-10-CM | POA: Diagnosis not present

## 2023-06-27 ENCOUNTER — Encounter: Payer: Self-pay | Admitting: Urgent Care

## 2023-07-01 ENCOUNTER — Inpatient Hospital Stay
Admission: RE | Admit: 2023-07-01 | Discharge: 2023-07-01 | Disposition: A | Discharge status: Home / Self Care | Payer: PPO | Source: Ambulatory Visit

## 2023-07-01 NOTE — Patient Instructions (Signed)
Your procedure is scheduled on: Tuesday 07/09/23 To find out your arrival time, please call [phone] between 1PM - 3PM on:  Monday 07/08/23  Report to the Registration Desk on the 1st floor of the [facility]. Free Valet parking is available.  If your arrival time is 6:00 am, do not arrive before that time as the [facility] entrance doors do not open until 6:00 am.  REMEMBER: Instructions that are not followed completely may result in serious medical risk, up to and including death; or upon the discretion of your surgeon and anesthesiologist your surgery may need to be rescheduled.  Do not eat food after midnight the night before surgery.  No gum chewing or hard candies.  You may however, drink CLEAR liquids up to 2 hours before you are scheduled to arrive for your surgery. Do not drink anything within 2 hours of your scheduled arrival time.  Clear liquids include: - water  - apple juice without pulp - gatorade (not RED colors) - black coffee or tea (Do NOT add milk or creamers to the coffee or tea) Do NOT drink anything that is not on this list.  Type 1 and Type 2 diabetics should only drink water.  In addition, your doctor has ordered for you to drink the provided:  Ensure Pre-Surgery Clear Carbohydrate Drink  Gatorade G2 Drinking this carbohydrate drink up to two hours before surgery helps to reduce insulin resistance and improve patient outcomes. Please complete drinking 2 hours before scheduled arrival time.  One week prior to surgery: Stop Anti-inflammatories (NSAIDS) such as Advil, Aleve, Ibuprofen, Motrin, Naproxen, Naprosyn and Aspirin based products such as Excedrin, Goody's Powder, BC Powder. You may however, continue to take Tylenol if needed for pain up until the day of surgery.  Stop ANY OVER THE COUNTER supplements until after surgery.  Continue taking all prescribed medications with the exception of the following:  **Follow guidelines for insulin and diabetes  medications**  Follow recommendations from Cardiologist or PCP regarding stopping blood thinners.  TAKE ONLY THESE MEDICATIONS THE MORNING OF SURGERY WITH A SIP OF WATER:    Antacid (take one the night before and one on the morning of surgery - helps to prevent nausea after surgery.)  Use inhalers on the day of surgery and bring to the hospital.  Fleets enema or bowel prep as directed.  No Alcohol for 24 hours before or after surgery.  No Smoking including e-cigarettes for 24 hours before surgery.  No chewable tobacco products for at least 6 hours before surgery.  No nicotine patches on the day of surgery.  Do not use any "recreational" drugs for at least a week (preferably 2 weeks) before your surgery.  Please be advised that the combination of cocaine and anesthesia may have negative outcomes, up to and including death. If you test positive for cocaine, your surgery will be cancelled.  On the morning of surgery brush your teeth with toothpaste and water, you may rinse your mouth with mouthwash if you wish. Do not swallow any toothpaste or mouthwash.  Use CHG Soap or wipes as directed on instruction sheet.  Do not wear lotions, powders, or perfumes.   Do not shave body hair from the neck down 48 hours before surgery.  Wear comfortable clothing (specific to your surgery type) to the hospital.  Do not wear jewelry, make-up, hairpins, clips or nail polish.  For welded (permanent) jewelry: bracelets, anklets, waist bands, etc.  Please have this removed prior to surgery.  If it is not removed, there is a chance that hospital personnel will need to cut it off on the day of surgery. Contact lenses, hearing aids and dentures may not be worn into surgery.  Bring your C-PAP to the hospital in case you may have to spend the night.   Do not bring valuables to the hospital. [facility] is not responsible for any missing/lost belongings or valuables.   Total Shoulder Arthroplasty:  use  Benzoyl Peroxide 5% Gel as directed on instruction sheet.  Notify your doctor if there is any change in your medical condition (cold, fever, infection).  If you are being discharged the day of surgery, you will not be allowed to drive home. You will need a responsible individual to drive you home and stay with you for 24 hours after surgery.   If you are taking public transportation, you will need to have a responsible individual with you.  If you are being admitted to the hospital overnight, leave your suitcase in the car. After surgery it may be brought to your room.  In case of increased patient census, it may be necessary for you, the patient, to continue your postoperative care in the Same Day Surgery department.  After surgery, you can help prevent lung complications by doing breathing exercises.  Take deep breaths and cough every 1-2 hours. Your doctor may order a device called an Incentive Spirometer to help you take deep breaths. When coughing or sneezing, hold a pillow firmly against your incision with both hands. This is called "splinting." Doing this helps protect your incision. It also decreases belly discomfort.  Surgery Visitation Policy:  Patients undergoing a surgery or procedure may have two family members or support persons with them as long as the person is not COVID-19 positive or experiencing its symptoms.   Inpatient Visitation:    Visiting hours are 7 a.m. to 8 p.m. Up to four visitors are allowed at one time in a patient room. The visitors may rotate out with other people during the day. One designated support person (adult) may remain overnight.  Due to an increase in RSV and influenza rates and associated hospitalizations, children ages 11 and under will not be able to visit patients in [facility]. Masks continue to be strongly recommended.  Please call the Pre-admissions Testing Dept. at [phone] if you have any questions about these  instructions.    Pre-operative 5 CHG Bath Instructions   You can play a key role in reducing the risk of infection after surgery. Your skin needs to be as free of germs as possible. You can reduce the number of germs on your skin by washing with CHG (chlorhexidine gluconate) soap before surgery. CHG is an antiseptic soap that kills germs and continues to kill germs even after washing.   DO NOT use if you have an allergy to chlorhexidine/CHG or antibacterial soaps. If your skin becomes reddened or irritated, stop using the CHG and notify one of our RNs at [phone].   Please shower with the CHG soap starting 4 days before surgery using the following schedule:   Friday 07/05/23 - Tuesday 07/09/23    Please keep in mind the following:  DO NOT shave, including legs and underarms, starting the day of your first shower.   You may shave your face at any point before/day of surgery.  Place clean sheets on your bed the day you start using CHG soap. Use a clean washcloth (not used since being washed)  for each shower. DO NOT sleep with pets once you start using the CHG.   CHG Shower Instructions:  If you choose to wash your hair and private area, wash first with your normal shampoo/soap.  After you use shampoo/soap, rinse your hair and body thoroughly to remove shampoo/soap residue.  Turn the water OFF and apply about 3 tablespoons (45 ml) of CHG soap to a CLEAN washcloth.  Apply CHG soap ONLY FROM YOUR NECK DOWN TO YOUR TOES (washing for 3-5 minutes)  DO NOT use CHG soap on face, private areas, open wounds, or sores.  Pay special attention to the area where your surgery is being performed.  If you are having back surgery, having someone wash your back for you may be helpful. Wait 2 minutes after CHG soap is applied, then you may rinse off the CHG soap.  Pat dry with a clean towel  Put on clean clothes/pajamas   If you choose to wear lotion, please use ONLY the CHG-compatible lotions on the  back of this paper.     Additional instructions for the day of surgery: DO NOT APPLY any lotions, deodorants, cologne, or perfumes.   Put on clean/comfortable clothes.  Brush your teeth.  Ask your nurse before applying any prescription medications to the skin.      CHG Compatible Lotions   Aveeno Moisturizing lotion  Cetaphil Moisturizing Cream  Cetaphil Moisturizing Lotion  Clairol Herbal Essence Moisturizing Lotion, Dry Skin  Clairol Herbal Essence Moisturizing Lotion, Extra Dry Skin  Clairol Herbal Essence Moisturizing Lotion, Normal Skin  Curel Age Defying Therapeutic Moisturizing Lotion with Alpha Hydroxy  Curel Extreme Care Body Lotion  Curel Soothing Hands Moisturizing Hand Lotion  Curel Therapeutic Moisturizing Cream, Fragrance-Free  Curel Therapeutic Moisturizing Lotion, Fragrance-Free  Curel Therapeutic Moisturizing Lotion, Original Formula  Eucerin Daily Replenishing Lotion  Eucerin Dry Skin Therapy Plus Alpha Hydroxy Crme  Eucerin Dry Skin Therapy Plus Alpha Hydroxy Lotion  Eucerin Original Crme  Eucerin Original Lotion  Eucerin Plus Crme Eucerin Plus Lotion  Eucerin TriLipid Replenishing Lotion  Keri Anti-Bacterial Hand Lotion  Keri Deep Conditioning Original Lotion Dry Skin Formula Softly Scented  Keri Deep Conditioning Original Lotion, Fragrance Free Sensitive Skin Formula  Keri Lotion Fast Absorbing Fragrance Free Sensitive Skin Formula  Keri Lotion Fast Absorbing Softly Scented Dry Skin Formula  Keri Original Lotion  Keri Skin Renewal Lotion Keri Silky Smooth Lotion  Keri Silky Smooth Sensitive Skin Lotion  Nivea Body Creamy Conditioning Oil  Nivea Body Extra Enriched Lotion  Nivea Body Original Lotion  Nivea Body Sheer Moisturizing Lotion Nivea Crme  Nivea Skin Firming Lotion  NutraDerm 30 Skin Lotion  NutraDerm Skin Lotion  NutraDerm Therapeutic Skin Cream  NutraDerm Therapeutic Skin Lotion  ProShield Protective Hand Cream  Provon moisturizing  lotion  Preparing for Total Shoulder Arthroplasty  Before surgery, you can play an important role by reducing the number of germs on your skin by using the following products:  Benzoyl Peroxide Gel  o Reduces the number of germs present on the skin  o Applied twice a day to shoulder area starting two days before surgery  Chlorhexidine Gluconate (CHG) Soap  o An antiseptic cleaner that kills germs and bonds with the skin to continue killing germs even after washing  o Used for showering the night before surgery and morning of surgery  BENZOYL PEROXIDE 5% GEL  Please do not use if you have an allergy to benzoyl peroxide. If your skin becomes reddened/irritated stop using the  benzoyl peroxide.  Starting two days before surgery, apply as follows:  1. Apply benzoyl peroxide in the morning and at night. Apply after taking a shower. If you are not taking a shower, clean entire shoulder front, back, and side along with the armpit with a clean wet washcloth.  2. Place a quarter-sized dollop on your shoulder and rub in thoroughly, making sure to cover the front, back, and side of your shoulder, along with the armpit.  2 days before ____ AM ____  PM Sunday 07/07/23   1 day before ____ AM ____ PM Monday 07/08/23  3. Do this twice a day for two days. (Last application is the night before surgery, AFTER using the CHG soap).  4. Do NOT apply benzoyl peroxide gel on the day of surgery.

## 2023-07-09 ENCOUNTER — Ambulatory Visit: Admit: 2023-07-09 | Payer: PPO | Admitting: Orthopedic Surgery

## 2023-07-09 SURGERY — REVERSE SHOULDER ARTHROPLASTY
Anesthesia: Choice | Site: Shoulder | Laterality: Left

## 2023-07-11 DIAGNOSIS — R42 Dizziness and giddiness: Secondary | ICD-10-CM | POA: Diagnosis not present

## 2023-07-11 DIAGNOSIS — R5383 Other fatigue: Secondary | ICD-10-CM | POA: Diagnosis not present

## 2023-07-25 IMAGING — MG MM DIGITAL SCREENING BILAT W/ TOMO AND CAD
8 series · 8 of 24 positions shown · non-contrast
Comparison: Previous exam(s).

CLINICAL DATA: Screening.

EXAM:
DIGITAL SCREENING BILATERAL MAMMOGRAM WITH TOMOSYNTHESIS AND CAD
TECHNIQUE: Bilateral screening digital craniocaudal and mediolateral oblique
mammograms were obtained. Bilateral screening digital breast
tomosynthesis was performed. The images were evaluated with
computer-aided detection.

[R CC synth-2D]
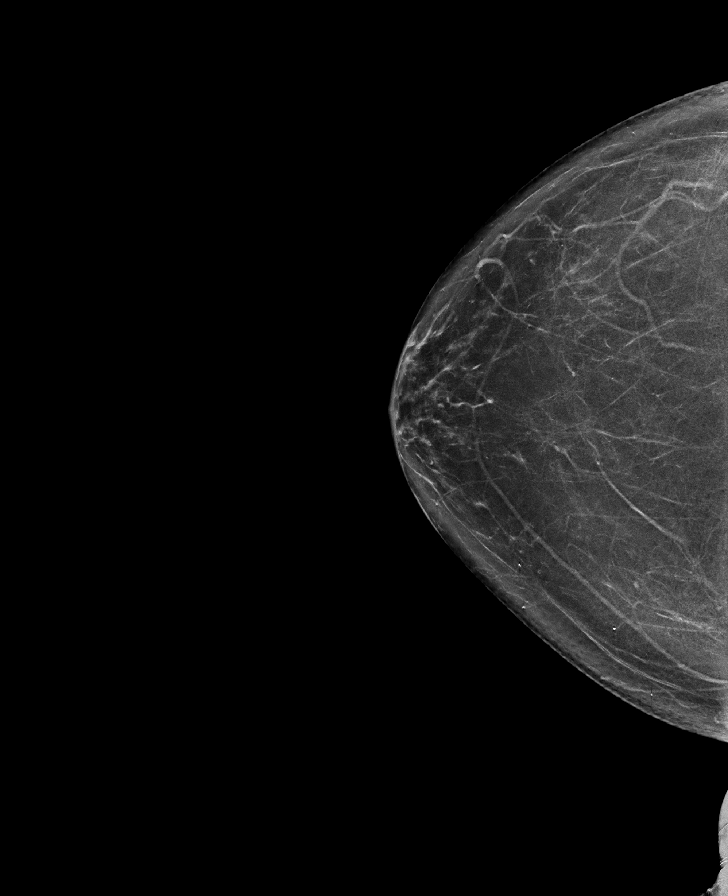

[L CC synth-2D]
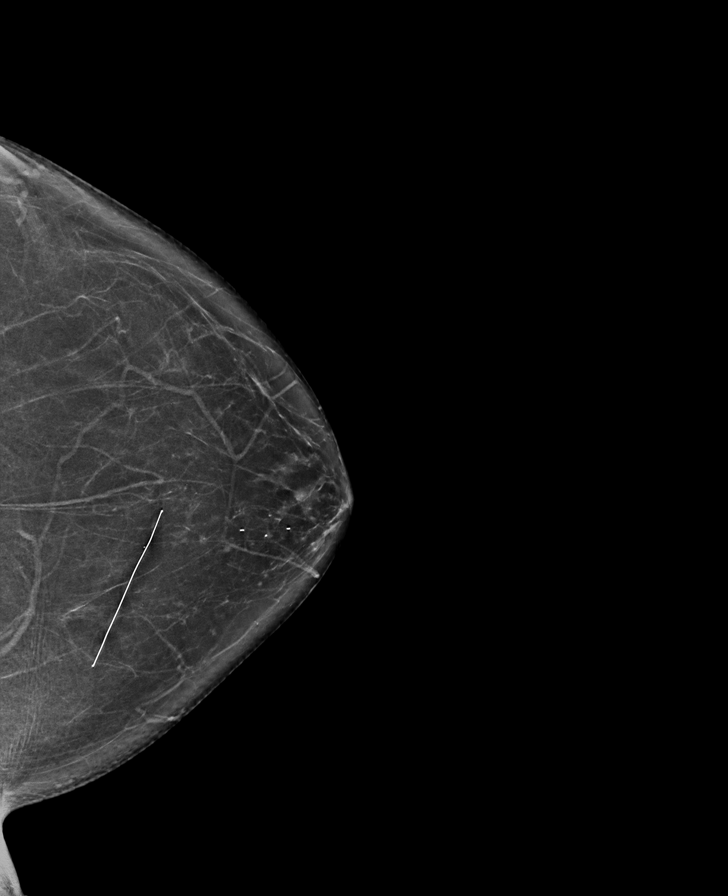

[R MLO synth-2D]
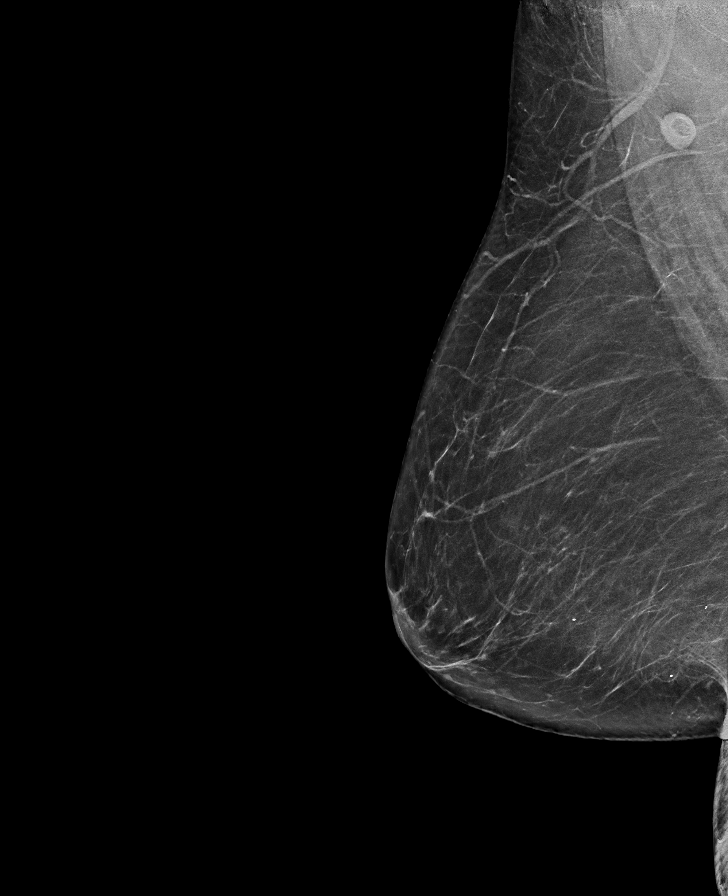

[L MLO synth-2D]
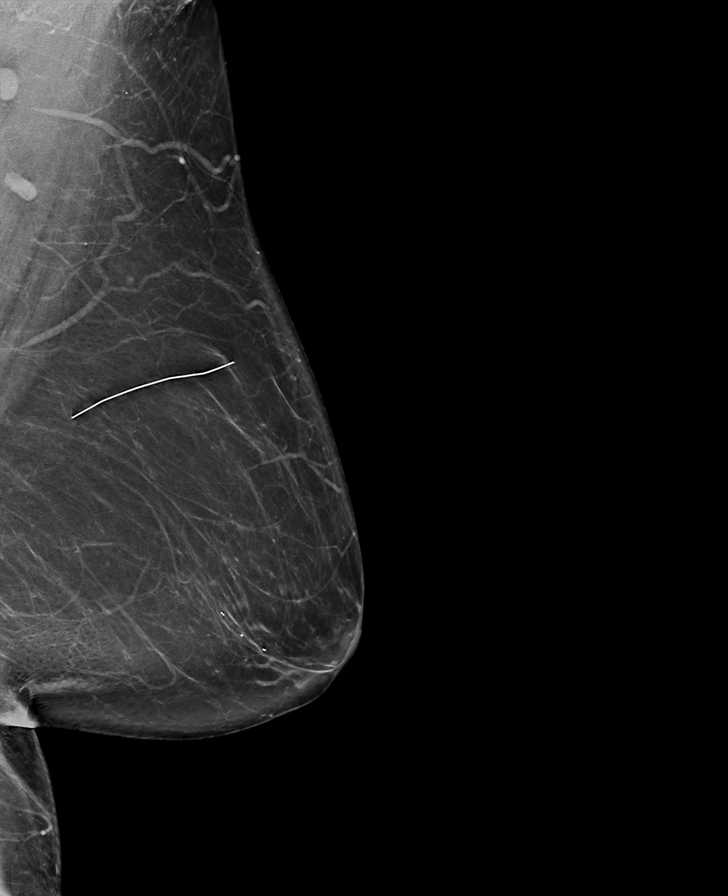

[L MLO tomo · tomo slice 39/76.0]
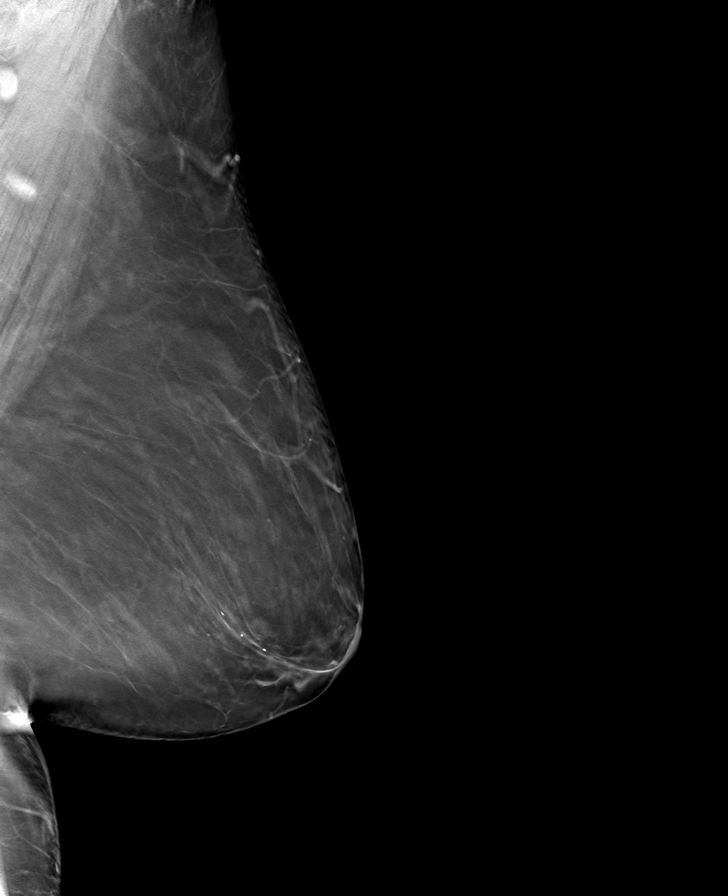

[R CC tomo · tomo slice 35/69.0]
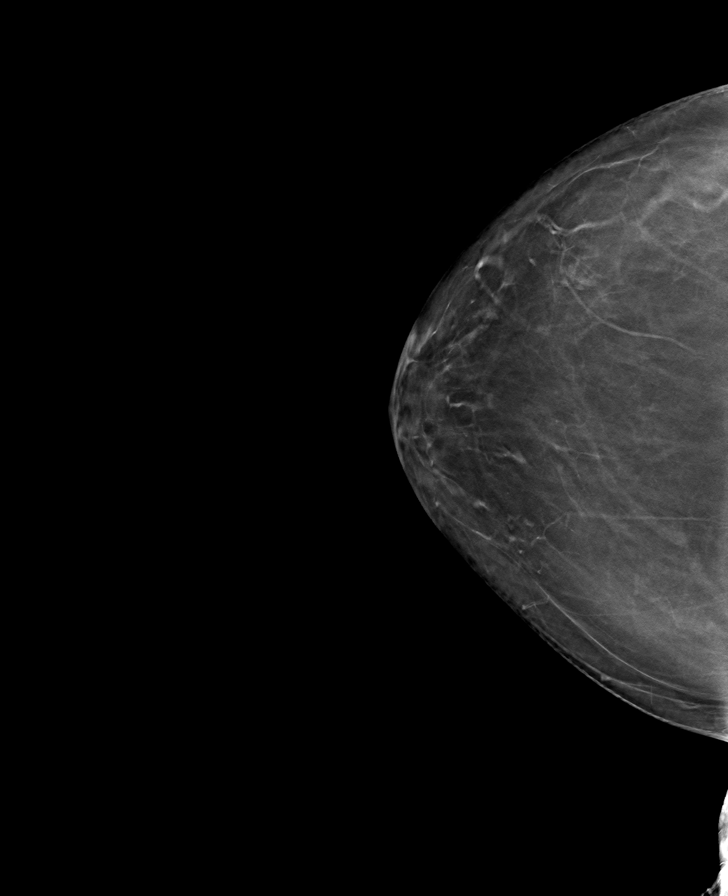

[R MLO tomo · tomo slice 37/73.0]
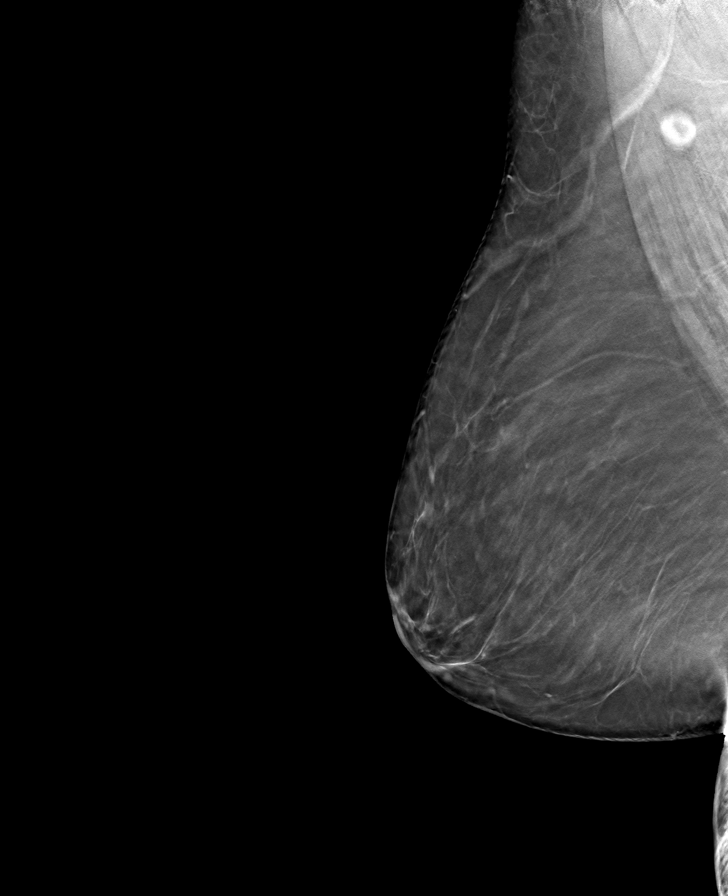

[L CC tomo · tomo slice 35/68.0]
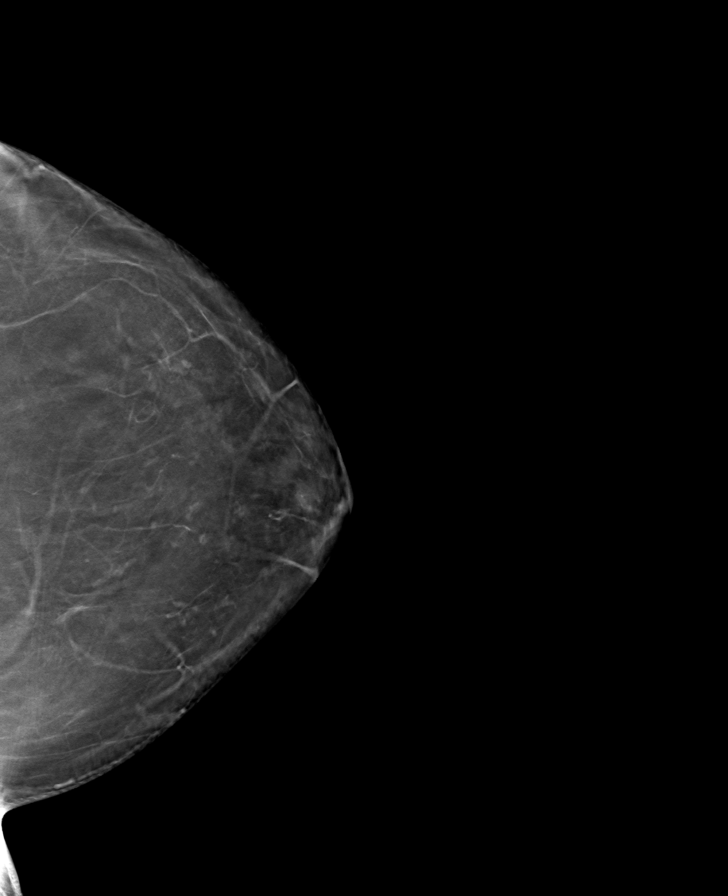

[8 of 24 positions shown; findings below may reference images not displayed]

ACR Breast Density Category b: There are scattered areas of
fibroglandular density.
FINDINGS: There are no findings suspicious for malignancy.
IMPRESSION: No mammographic evidence of malignancy. A result letter of this
screening mammogram will be mailed directly to the patient.

RECOMMENDATION:
Screening mammogram in one year. (Code:51-O-LD2)

BI-RADS CATEGORY  1: Negative.

## 2023-08-16 DIAGNOSIS — I471 Supraventricular tachycardia, unspecified: Secondary | ICD-10-CM | POA: Diagnosis not present

## 2023-08-16 DIAGNOSIS — R7303 Prediabetes: Secondary | ICD-10-CM | POA: Diagnosis not present

## 2023-08-16 DIAGNOSIS — E538 Deficiency of other specified B group vitamins: Secondary | ICD-10-CM | POA: Diagnosis not present

## 2023-08-16 DIAGNOSIS — R3129 Other microscopic hematuria: Secondary | ICD-10-CM | POA: Diagnosis not present

## 2023-08-16 DIAGNOSIS — R0609 Other forms of dyspnea: Secondary | ICD-10-CM | POA: Diagnosis not present

## 2023-08-16 DIAGNOSIS — Z2821 Immunization not carried out because of patient refusal: Secondary | ICD-10-CM | POA: Diagnosis not present

## 2023-08-16 DIAGNOSIS — M2550 Pain in unspecified joint: Secondary | ICD-10-CM | POA: Diagnosis not present

## 2023-08-16 DIAGNOSIS — E782 Mixed hyperlipidemia: Secondary | ICD-10-CM | POA: Diagnosis not present

## 2023-08-16 DIAGNOSIS — F331 Major depressive disorder, recurrent, moderate: Secondary | ICD-10-CM | POA: Diagnosis not present

## 2023-09-03 ENCOUNTER — Other Ambulatory Visit: Payer: Self-pay | Admitting: Internal Medicine

## 2023-09-03 DIAGNOSIS — J849 Interstitial pulmonary disease, unspecified: Secondary | ICD-10-CM

## 2023-09-13 ENCOUNTER — Ambulatory Visit

## 2023-09-30 DIAGNOSIS — E782 Mixed hyperlipidemia: Secondary | ICD-10-CM | POA: Diagnosis not present

## 2023-09-30 DIAGNOSIS — E538 Deficiency of other specified B group vitamins: Secondary | ICD-10-CM | POA: Diagnosis not present

## 2023-09-30 DIAGNOSIS — R7303 Prediabetes: Secondary | ICD-10-CM | POA: Diagnosis not present

## 2023-10-24 DIAGNOSIS — Z0000 Encounter for general adult medical examination without abnormal findings: Secondary | ICD-10-CM | POA: Diagnosis not present

## 2023-10-24 DIAGNOSIS — K760 Fatty (change of) liver, not elsewhere classified: Secondary | ICD-10-CM | POA: Diagnosis not present

## 2023-10-24 DIAGNOSIS — M51362 Other intervertebral disc degeneration, lumbar region with discogenic back pain and lower extremity pain: Secondary | ICD-10-CM | POA: Diagnosis not present

## 2023-10-24 DIAGNOSIS — R3129 Other microscopic hematuria: Secondary | ICD-10-CM | POA: Diagnosis not present

## 2023-10-24 DIAGNOSIS — Z1331 Encounter for screening for depression: Secondary | ICD-10-CM | POA: Diagnosis not present

## 2023-10-24 DIAGNOSIS — R7303 Prediabetes: Secondary | ICD-10-CM | POA: Diagnosis not present

## 2023-10-24 DIAGNOSIS — I471 Supraventricular tachycardia, unspecified: Secondary | ICD-10-CM | POA: Diagnosis not present

## 2023-10-24 DIAGNOSIS — E538 Deficiency of other specified B group vitamins: Secondary | ICD-10-CM | POA: Diagnosis not present

## 2023-10-24 DIAGNOSIS — F331 Major depressive disorder, recurrent, moderate: Secondary | ICD-10-CM | POA: Diagnosis not present

## 2023-10-24 DIAGNOSIS — E782 Mixed hyperlipidemia: Secondary | ICD-10-CM | POA: Diagnosis not present

## 2023-11-21 DIAGNOSIS — H2513 Age-related nuclear cataract, bilateral: Secondary | ICD-10-CM | POA: Diagnosis not present

## 2023-11-21 DIAGNOSIS — H40003 Preglaucoma, unspecified, bilateral: Secondary | ICD-10-CM | POA: Diagnosis not present

## 2023-11-21 DIAGNOSIS — M3501 Sicca syndrome with keratoconjunctivitis: Secondary | ICD-10-CM | POA: Diagnosis not present

## 2023-11-21 DIAGNOSIS — H43813 Vitreous degeneration, bilateral: Secondary | ICD-10-CM | POA: Diagnosis not present

## 2023-11-26 ENCOUNTER — Ambulatory Visit: Admitting: Psychiatry

## 2023-11-27 ENCOUNTER — Ambulatory Visit (INDEPENDENT_AMBULATORY_CARE_PROVIDER_SITE_OTHER): Admitting: Psychiatry

## 2023-11-27 ENCOUNTER — Encounter: Payer: Self-pay | Admitting: Psychiatry

## 2023-11-27 VITALS — BP 124/66 | HR 96 | Temp 98.7°F | Ht 59.0 in | Wt 137.4 lb

## 2023-11-27 DIAGNOSIS — F4310 Post-traumatic stress disorder, unspecified: Secondary | ICD-10-CM

## 2023-11-27 DIAGNOSIS — F332 Major depressive disorder, recurrent severe without psychotic features: Secondary | ICD-10-CM | POA: Diagnosis not present

## 2023-11-27 DIAGNOSIS — F411 Generalized anxiety disorder: Secondary | ICD-10-CM | POA: Diagnosis not present

## 2023-11-27 DIAGNOSIS — F424 Excoriation (skin-picking) disorder: Secondary | ICD-10-CM | POA: Diagnosis not present

## 2023-11-27 MED ORDER — TRAZODONE HCL 100 MG PO TABS
100.0000 mg | ORAL_TABLET | Freq: Every evening | ORAL | 1 refills | Status: AC | PRN
Start: 2023-11-27 — End: ?

## 2023-11-27 MED ORDER — FLUVOXAMINE MALEATE 100 MG PO TABS: 100.0000 mg | ORAL_TABLET | Freq: Every day | ORAL | 1 refills | Status: DC

## 2023-11-27 MED ORDER — HYDROXYZINE HCL 10 MG PO TABS
10.0000 mg | ORAL_TABLET | Freq: Three times a day (TID) | ORAL | 0 refills | Status: DC | PRN
Start: 2023-11-27 — End: 2023-12-25

## 2023-11-27 NOTE — Progress Notes (Signed)
 Psychiatric Initial Adult Assessment   Patient Identification: Joanna Mendoza MRN:  [id number] Date of Evaluation:  11/27/2023 Referral Source: [facility] primary Chief Complaint:   Chief Complaint  Patient presents with   Establish Care   Visit Diagnosis: No diagnosis found.  History of Present Illness: This 78 year old female presenting to [facility] for establish care.  Patient was previously seen at the age of 31 by Dr. Vickey in which the patient reports that she stopped coming on her own choice due to feeling that Dr. Vickey was not a good fit according to her.  Patient reports that she has been taking medications of fluvoxamine  100 mg once daily as well as trazodone  100 mg once daily for her psych at [facility] of depression as well as PTSD.  Patient reports she continues to experience stressors with her husband who is an alcoholic stating that he is trying to quit but he continues to drink.  Patient reports that she is having a lot of trouble and trying to take care of him as well as trying to manage her emotions while living at the home with them.  Patient endorses a significant amount of stress as she is observed in the room picking at her skin, with multiple scarring and scabs that are noted throughout both arms.  Patient also visualize picking at her fingernails beds and biting off her skin off her fingers.  Patient reports that she is unable to maintain her composure as her husband continues to stress her out due to feeling overwhelmed as the husband is not taking care of himself and she is experiencing caregiver fatigue and states that she even has physical limitations that causes her distress.  Patient does endorse suicidal thoughts but states these are passive and she does have not have an intent or plan.  Patient has been educated that should she develop a plan she should call 911 or go to the closest emergency department.  Based on her presentation as well stating of the situational  stressors patient has been recommended to follow up with therapy due to her insurance patient has been provided a referral list from her insurance website for licensed professional counselors and instructed to schedule counseling for her concerns.  Patient also recommended for a wait list with Almarie Ligas here at [facility] office.  Chart was from Dr. Vickey reviewed from previous visits.  Based on this assessment interview is recommended for the patient to continue medications of Luvox  100 mg once daily, trazodone  100 mg once daily and to add on and start 10 mg of hydroxyzine  3 times a day as needed for anxiety.  Patient has been educated that the therapy will be recommended and to follow up with therapy list provided to the patient by the provider.  Patient currently endorses suicidal thoughts but passive states that she does not have a plan.  Patient denies at HI and audiovisual hallucinations.  Patient with no other questions or concerns at this time patient is in agreement with treatment plan patient will follow up in 2 weeks.  This case will be shared with my supervising Dr. Arfeen.  Associated Signs/Symptoms: Depression Symptoms:  anhedonia, fatigue, hopelessness, anxiety, panic attacks, (Hypo) Manic Symptoms: Negative Anxiety Symptoms:  Excessive Worry, Excoriation Psychotic Symptoms: Negative PTSD Symptoms: Negative  Past Psychiatric History:  Medications current: - Luvox  100 mg once daily -Trazodone  100 mg once daily before bed -Hydroxyzine  10 mg 3 times a day as needed for anxiety - I do not know what  I really take but I just take whatever it says on the bottle Pt encouraged to bring medication on the next visit.  Outpatient:  Per chart review, her diagnosis includes depression, bipolar and personality disorder, with borderline histrionic traits. Dr, Chipper, Dr. Beverley, last seen a few months ago - Previously under the care of Dr. Vickey patient reports that she just stopped coming  to her appointments due to feeling that she was not a good fit for her care.  Patient reports that this was her choice to stop the visits. Psychiatry admission:  multiple of times, last in 11/2013 for depression with relapse in alcohol  use in the context of conflict with her step son Previous suicide attempt: denies Past trials of medication: sertraline, fluoxetine, lexapro, Abilify, (don't remember)  History of violence:   Legal: denies DUI, DWI  Previous Psychotropic Medications: Yes   Substance Abuse History in the last 12 months:  No.  Consequences of Substance Abuse: Negative  Past Medical History:  Past Medical History:  Diagnosis Date   Anxiety    Anxiety    Arthritis    Cognitive impairment    Colon polyps    DDD (degenerative disc disease), lumbar    Depression    Diverticulosis    Duodenal mass    Epigastric pain    Fatty liver    Frequent PVCs    GERD (gastroesophageal reflux disease)    History of chicken pox    Hypertension    IBS (irritable bowel syndrome)    Morbid obesity (HCC)    Nausea    OCD (obsessive compulsive disorder)    Osteopenia    Pre-diabetes    PSVT (paroxysmal supraventricular tachycardia) (HCC)    Renal cyst    Restless legs    Status post rotator cuff repair    Stroke [facility])    age 26   Suicidal ideation     Past Surgical History:  Procedure Laterality Date   ABDOMINAL HYSTERECTOMY     benign Left    breast mass removed   BREAST EXCISIONAL BIOPSY Left 2002   neg   BREAST SURGERY Left 2002   Breast Biopsy   CHOLECYSTECTOMY  2011   COLONOSCOPY N/A 11/27/2021   Procedure: COLONOSCOPY;  Surgeon: Maryruth Ole DASEN, MD;  Location: [facility] ENDOSCOPY;  Service: Endoscopy;  Laterality: N/A;   COLONOSCOPY WITH ESOPHAGOGASTRODUODENOSCOPY (EGD)     COLONOSCOPY WITH PROPOFOL  N/A 08/07/2017   Procedure: COLONOSCOPY WITH PROPOFOL ;  Surgeon: Viktoria Lamar DASEN, MD;  Location: [facility] ENDOSCOPY;  Service: Endoscopy;  Laterality: N/A;   ddd      ESOPHAGOGASTRODUODENOSCOPY (EGD) WITH PROPOFOL  N/A 08/07/2017   Procedure: ESOPHAGOGASTRODUODENOSCOPY (EGD) WITH PROPOFOL ;  Surgeon: Viktoria Lamar DASEN, MD;  Location: [facility] ENDOSCOPY;  Service: Endoscopy;  Laterality: N/A;   FOOT SURGERY     KNEE ARTHROPLASTY Left 04/11/2022   Procedure: COMPUTER ASSISTED TOTAL KNEE ARTHROPLASTY;  Surgeon: Mardee Lynwood SQUIBB, MD;  Location: [facility] ORS;  Service: Orthopedics;  Laterality: Left;   KNEE ARTHROSCOPY Left 2008   LAPAROSCOPIC BILATERAL SALPINGO OOPHERECTOMY     OOPHORECTOMY     SHOULDER ACROMIOPLASTY Right 04/06/2015   Procedure: SUBACROMIAL DECOMPRESSION, ROTATOR CUFF REPAIR;  Surgeon: Lynwood SQUIBB Mardee, MD;  Location: [facility] ORS;  Service: Orthopedics;  Laterality: Right;    Family Psychiatric History: No additional  Family History:  Family History  Problem Relation Age of Onset   Depression Mother    Depression Sister    Kidney disease Sister    Kidney failure  Sister    Depression Brother    Breast cancer Neg Hx    Kidney cancer Neg Hx    Prostate cancer Neg Hx     Social History:   Social History   Socioeconomic History   Marital status: Married    Spouse name: Not on file   Number of children: Not on file   Years of education: Not on file   Highest education level: 10th grade  Occupational History   Not on file  Tobacco Use   Smoking status: Never    Passive exposure: Never   Smokeless tobacco: Never  Vaping Use   Vaping status: Never Used  Substance and Sexual Activity   Alcohol  use: Yes    Comment: occ.   Drug use: No   Sexual activity: Not Currently  Other Topics Concern   Not on file  Social History Narrative   Not on file   Social Drivers of Health   Financial Resource Strain: Low Risk  (10/24/2023)   Received from [facility]   Overall Financial Resource Strain (CARDIA)    Difficulty of Paying Living Expenses: Not hard at all  Food Insecurity: No Food Insecurity (10/24/2023)   Received from [facility]   Hunger Vital Sign    Within the past 12 months, you worried that your food would run out before you got the money to buy more.: Never true    Within the past 12 months, the food you bought just didn't last and you didn't have money to get more.: Never true  Transportation Needs: No Transportation Needs (10/24/2023)   [facility] - Transportation    In the past 12 months, has lack of transportation kept you from medical appointments or from getting medications?: No    Lack of Transportation (Non-Medical): No  Physical Activity: Not on file  Stress: Not on file  Social Connections: Not on file    Additional Social History: No additional  Allergies:   Allergies  Allergen Reactions   Celexa [Citalopram] Other (See Comments)    unknown   Cogentin [Benztropine] Other (See Comments)    unknown   Flagyl [Metronidazole] Other (See Comments)    unknown   Hydrocodone      unsure   Lorazepam  Other (See Comments)    Worsening in irritability   Adhesive [Tape] Rash    Ok to use paper tape    Metabolic Disorder Labs: Lab Results  Component Value Date   HGBA1C 6.2 (H) 04/02/2022   MPG 131.24 04/02/2022   No results found for: PROLACTIN Lab Results  Component Value Date   CHOL 210 (H) 01/03/2021   TRIG 258 (H) 01/03/2021   HDL 45 01/03/2021   CHOLHDL 4.7 01/03/2021   VLDL 52 (H) 01/03/2021   LDLCALC 113 (H) 01/03/2021   Lab Results  Component Value Date   TSH 3.312 12/19/2020    Therapeutic Level Labs: No results found for: LITHIUM No results found for: CBMZ No results found for: VALPROATE  Current Medications: Current Outpatient Medications  Medication Sig Dispense Refill   calcium  carbonate (OS-CAL) 600 MG TABS tablet Take 600 mg by mouth daily.     Cholecalciferol  (VITAMIN D3 PO) Take 1 tablet by mouth daily.     Cyanocobalamin  (B-12 PO) Take 1 tablet by mouth daily.     fluvoxaMINE   (LUVOX ) 100 MG tablet Take 150 mg by mouth 2 (two) times daily.  hydroxypropyl methylcellulose / hypromellose (ISOPTO TEARS / GONIOVISC) 2.5 % ophthalmic solution Place 1 drop into both eyes as needed for dry eyes.     melatonin 10 MG TABS Take 10 mg by mouth at bedtime. 30 tablet 1   meloxicam (MOBIC) 15 MG tablet Take 15 mg by mouth daily.     Pyridoxine  HCl (B-6 PO) Take 1 tablet by mouth daily.     traMADol  (ULTRAM ) 50 MG tablet Take 50 mg by mouth every 6 (six) hours as needed for moderate pain (pain score 4-6).     traZODone  (DESYREL ) 100 MG tablet Take 1 tablet (100 mg total) by mouth at bedtime as needed for sleep. 30 tablet 3   Trospium  Chloride 60 MG CP24 Take 1 capsule (60 mg total) by mouth daily. 30 capsule 11   metoprolol  tartrate (LOPRESSOR ) 25 MG tablet Take 25 mg by mouth 2 (two) times daily.     omeprazole (PRILOSEC) 40 MG capsule Take 40 mg by mouth daily.     trospium  (SANCTURA ) 20 MG tablet Take 20 mg by mouth at bedtime.     No current facility-administered medications for this visit.    Musculoskeletal: Strength & Muscle Tone: decreased Gait & Station: normal Patient leans: N/A  Psychiatric Specialty Exam: Review of Systems  Constitutional: Negative.   HENT: Negative.    Eyes: Negative.   Respiratory: Negative.    Cardiovascular: Negative.   Gastrointestinal: Negative.   Endocrine: Negative.   Genitourinary: Negative.   Musculoskeletal: Negative.   Skin:  Positive for wound.       Picking at skin.  Allergic/Immunologic: Negative.   Neurological: Negative.   Hematological: Negative.     Blood pressure 124/66, pulse 96, temperature 98.7 F (37.1 C), temperature source Temporal, height 4' 11 (1.499 m), weight 137 lb 6.4 oz (62.3 kg), SpO2 95%.Body mass index is 27.75 kg/m.  General Appearance: Disheveled  Eye Contact:  Good  Speech:  Clear and Coherent  Volume:  Normal  Mood:  Irritable and Worthless  Affect:  Congruent and Depressed  Thought  Process:  Coherent  Orientation:  Full (Time, Place, and Person)  Thought Content:  Obsessions  Suicidal Thoughts:  Yes.  without intent/plan  Homicidal Thoughts:  No  Memory:  Immediate;   Good Recent;   Good Remote;   Good  Judgement:  Good  Insight:  Good  Psychomotor Activity:  Normal  Concentration:  Concentration: Good and Attention Span: Good  Recall:  Good  Fund of Knowledge:Good  Language: Good  Akathisia:  No  Handed:  Right  AIMS (if indicated):    Assets:  Desire for Improvement Financial Resources/Insurance Housing  ADL's:  Intact  Cognition: WNL  Sleep:  Good   Screenings: AUDIT    Flowsheet Row Admission (Discharged) from 12/28/2020 in [facility] INPATIENT BEH[facility]AL MEDICINE  Alcohol  Use Disorder Identification Test Final Score (AUDIT) 0   PHQ2-9    Flowsheet Row Office Visit from 08/01/2021 in confidential department Office Visit from 07/11/2021 in confidential department Office Visit from 07/04/2021 in confidential department Office Visit from 05/15/2021 in confidential department Video Visit from 02/15/2021 in confidential department  PHQ-2 Total Score 6 2 2 5  0  PHQ-9 Total Score 20 6 9 19  --   Flowsheet Row Admission (Discharged) from 04/11/2022 in [facility] (1[facility] ED from 12/22/2021 in [facility] Emergency Department at [facility] Admission (Discharged) from 11/27/2021 in [facility]  C-SSRS RISK CATEGORY No Risk High Risk Error:  Question 6 not populated    Assessment and Plan:  Assessment - Diagnosis: Severe episode of recurrent major depressive disorder, without psychotic features (HCC) [F33.2]  2. PTSD (post-traumatic stress disorder) [F43.10]  3. 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 Continue Trazadone 100mg  once  daily before bed as needed for sleep. Start Hydroxyzine  10mg  TID as needed. Pt educated of sedating nature when initially starting the medication.  - Psychotherapy: Recommended, referral list provided to the patient.  Pt instructed to prioritize therapy.  Pt placed on waitlist for Almarie Ligas. - Education: Patient has been educated on taking the medications as prescribed.  Patient reports that she has not been taking the medication has been taken as she feels which she was educated on the negative impacts if not staying consistent on medications.  Patient has not been educated on medications, side effects, adverse reactions, and purpose.  Patient has been educated on how to reach the provider through my chart messaging as well as calling the clinic. - Follow-Up: Patient will follow up in 2 weeks in person - Referrals: Patient has been given a referral list for local therapist available while waiting for Almarie Ligas. - Safety Planning: The patient has been educated, if they should have suicidal thoughts with or without a plan to call 911, or go to the closest emergency department.  Pt verbalized understanding.  Pt denies firearms within the home.  Pt also agrees to call the clinic should they have worsening symptoms before the next appointment.     Patient/Guardian was advised Release of Information must be obtained prior to any record release in order to collaborate their care with an outside provider. Patient/Guardian was advised if they have not already done so to contact the registration department to sign all necessary forms in order for us  to release information regarding their care.   Consent: Patient/Guardian gives verbal consent for treatment and assignment of benefits for services provided during this visit. Patient/Guardian expressed understanding and agreed to proceed.   Dorn Jama Der, NP 6/25/20259:51 AM

## 2023-12-11 ENCOUNTER — Ambulatory Visit: Admitting: Psychiatry

## 2023-12-11 ENCOUNTER — Encounter: Payer: Self-pay | Admitting: Psychiatry

## 2023-12-11 VITALS — BP 124/68 | HR 93 | Temp 98.6°F | Ht 59.0 in | Wt 135.4 lb

## 2023-12-11 DIAGNOSIS — F424 Excoriation (skin-picking) disorder: Secondary | ICD-10-CM

## 2023-12-11 DIAGNOSIS — F411 Generalized anxiety disorder: Secondary | ICD-10-CM | POA: Diagnosis not present

## 2023-12-11 DIAGNOSIS — F4310 Post-traumatic stress disorder, unspecified: Secondary | ICD-10-CM

## 2023-12-11 DIAGNOSIS — F332 Major depressive disorder, recurrent severe without psychotic features: Secondary | ICD-10-CM | POA: Diagnosis not present

## 2023-12-11 NOTE — Progress Notes (Signed)
 [facility] MD/PA/NP OP Progress Note  12/11/2023 9:39 AM Joanna Mendoza  MRN:  [id number]  Chief Complaint:  Chief Complaint  Patient presents with   Follow-up   HPI: 78 year old female presenting to [facility] for follow-up.  Patient reports with good news stating that she is feeling more affection and love from her husband and stating that that experience she had at home has been much better and her anxiety has been a little bit better controlled.  Patient reports that she has been communicating with her husband stating appreciation for staying at home more often stating that he has cut down on his drinking and is working towards improving his alcohol  use.  Patient brought in medicine bag in which she was confirmed all of her medication regimen in which she has been educated to take 150 mg of Luvox  once daily, hydroxyzine  1 mg 3 times a day only as needed, in which she states she has been taking 3 times a day around the clock and when she was discouraged to do so.  Patient also taking trazodone  100 mg once a day before bed.  Patient states that symptoms of anxiety have decreased quite a bit due to her interactions with her husband's have been better but states that she still picks at her skin.  Patient is noted with scabs throughout her upper arms as well as her nose in which she is always picking at her skin.  Patient tried to find a therapist through the referral is printed from her insurance agency in which she was not able to successfully find therapist.  This provider will be assisting the patient in finding a therapist and she will be informed once a therapist has been found utilizing outside means of resources to include [place]  database of psychotherapists.  Patient is in agreement with treatment plan patient with no other questions or concerns at this time patient PHQ-9 score is 9 and GAD-7 is 12.  Patient will follow up in 2 weeks as we try to find a therapist for the patient.  I personally spent a  total of 30 minutes in the care of the patient today including preparing to see the patient, getting/reviewing separately obtained history, performing a medically appropriate exam/evaluation, counseling and educating, referring and communicating with other health care professionals, documenting clinical information in the EHR, and coordinating care.  Visit Diagnosis:    ICD-10-CM   1. Severe episode of recurrent major depressive disorder, without psychotic features (HCC)  F33.2     2. Generalized anxiety disorder  F41.1     3. PTSD (post-traumatic stress disorder)  F43.10     4. 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  Outpatient:  Per chart review, her diagnosis includes depression, bipolar and personality disorder, with borderline histrionic traits. Dr, Chipper, Dr. Beverley, last seen a few months ago - Previously under the care of Dr. Vickey patient reports that she just stopped coming to her appointments due to feeling that she was not a good fit for her care.  Patient reports that this was her choice to stop the visits. Psychiatry admission:  multiple of times, last  in 11/2013 for depression with relapse in alcohol  use in the context of conflict with her step son Previous suicide attempt: denies Past trials of medication: sertraline, fluoxetine, lexapro, Abilify, (don't remember)  History of violence:   Legal: denies DUI, DWI  Past Medical History:  Past Medical History:  Diagnosis Date   Anxiety    Anxiety    Arthritis    Cognitive impairment    Colon polyps    DDD (degenerative disc disease), lumbar    Depression    Diverticulosis    Duodenal mass    Epigastric  pain    Fatty liver    Frequent PVCs    GERD (gastroesophageal reflux disease)    History of chicken pox    Hypertension    IBS (irritable bowel syndrome)    Morbid obesity (HCC)    Nausea    OCD (obsessive compulsive disorder)    Osteopenia    Pre-diabetes    PSVT (paroxysmal supraventricular tachycardia) (HCC)    Renal cyst    Restless legs    Status post rotator cuff repair    Stroke [facility])    age 50   Suicidal ideation     Past Surgical History:  Procedure Laterality Date   ABDOMINAL HYSTERECTOMY     benign Left    breast mass removed   BREAST EXCISIONAL BIOPSY Left 2002   neg   BREAST SURGERY Left 2002   Breast Biopsy   CHOLECYSTECTOMY  2011   COLONOSCOPY N/A 11/27/2021   Procedure: COLONOSCOPY;  Surgeon: Maryruth Ole DASEN, MD;  Location: [facility] ENDOSCOPY;  Service: Endoscopy;  Laterality: N/A;   COLONOSCOPY WITH ESOPHAGOGASTRODUODENOSCOPY (EGD)     COLONOSCOPY WITH PROPOFOL  N/A 08/07/2017   Procedure: COLONOSCOPY WITH PROPOFOL ;  Surgeon: Viktoria Lamar DASEN, MD;  Location: [facility] ENDOSCOPY;  Service: Endoscopy;  Laterality: N/A;   ddd     ESOPHAGOGASTRODUODENOSCOPY (EGD) WITH PROPOFOL  N/A 08/07/2017   Procedure: ESOPHAGOGASTRODUODENOSCOPY (EGD) WITH PROPOFOL ;  Surgeon: Viktoria Lamar DASEN, MD;  Location: [facility] ENDOSCOPY;  Service: Endoscopy;  Laterality: N/A;   FOOT SURGERY     KNEE ARTHROPLASTY Left 04/11/2022   Procedure: COMPUTER ASSISTED TOTAL KNEE ARTHROPLASTY;  Surgeon: Mardee Lynwood SQUIBB, MD;  Location: [facility] ORS;  Service: Orthopedics;  Laterality: Left;   KNEE ARTHROSCOPY Left 2008   LAPAROSCOPIC BILATERAL SALPINGO OOPHERECTOMY     OOPHORECTOMY     SHOULDER ACROMIOPLASTY Right 04/06/2015   Procedure: SUBACROMIAL DECOMPRESSION, ROTATOR CUFF REPAIR;  Surgeon: Lynwood SQUIBB Mardee, MD;  Location: [facility] ORS;  Service: Orthopedics;  Laterality: Right;    Family Psychiatric History: No additional  Family History:  Family History  Problem Relation Age of Onset   Depression  Mother    Depression Sister    Kidney disease Sister    Kidney failure Sister    Depression Brother    Breast cancer Neg Hx    Kidney cancer Neg Hx    Prostate cancer Neg Hx     Social History:  Social History   Socioeconomic History   Marital status: Married    Spouse name: Not on file   Number of children: 3   Years of education: Not on file   Highest education level: 10th grade  Occupational History   Not on file  Tobacco Use   Smoking status: Never    Passive exposure: Never   Smokeless tobacco: Never  Vaping Use   Vaping status: Never Used  Substance and Sexual Activity   Alcohol  use: Yes  Comment: occ.   Drug use: No   Sexual activity: Not Currently  Other Topics Concern   Not on file  Social History Narrative   Not on file   Social Drivers of Health   Financial Resource Strain: Low Risk  (10/24/2023)   [facility]   Overall Financial Resource Strain (CARDIA)    Difficulty of Paying Living Expenses: Not hard at all  Food Insecurity: No Food Insecurity (10/24/2023)   Received from [facility]   Hunger Vital Sign    Within the past 12 months, you worried that your food would run out before you got the money to buy more.: Never true    Within the past 12 months, the food you bought just didn't last and you didn't have money to get more.: Never true  Transportation Needs: No Transportation Needs (10/24/2023)   Received from [facility] - Transportation    In the past 12 months, has lack of transportation kept you from medical appointments or from getting medications?: No    Lack of Transportation (Non-Medical): No  Physical Activity: Not on file  Stress: Not on file  Social Connections: Not on file    Allergies:  Allergies  Allergen Reactions   Celexa [Citalopram] Other (See Comments)    unknown   Cogentin [Benztropine] Other (See Comments)    unknown   Flagyl  [Metronidazole] Other (See Comments)    unknown   Hydrocodone      unsure   Lorazepam  Other (See Comments)    Worsening in irritability   Adhesive [Tape] Rash    Ok to use paper tape    Metabolic Disorder Labs: Lab Results  Component Value Date   HGBA1C 6.2 (H) 04/02/2022   MPG 131.24 04/02/2022   No results found for: PROLACTIN Lab Results  Component Value Date   CHOL 210 (H) 01/03/2021   TRIG 258 (H) 01/03/2021   HDL 45 01/03/2021   CHOLHDL 4.7 01/03/2021   VLDL 52 (H) 01/03/2021   LDLCALC 113 (H) 01/03/2021   Lab Results  Component Value Date   TSH 3.312 12/19/2020   TSH 1.88 11/08/2012    Therapeutic Level Labs: No results found for: LITHIUM No results found for: VALPROATE No results found for: CBMZ  Current Medications: Current Outpatient Medications  Medication Sig Dispense Refill   calcium  carbonate (OS-CAL) 600 MG TABS tablet Take 600 mg by mouth daily.     Cholecalciferol  (VITAMIN D3 PO) Take 1 tablet by mouth daily.     Cyanocobalamin  (B-12 PO) Take 1 tablet by mouth daily.     diclofenac Sodium (VOLTAREN) 1 % GEL Apply 2 g topically.     estradiol  (ESTRACE ) 0.1 MG/GM vaginal cream Insert fingertip unit amount twice weekly for maintenance     fluvoxaMINE  (LUVOX ) 100 MG tablet Take 1 tablet (100 mg total) by mouth at bedtime. 90 tablet 1   hydroxypropyl methylcellulose / hypromellose (ISOPTO TEARS / GONIOVISC) 2.5 % ophthalmic solution Place 1 drop into both eyes as needed for dry eyes.     hydrOXYzine  (ATARAX ) 10 MG tablet Take 1 tablet (10 mg total) by mouth 3 (three) times daily as needed for anxiety. 90 tablet 0   meclizine  (ANTIVERT ) 25 MG tablet Take by mouth.     melatonin 10 MG TABS Take 10 mg by mouth at bedtime. 30 tablet 1   meloxicam (MOBIC) 15 MG tablet Take 15 mg by mouth daily.  metoprolol  tartrate (LOPRESSOR ) 25 MG tablet Take 25 mg by mouth 2 (two) times daily.     omeprazole (PRILOSEC) 40 MG capsule Take 40 mg by mouth daily.      Pyridoxine  HCl (B-6 PO) Take 1 tablet by mouth daily.     traMADol  (ULTRAM ) 50 MG tablet Take 50 mg by mouth every 6 (six) hours as needed for moderate pain (pain score 4-6).     traZODone  (DESYREL ) 100 MG tablet Take 1 tablet (100 mg total) by mouth at bedtime as needed for sleep. 90 tablet 1   trospium  (SANCTURA ) 20 MG tablet Take 20 mg by mouth at bedtime.     Trospium  Chloride 60 MG CP24 Take 1 capsule (60 mg total) by mouth daily. 30 capsule 11   No current facility-administered medications for this visit.     Musculoskeletal: Strength & Muscle Tone: within normal limits Gait & Station: normal Patient leans: N/A  Psychiatric Specialty Exam: Review of Systems  Constitutional: Negative.   HENT: Negative.    Eyes: Negative.   Respiratory: Negative.    Cardiovascular: Negative.   Gastrointestinal: Negative.   Endocrine: Negative.   Genitourinary: Negative.   Musculoskeletal: Negative.   Skin: Negative.   Allergic/Immunologic: Negative.   Neurological: Negative.   Hematological: Negative.   Psychiatric/Behavioral:  Positive for dysphoric mood. The patient is nervous/anxious.     Blood pressure 124/68, pulse 93, temperature 98.6 F (37 C), temperature source Temporal, height 4' 11 (1.499 m), weight 135 lb 6.4 oz (61.4 kg), SpO2 96%.Body mass index is 27.35 kg/m.  General Appearance: Well Groomed  Eye Contact:  Good  Speech:  Clear and Coherent  Volume:  Normal  Mood:  Anxious and Depressed  Affect:  Appropriate  Thought Process:  Coherent  Orientation:  Full (Time, Place, and Person)  Thought Content: Logical   Suicidal Thoughts:  No  Homicidal Thoughts:  No  Memory:  Immediate;   Good Recent;   Good Remote;   Good  Judgement:  Good  Insight:  Good  Psychomotor Activity:  Normal  Concentration:  Concentration: Good and Attention Span: Good  Recall:  Good  Fund of Knowledge: Good  Language: Good  Akathisia:  No  Handed:  Right  AIMS (if indicated):    Assets:  Desire for Improvement Financial Resources/Insurance Housing  ADL's:  Intact  Cognition: WNL  Sleep:  Good   Screenings: AUDIT    Flowsheet Row Admission (Discharged) from 12/28/2020 in [facility]  Alcohol  Use Disorder Identification Test Final Score (AUDIT) 0   GAD-7    Flowsheet Row Office Visit from 11/27/2023 in [facility]  Total GAD-7 Score 21   PHQ2-9    Flowsheet Row Office Visit from 11/27/2023 in [facility] Office Visit from 08/01/2021 in confidential department Office Visit from 07/11/2021 in confidential department Office Visit from 07/04/2021 in confidential department Office Visit from 05/15/2021 in confidential department  PHQ-2 Total Score 6 6 2 2 5   PHQ-9 Total Score 22 20 6 9 19    Flowsheet Row Office Visit from 11/27/2023 in [facility] Admission (Discharged) from 04/11/2022 in [facility] ED from 12/22/2021 in [facility] at [facility]  C-SSRS RISK CATEGORY Low Risk No Risk High Risk     Assessment and Plan:  Assessment - Diagnosis: Severe episode of recurrent major depressive disorder, without psychotic features (HCC) [F33.2]  2. PTSD (post-traumatic  stress disorder) [F43.10]  3. Excoriation (skin-picking) disorder [F42.4]   - Progress: Baseline Appointment - Risk Factors:   Plan - Medications:  Continue Fluvox 150mg  once daily in the morning, for depression and excoriation tendencies. Continue Trazadone 100mg  once daily before bed as needed for sleep. Start Hydroxyzine  10mg  TID as needed. Pt educated of sedating nature when initially starting the medication.  - Psychotherapy: Recommended, referral list provided to the patient.  Pt instructed to prioritize therapy.  Pt placed on waitlist for Almarie Ligas. - Education: Patient has been  educated on taking the medications as prescribed.  Patient reports that she has not been taking the medication has been taken as she feels which she was educated on the negative impacts if not staying consistent on medications.  Patient has not been educated on medications, side effects, adverse reactions, and purpose.  Patient has been educated on how to reach the provider through my chart messaging as well as calling the clinic. - Follow-Up: Patient will follow up in 2 weeks in person - Referrals: Patient has been given a referral list for local therapist available while waiting for Almarie Ligas. - Safety Planning: The patient has been educated, if they should have suicidal thoughts with or without a plan to call 911, or go to the closest emergency department.  Pt verbalized understanding.  Pt denies firearms within the home.  Pt also agrees to call the clinic should they have worsening symptoms before the next appointment.  Patient/Guardian was advised Release of Information must be obtained prior to any record release in order to collaborate their care with an outside provider. Patient/Guardian was advised if they have not already done so to contact the registration department to sign all necessary forms in order for us  to release information regarding their care.   Consent: Patient/Guardian gives verbal consent for treatment and assignment of benefits for services provided during this visit. Patient/Guardian expressed understanding and agreed to proceed.    Dorn Jama Der, NP 12/11/2023, 9:39 AM

## 2023-12-18 ENCOUNTER — Ambulatory Visit: Admitting: Psychiatry

## 2023-12-18 DIAGNOSIS — R399 Unspecified symptoms and signs involving the genitourinary system: Secondary | ICD-10-CM | POA: Diagnosis not present

## 2023-12-25 ENCOUNTER — Encounter: Payer: Self-pay | Admitting: Psychiatry

## 2023-12-25 ENCOUNTER — Other Ambulatory Visit: Payer: Self-pay

## 2023-12-25 ENCOUNTER — Ambulatory Visit: Admitting: Psychiatry

## 2023-12-25 VITALS — BP 117/77 | HR 94 | Temp 97.9°F | Ht 59.0 in | Wt 136.4 lb

## 2023-12-25 DIAGNOSIS — R399 Unspecified symptoms and signs involving the genitourinary system: Secondary | ICD-10-CM | POA: Diagnosis not present

## 2023-12-25 DIAGNOSIS — F411 Generalized anxiety disorder: Secondary | ICD-10-CM | POA: Diagnosis not present

## 2023-12-25 DIAGNOSIS — F332 Major depressive disorder, recurrent severe without psychotic features: Secondary | ICD-10-CM

## 2023-12-25 DIAGNOSIS — F4310 Post-traumatic stress disorder, unspecified: Secondary | ICD-10-CM

## 2023-12-25 DIAGNOSIS — F424 Excoriation (skin-picking) disorder: Secondary | ICD-10-CM

## 2023-12-25 MED ORDER — HYDROXYZINE HCL 10 MG PO TABS
10.0000 mg | ORAL_TABLET | Freq: Three times a day (TID) | ORAL | 1 refills | Status: AC | PRN
Start: 2023-12-25 — End: ?

## 2023-12-25 NOTE — Progress Notes (Signed)
 [facility] MD/PA/NP OP Progress Note  12/25/2023 9:39 AM Joanna Mendoza  MRN:  [id number]  Chief Complaint:  Chief Complaint  Patient presents with   Follow-up   HPI: 78 year old female presenting [facility] for follow-up.  Patient reports this week that she is very happy that her husband has stopped drinking and states that she he is showing affection and she is feeling better.  Patient reports though that she is still having urges to want to pick at her nose with skin healing noted on the nose as well as scabs all over her arms.  When asked about what causes her to feel this way to want to pick, the urge, she reports that is the anxiety of her son-in-law that is the son of her husband who she feels is a bad influence for her husband.  Patient reports that the son-in-law makes his her husband drive him to work and to other places in which she feels that he could be a bad influence to them.  Patient was reminded to pay attention to the motivation of what is causing the patient patient's husband to want to be sober and wished to redirect at if she concerned about the influence if she should improve the communication with her husband.  She states that she does not openly talk to her husband about her concerns regarding his son in which she was encouraged to do so.  Patient was also provided education on skin picking provided by the practice which [facility] was provided to him in the practice of writing the urge.  The exercise was provided) copy to the patient and patient has been printed out a copy AVS.  Patient states that the medications are going well she is taking the medications as prescribed.  Please see plan.  Patient denies SI, HI, AVH.  Patient is to wait for therapy when slot becomes available for Almarie Ligas here at [facility].  Patient is in agreement with treatment plan patient with no other questions or concerns at this time.  Follow up in 2 weeks. Visit Diagnosis:    ICD-10-CM   1. Severe  episode of recurrent major depressive disorder, without psychotic features (HCC)  F33.2     2. Generalized anxiety disorder  F41.1 hydrOXYzine  (ATARAX ) 10 MG tablet    3. PTSD (post-traumatic stress disorder)  F43.10     4. 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  Outpatient:  Per chart review, her diagnosis includes depression, bipolar and personality disorder, with borderline histrionic traits. Dr, Chipper, Dr. Beverley, last seen a few months ago - Previously under the care of Dr. Vickey patient reports that she just stopped coming to her appointments due to feeling that she was not a good fit for her care.  Patient reports that this was her choice to stop the visits. Psychiatry admission:  multiple of times, last in 11/2013 for depression with relapse in alcohol  use in the context of conflict with her step son Previous suicide attempt: denies Past trials of medication: sertraline, fluoxetine, lexapro, Abilify, (don't remember)  History of violence:   Legal:  denies DUI, DWI  Past Medical History:  Past Medical History:  Diagnosis Date   Anxiety    Anxiety    Arthritis    Cognitive impairment    Colon polyps    DDD (degenerative disc disease), lumbar    Depression    Diverticulosis    Duodenal mass    Epigastric pain    Fatty liver    Frequent PVCs    GERD (gastroesophageal reflux disease)    History of chicken pox    Hypertension    IBS (irritable bowel syndrome)    Morbid obesity (HCC)    Nausea    OCD (obsessive compulsive disorder)    Osteopenia    Pre-diabetes    PSVT (paroxysmal  supraventricular tachycardia) (HCC)    Renal cyst    Restless legs    Status post rotator cuff repair    Stroke [facility])    age 61   Suicidal ideation     Past Surgical History:  Procedure Laterality Date   ABDOMINAL HYSTERECTOMY     benign Left    breast mass removed   BREAST EXCISIONAL BIOPSY Left 2002   neg   BREAST SURGERY Left 2002   Breast Biopsy   CHOLECYSTECTOMY  2011   COLONOSCOPY N/A 11/27/2021   Procedure: COLONOSCOPY;  Surgeon: Maryruth Ole DASEN, MD;  Location: [facility] ENDOSCOPY;  Service: Endoscopy;  Laterality: N/A;   COLONOSCOPY WITH ESOPHAGOGASTRODUODENOSCOPY (EGD)     COLONOSCOPY WITH PROPOFOL  N/A 08/07/2017   Procedure: COLONOSCOPY WITH PROPOFOL ;  Surgeon: Viktoria Lamar DASEN, MD;  Location: [facility] ENDOSCOPY;  Service: Endoscopy;  Laterality: N/A;   ddd     ESOPHAGOGASTRODUODENOSCOPY (EGD) WITH PROPOFOL  N/A 08/07/2017   Procedure: ESOPHAGOGASTRODUODENOSCOPY (EGD) WITH PROPOFOL ;  Surgeon: Viktoria Lamar DASEN, MD;  Location[facility] ENDOSCOPY;  Service: Endoscopy;  Laterality: N/A;   FOOT SURGERY     KNEE ARTHROPLASTY Left 04/11/2022   Procedure: COMPUTER ASSISTED TOTAL KNEE ARTHROPLASTY;  Surgeon: Mardee Lynwood SQUIBB, MD;  Location: [facility] ORS;  Service: Orthopedics;  Laterality: Left;   KNEE ARTHROSCOPY Left 2008   LAPAROSCOPIC BILATERAL SALPINGO OOPHERECTOMY     OOPHORECTOMY     SHOULDER ACROMIOPLASTY Right 04/06/2015   Procedure: SUBACROMIAL DECOMPRESSION, ROTATOR CUFF REPAIR;  Surgeon: Lynwood SQUIBB Mardee, MD;  Location: [facility] ORS;  Service: Orthopedics;  Laterality: Right;    Family Psychiatric History: No additional  Family History:  Family History  Problem Relation Age of Onset   Depression Mother    Depression Sister    Kidney disease Sister    Kidney failure Sister    Depression Brother    Breast cancer Neg Hx    Kidney cancer Neg Hx    Prostate cancer Neg Hx     Social History:  Social History   Socioeconomic History   Marital status: Married    Spouse name:  Not on file   Number of children: 3   Years of education: Not on file   Highest education level: 10th grade  Occupational History   Not on file  Tobacco Use   Smoking status: Never    Passive exposure: Never   Smokeless tobacco: Never  Vaping Use   Vaping status: Never Used  Substance and Sexual Activity   Alcohol  use: Yes    Comment: occ.   Drug use: No   Sexual activity: Not Currently  Other Topics Concern   Not on file  Social History Narrative   Not on file   Social Drivers of Health  Financial Resource Strain: Low Risk  (10/24/2023)   [facility]   Overall Financial Resource Strain (CARDIA)    Difficulty of Paying Living Expenses: Not hard at all  Food Insecurity: No Food Insecurity (10/24/2023)   Received from [facility]   Hunger Vital Sign    Within the past 12 months, you worried that your food would run out before you got the money to buy more.: Never true    Within the past 12 months, the food you bought just didn't last and you didn't have money to get more.: Never true  Transportation Needs: No Transportation Needs (10/24/2023)   Received from [facility] - Transportation    In the past 12 months, has lack of transportation kept you from medical appointments or from getting medications?: No    Lack of Transportation (Non-Medical): No  Physical Activity: Not on file  Stress: Not on file  Social Connections: Not on file    Allergies:  Allergies  Allergen Reactions   Celexa [Citalopram] Other (See Comments)    unknown   Cogentin [Benztropine] Other (See Comments)    unknown   Flagyl [Metronidazole] Other (See Comments)    unknown   Hydrocodone      unsure   Lorazepam  Other (See Comments)    Worsening in irritability   Adhesive [Tape] Rash    Ok to use paper tape    Metabolic Disorder Labs: Lab Results  Component Value Date   HGBA1C 6.2 (H) 04/02/2022   MPG 131.24  04/02/2022   No results found for: PROLACTIN Lab Results  Component Value Date   CHOL 210 (H) 01/03/2021   TRIG 258 (H) 01/03/2021   HDL 45 01/03/2021   CHOLHDL 4.7 01/03/2021   VLDL 52 (H) 01/03/2021   LDLCALC 113 (H) 01/03/2021   Lab Results  Component Value Date   TSH 3.312 12/19/2020   TSH 1.88 11/08/2012    Therapeutic Level Labs: No results found for: LITHIUM No results found for: VALPROATE No results found for: CBMZ  Current Medications: Current Outpatient Medications  Medication Sig Dispense Refill   calcium  carbonate (OS-CAL) 600 MG TABS tablet Take 600 mg by mouth daily.     Cholecalciferol  (VITAMIN D3 PO) Take 1 tablet by mouth daily.     Cyanocobalamin  (B-12 PO) Take 1 tablet by mouth daily.     diclofenac Sodium (VOLTAREN) 1 % GEL Apply 2 g topically.     estradiol  (ESTRACE ) 0.1 MG/GM vaginal cream Insert fingertip unit amount twice weekly for maintenance     fluvoxaMINE  (LUVOX ) 100 MG tablet Take 1 tablet (100 mg total) by mouth at bedtime. 90 tablet 1   hydroxypropyl methylcellulose / hypromellose (ISOPTO TEARS / GONIOVISC) 2.5 % ophthalmic solution Place 1 drop into both eyes as needed for dry eyes.     hydrOXYzine  (ATARAX ) 10 MG tablet Take 1 tablet (10 mg total) by mouth 3 (three) times daily as needed for anxiety. 90 tablet 0   meclizine  (ANTIVERT ) 25 MG tablet Take by mouth.     melatonin 10 MG TABS Take 10 mg by mouth at bedtime. 30 tablet 1   meloxicam (MOBIC) 15 MG tablet Take 15 mg by mouth daily.     omeprazole (PRILOSEC) 40 MG capsule Take 40 mg by mouth daily.     Pyridoxine  HCl (B-6 PO) Take 1 tablet by mouth daily.     traMADol  (ULTRAM ) 50 MG tablet Take 50 mg by  mouth every 6 (six) hours as needed for moderate pain (pain score 4-6).     traZODone  (DESYREL ) 100 MG tablet Take 1 tablet (100 mg total) by mouth at bedtime as needed for sleep. 90 tablet 1   trospium  (SANCTURA ) 20 MG tablet Take 20 mg by mouth at bedtime.     Trospium  Chloride  60 MG CP24 Take 1 capsule (60 mg total) by mouth daily. 30 capsule 11   No current facility-administered medications for this visit.     Musculoskeletal: Strength & Muscle Tone: within normal limits Gait & Station: normal Patient leans: N/A  Psychiatric Specialty Exam: Review of Systems  Constitutional: Negative.   HENT: Negative.    Eyes: Negative.   Respiratory: Negative.    Cardiovascular: Negative.   Gastrointestinal: Negative.   Endocrine: Negative.   Genitourinary: Negative.   Musculoskeletal: Negative.   Skin: Negative.   Allergic/Immunologic: Negative.   Neurological: Negative.   Hematological: Negative.   Psychiatric/Behavioral:  Positive for behavioral problems. The patient is nervous/anxious.        OCD tendencies    Blood pressure 117/77, pulse 94, temperature 97.9 F (36.6 C), temperature source Temporal, height 4' 11 (1.499 m), weight 136 lb 6.4 oz (61.9 kg).Body mass index is 27.55 kg/m.  General Appearance: Well Groomed  Eye Contact:  Good  Speech:  Clear and Coherent  Volume:  Normal  Mood:  Anxious  Affect:  Appropriate  Thought Process:  Coherent  Orientation:  Full (Time, Place, and Person)  Thought Content: Logical   Suicidal Thoughts:  No  Homicidal Thoughts:  No  Memory:  Immediate;   Good Recent;   Good Remote;   Good  Judgement:  Good  Insight:  Good  Psychomotor Activity:  Normal  Concentration:  Concentration: Good and Attention Span: Good  Recall:  Good  Fund of Knowledge: Good  Language: Good  Akathisia:  No  Handed:  Right  AIMS (if indicated): not done  Assets:  Desire for Improvement Financial Resources/Insurance Housing  ADL's:  Intact  Cognition: WNL  Sleep:  Good   Screenings: AUDIT    Flowsheet Row Admission (Discharged) from 12/28/2020 in [facility]  Alcohol  Use Disorder Identification Test Final Score (AUDIT) 0   GAD-7    Flowsheet Row Office Visit from 12/11/2023 in [facility] Office Visit from 11/27/2023 in [facility]  Total GAD-7 Score 12 21   PHQ2-9    Flowsheet Row Office Visit from 12/11/2023 in [facility] Office Visit from 11/27/2023 in [facility] Office Visit from 08/01/2021 in confidential department Office Visit from 07/11/2021 in confidential department Office Visit from 07/04/2021 in confidential department  PHQ-2 Total Score 5 6 6 2 2   PHQ-9 Total Score 9 22 20 6 9    Flowsheet Row Office Visit from 12/11/2023 in [facility] Office Visit from 11/27/2023 in [facility] Admission (Discharged) from 04/11/2022 in [facility] (1A)  C-SSRS RISK CATEGORY No Risk Low Risk No Risk     Assessment and Plan:  Assessment - Diagnosis: Severe episode of recurrent major depressive disorder, without psychotic features (HCC) [F33.2]  2. PTSD (post-traumatic stress disorder) [F43.10]  3. Excoriation (skin-picking) disorder [F42.4]   - Progress: Pt reports good improvement in the home with husband, reports he is not drinking any longer.  - Risk Factors: Worsening symptoms, OCD  tendencies.   Plan - Medications:  Continue Fluvox 150mg  once daily in the morning, for depression and excoriation tendencies. Continue Trazadone 100mg  once daily before bed as needed for sleep. Start Hydroxyzine  10mg  TID as needed. Pt educated of sedating nature when initially starting the medication.  - Psychotherapy: Recommended, referral list provided to the patient.  Pt instructed to prioritize therapy.  Pt placed on waitlist for Almarie Ligas. - Education: Patient has been educated on taking the medications as prescribed.  Patient reports that she has not been taking the medication has been taken as she feels which she was  educated on the negative impacts if not staying consistent on medications.  Patient has not been educated on medications, side effects, adverse reactions, and purpose.  Patient has been educated on how to reach the provider through my chart messaging as well as calling the clinic. - Follow-Up: Patient will follow up in 2 weeks in person - Referrals: Patient has been given a referral list for local therapist available while waiting for Almarie Ligas. - Safety Planning: The patient has been educated, if they should have suicidal thoughts with or without a plan to call 911, or go to the closest emergency department.  Pt verbalized understanding.  Pt denies firearms within the home.  Pt also agrees to call the clinic should they have worsening symptoms before the next appointment.  Patient/Guardian was advised Release of Information must be obtained prior to any record release in order to collaborate their care with an outside provider. Patient/Guardian was advised if they have not already done so to contact the registration department to sign all necessary forms in order for us  to release information regarding their care.   Consent: Patient/Guardian gives verbal consent for treatment and assignment of benefits for services provided during this visit. Patient/Guardian expressed understanding and agreed to proceed.    Dorn Jama Der, NP 12/25/2023, 9:39 AM

## 2024-01-08 ENCOUNTER — Ambulatory Visit: Admitting: Psychiatry

## 2024-01-15 ENCOUNTER — Ambulatory Visit: Admitting: Psychiatry

## 2024-01-21 ENCOUNTER — Ambulatory Visit: Admitting: Psychiatry

## 2024-01-28 ENCOUNTER — Ambulatory Visit: Admitting: Psychiatry

## 2024-01-28 ENCOUNTER — Encounter: Payer: Self-pay | Admitting: Psychiatry

## 2024-01-28 VITALS — BP 120/80 | HR 105 | Temp 98.6°F | Ht 59.0 in | Wt 130.0 lb

## 2024-01-28 DIAGNOSIS — F411 Generalized anxiety disorder: Secondary | ICD-10-CM

## 2024-01-28 DIAGNOSIS — F331 Major depressive disorder, recurrent, moderate: Secondary | ICD-10-CM

## 2024-01-28 DIAGNOSIS — F424 Excoriation (skin-picking) disorder: Secondary | ICD-10-CM | POA: Diagnosis not present

## 2024-01-28 DIAGNOSIS — F4310 Post-traumatic stress disorder, unspecified: Secondary | ICD-10-CM

## 2024-01-28 MED ORDER — FLUVOXAMINE MALEATE 100 MG PO TABS: 200.0000 mg | ORAL_TABLET | Freq: Every day | ORAL | 0 refills | Status: AC

## 2024-01-28 NOTE — Progress Notes (Addendum)
 [facility] MD/PA/NP OP Progress Note  01/28/2024 9:36 AM Joanna Mendoza  MRN:  [id number]  Chief Complaint:  Chief Complaint  Patient presents with   Follow-up   HPI: 78 female presenting [facility] for follow-up.  Patient reports she continues to pick at her nose with the mask covering her nose out of concern.  Patient reports that she continues to pick on her nose due to the fact that she continues to have worries about her son in law regarding her husband and how they spend time together.  Patient reports her husband continues to be alcohol  free and has been maintaining sobriety while attending AA.  Patient reports the focus of her anxiety revolves around the fact he spends a lot of time with his sons and states that old anxiety is coming from the fact that her son is having to be a bad influence on the father.  Patient was asked about the rationale behind the son and she states that the son is a drug addict and alcoholic therefore mice husband will become a alcoholic and drug addict.  Patient was reminded that patient husband is to maintain sobriety in which she agreed.  Patient was asked to practice exercises of radical acceptance in which she was shared the exercise of raining on the many thoughts that come when it rains.  She was then reminded of what is like when to just accept it when it rains in which she stated understanding and practiced exercise with this provider.  Patient was reminded that the relationship between the father and the son may be important to him and that she should work on communication patterns and trying to focus on her relationship with her husband and try to avoid accusing the son of being a bad person.  Patient also stated that she wants to work on several relationship concerns in which she wants to share a bed with him at night in which she was encouraged to do so and also asked to ask the husband for 1 day a week to go out to have dinner in which she verbalized she would very much  like.  Patient verbalized understanding patient is also reminded that she is on a wait list for Almarie Ligas here at [facility] to start therapy in which this is looking like a possibility in November or December or January.  Patient verbalized understanding.  Patient is in agreement with treatment plan.  Patient is to increase fluoxetine to 200 mg once daily.  Patient has been encouraged to parent at the [facility] so she can stay on track of her medications.  Patient will continue other medications as prescribed and see plan.  Patient with no other concerns or questions at this time.  Patient denies SI, HI, AVH.  Patient to follow up in 2 weeks. Visit Diagnosis:    ICD-10-CM   1. Moderate episode of recurrent major depressive disorder (HCC)  F33.1     2. Generalized anxiety disorder  F41.1     3. PTSD (post-traumatic stress disorder)  F43.10     4. 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  Outpatient:  Per chart  review, her diagnosis includes depression, bipolar and personality disorder, with borderline histrionic traits. Dr, Chipper, Dr. Beverley, last seen a few months ago - Previously under the care of Dr. Vickey patient reports that she just stopped coming to her appointments due to feeling that she was not a good fit for her care.  Patient reports that this was her choice to stop the visits. Psychiatry admission:  multiple of times, last in 11/2013 for depression with relapse in alcohol  use in the context of conflict with her step son Previous suicide attempt: denies Past trials of medication: sertraline, fluoxetine, lexapro, Abilify, (don't remember)  History of violence:   Legal: denies  DUI, DWI  Past Medical History:  Past Medical History:  Diagnosis Date   Anxiety    Anxiety    Arthritis    Cognitive impairment    Colon polyps    DDD (degenerative disc disease), lumbar    Depression    Diverticulosis    Duodenal mass    Epigastric pain    Fatty liver    Frequent PVCs    GERD (gastroesophageal reflux disease)    History of chicken pox    Hypertension    IBS (irritable bowel syndrome)    Morbid obesity (HCC)    Nausea    OCD (obsessive compulsive disorder)    Osteopenia    Pre-diabetes    PSVT (paroxysmal supraventricular tachycardia) (HCC)    Renal cyst    Restless legs    Status post rotator cuff repair    Stroke [facility])    age 78   Suicidal ideation     Past Surgical History:  Procedure Laterality Date   ABDOMINAL HYSTERECTOMY     benign Left    breast mass removed   BREAST EXCISIONAL BIOPSY Left 2002   neg   BREAST SURGERY Left 2002   Breast Biopsy   CHOLECYSTECTOMY  2011   COLONOSCOPY N/A 11/27/2021   Procedure: COLONOSCOPY;  Surgeon: Maryruth Ole DASEN, MD;  Location: [facility] ENDOSCOPY;  Service: Endoscopy;  Laterality: N/A;   COLONOSCOPY WITH ESOPHAGOGASTRODUODENOSCOPY (EGD)     COLONOSCOPY WITH PROPOFOL  N/A 08/07/2017   Procedure: COLONOSCOPY WITH PROPOFOL ;  Surgeon: Viktoria Lamar DASEN, MD;  Location: [facility] ENDOSCOPY;  Service: Endoscopy;  Laterality: N/A;   ddd     ESOPHAGOGASTRODUODENOSCOPY (EGD) WITH PROPOFOL  N/A 08/07/2017   Procedure: ESOPHAGOGASTRODUODENOSCOPY (EGD) WITH PROPOFOL ;  Surgeon: Viktoria Lamar DASEN, MD;  Location: [facility] ENDOSCOPY;  Service: Endoscopy;  Laterality: N/A;   FOOT SURGERY     KNEE ARTHROPLASTY Left 04/11/2022   Procedure: COMPUTER ASSISTED TOTAL KNEE ARTHROPLASTY;  Surgeon: Mardee Lynwood SQUIBB, MD;  Location: [facility] ORS;  Service: Orthopedics;  Laterality: Left;   KNEE ARTHROSCOPY Left 2008   LAPAROSCOPIC BILATERAL SALPINGO OOPHERECTOMY     OOPHORECTOMY     SHOULDER ACROMIOPLASTY Right 04/06/2015   Procedure:  SUBACROMIAL DECOMPRESSION, ROTATOR CUFF REPAIR;  Surgeon: Lynwood SQUIBB Mardee, MD;  Location: [facility] ORS;  Service: Orthopedics;  Laterality: Right;    Family Psychiatric History: No Additional  Family History:  Family History  Problem Relation Age of Onset   Depression Mother    Depression Sister    Kidney disease Sister    Kidney failure Sister    Depression Brother    Breast cancer Neg Hx    Kidney cancer Neg Hx    Prostate cancer Neg Hx     Social History:  Social History   Socioeconomic History   Marital status: Married    Spouse name:  Not on file   Number of children: 3   Years of education: Not on file   Highest education level: 10th grade  Occupational History   Not on file  Tobacco Use   Smoking status: Never    Passive exposure: Never   Smokeless tobacco: Never  Vaping Use   Vaping status: Never Used  Substance and Sexual Activity   Alcohol  use: Yes    Comment: occ.   Drug use: No   Sexual activity: Not Currently  Other Topics Concern   Not on file  Social History Narrative   Not on file   Social Drivers of Health   Financial Resource Strain: Low Risk  (10/24/2023)   [facility]   Overall Financial Resource Strain (CARDIA)    Difficulty of Paying Living Expenses: Not hard at all  Food Insecurity: No Food Insecurity (10/24/2023)   Received from [facility]   Hunger Vital Sign    Within the past 12 months, you worried that your food would run out before you got the money to buy more.: Never true    Within the past 12 months, the food you bought just didn't last and you didn't have money to get more.: Never true  Transportation Needs: No Transportation Needs (10/24/2023)   Received from [facility] - Transportation    In the past 12 months, has lack of transportation kept you from medical appointments or from getting medications?: No    Lack of Transportation (Non-Medical): No   Physical Activity: Not on file  Stress: Not on file  Social Connections: Not on file    Allergies:  Allergies  Allergen Reactions   Celexa [Citalopram] Other (See Comments)    unknown   Cogentin [Benztropine] Other (See Comments)    unknown   Flagyl [Metronidazole] Other (See Comments)    unknown   Hydrocodone      unsure   Lorazepam  Other (See Comments)    Worsening in irritability   Adhesive [Tape] Rash    Ok to use paper tape    Metabolic Disorder Labs: Lab Results  Component Value Date   HGBA1C 6.2 (H) 04/02/2022   MPG 131.24 04/02/2022   No results found for: PROLACTIN Lab Results  Component Value Date   CHOL 210 (H) 01/03/2021   TRIG 258 (H) 01/03/2021   HDL 45 01/03/2021   CHOLHDL 4.7 01/03/2021   VLDL 52 (H) 01/03/2021   LDLCALC 113 (H) 01/03/2021   Lab Results  Component Value Date   TSH 3.312 12/19/2020   TSH 1.88 11/08/2012    Therapeutic Level Labs: No results found for: LITHIUM No results found for: VALPROATE No results found for: CBMZ  Current Medications: Current Outpatient Medications  Medication Sig Dispense Refill   calcium  carbonate (OS-CAL) 600 MG TABS tablet Take 600 mg by mouth daily.     Cholecalciferol  (VITAMIN D3 PO) Take 1 tablet by mouth daily.     Cyanocobalamin  (B-12 PO) Take 1 tablet by mouth daily.     diclofenac Sodium (VOLTAREN) 1 % GEL Apply 2 g topically.     estradiol  (ESTRACE ) 0.1 MG/GM vaginal cream Insert fingertip unit amount twice weekly for maintenance     fluvoxaMINE  (LUVOX ) 100 MG tablet Take 1 tablet (100 mg total) by mouth at bedtime. 90 tablet 1   hydroxypropyl methylcellulose / hypromellose (ISOPTO TEARS / GONIOVISC) 2.5 % ophthalmic solution Place 1 drop into both eyes as needed for dry  eyes.     hydrOXYzine  (ATARAX ) 10 MG tablet Take 1 tablet (10 mg total) by mouth 3 (three) times daily as needed for anxiety. 90 tablet 1   meclizine  (ANTIVERT ) 25 MG tablet Take by mouth.     melatonin 10 MG  TABS Take 10 mg by mouth at bedtime. 30 tablet 1   meloxicam (MOBIC) 15 MG tablet Take 15 mg by mouth daily.     omeprazole (PRILOSEC) 40 MG capsule Take 40 mg by mouth daily.     Pyridoxine  HCl (B-6 PO) Take 1 tablet by mouth daily.     traMADol  (ULTRAM ) 50 MG tablet Take 50 mg by mouth every 6 (six) hours as needed for moderate pain (pain score 4-6).     traZODone  (DESYREL ) 100 MG tablet Take 1 tablet (100 mg total) by mouth at bedtime as needed for sleep. 90 tablet 1   trospium  (SANCTURA ) 20 MG tablet Take 20 mg by mouth at bedtime.     Trospium  Chloride 60 MG CP24 Take 1 capsule (60 mg total) by mouth daily. 30 capsule 11   No current facility-administered medications for this visit.     Musculoskeletal: Strength & Muscle Tone: within normal limits Gait & Station: normal Patient leans: Right  Psychiatric Specialty Exam: Review of Systems  Constitutional: Negative.   HENT: Negative.    Eyes: Negative.   Respiratory: Negative.    Cardiovascular: Negative.   Gastrointestinal: Negative.   Endocrine: Negative.   Genitourinary: Negative.   Musculoskeletal: Negative.   Skin: Negative.   Allergic/Immunologic: Negative.   Neurological: Negative.   Hematological: Negative.   Psychiatric/Behavioral:  Positive for behavioral problems. The patient is nervous/anxious.     Blood pressure 120/80, pulse (!) 105, temperature 98.6 F (37 C), temperature source Temporal, height 4' 11 (1.499 m), weight 130 lb (59 kg), SpO2 96%.Body mass index is 26.26 kg/m.  General Appearance: Well Groomed  Eye Contact:  Good  Speech:  Clear and Coherent  Volume:  Normal  Mood:  Anxious  Affect:  Appropriate  Thought Process:  Coherent  Orientation:  Full (Time, Place, and Person)  Thought Content: Logical and Obsessions   Suicidal Thoughts:  No  Homicidal Thoughts:  No  Memory:  Immediate;   Good Recent;   Good Remote;   Good  Judgement:  Fair  Insight:  Fair  Psychomotor Activity:  Normal   Concentration:  Concentration: Good and Attention Span: Good  Recall:  Good  Fund of Knowledge: Good  Language: Good  Akathisia:  No  Handed:  Right  AIMS (if indicated):   Assets:  Desire for Improvement Financial Resources/Insurance Housing  ADL's:  Intact  Cognition: WNL  Sleep:  Good   Screenings: AUDIT    Flowsheet Row Admission (Discharged) from 12/28/2020 in [facility]  Alcohol  Use Disorder Identification Test Final Score (AUDIT) 0   GAD-7    Flowsheet Row Office Visit from 12/25/2023 in [facility] Office Visit from 12/11/2023 in [facility] Office Visit from 11/27/2023 in [facility]  Total GAD-7 Score 16 12 21    PHQ2-9    Flowsheet Row Office Visit from 12/25/2023 in [facility] Office Visit from 12/11/2023 in [facility] Office Visit from 11/27/2023 in [facility] Office Visit from 08/01/2021 in confidential department Office Visit from 07/11/2021 in confidential department  PHQ-2 Total Score 4 5  6 6 2   PHQ-9 Total Score 9 9 22 20 6    Flowsheet Row Office Visit from 12/25/2023 in [facility] Office Visit from 12/11/2023 in [facility] Office Visit from 11/27/2023 in [facility]  C-SSRS RISK CATEGORY No Risk No Risk Low Risk     Assessment and Plan:  Assessment - Diagnosis: Moderate episode of recurrent major depressive disorder (HCC) [F33.1]   2. PTSD (post-traumatic stress disorder) [F43.10]  3. Excoriation (skin-picking) disorder [F42.4]   - Progress: Pt reports good improvement in the home with husband, reports he is not drinking any longer.  - Risk Factors: Worsening symptoms, OCD  tendencies.   Plan - Medications:  Increase Fluvox 200mg  once daily in the morning, for depression and excoriation tendencies. Continue Trazadone 100mg  once daily before bed as needed for sleep. Continue Hydroxyzine  10mg  TID as needed. Pt educated of sedating nature when initially starting the medication.   - Psychotherapy: Recommended, referral list provided to the patient.  Pt instructed to prioritize therapy.  Pt placed on waitlist for Almarie Ligas. - Education: Patient has been educated on taking the medications as prescribed.  Patient reports that she has not been taking the medication has been taken as she feels which she was educated on the negative impacts if not staying consistent on medications.  Patient has not been educated on medications, side effects, adverse reactions, and purpose.  Patient has been educated on how to reach the provider through my chart messaging as well as calling the clinic. - Follow-Up: Patient will follow up in 2 weeks in person - Referrals: Patient has been given a referral list for local therapist available while waiting for Almarie Ligas. - Safety Planning: The patient has been educated, if they should have suicidal thoughts with or without a plan to call 911, or go to the closest emergency department.  Pt verbalized understanding.  Pt denies firearms within the home.  Pt also agrees to call the clinic should they have worsening symptoms before the next appointment.   Patient/Guardian was advised Release of Information must be obtained prior to any record release in order to collaborate their care with an outside provider. Patient/Guardian was advised if they have not already done so to contact the registration department to sign all necessary forms in order for us  to release information regarding their care.   Consent: Patient/Guardian gives verbal consent for treatment and assignment of benefits for services provided during this visit. Patient/Guardian  expressed understanding and agreed to proceed.    Dorn Jama Der, NP 01/28/2024, 9:36 AM

## 2024-02-12 ENCOUNTER — Encounter: Payer: Self-pay | Admitting: Psychiatry

## 2024-02-12 ENCOUNTER — Ambulatory Visit: Admitting: Psychiatry

## 2024-02-12 VITALS — BP 118/70 | HR 118 | Temp 98.5°F | Ht 59.0 in | Wt 130.4 lb

## 2024-02-12 DIAGNOSIS — F4310 Post-traumatic stress disorder, unspecified: Secondary | ICD-10-CM

## 2024-02-12 DIAGNOSIS — F424 Excoriation (skin-picking) disorder: Secondary | ICD-10-CM | POA: Diagnosis not present

## 2024-02-12 DIAGNOSIS — F411 Generalized anxiety disorder: Secondary | ICD-10-CM | POA: Diagnosis not present

## 2024-02-12 DIAGNOSIS — F331 Major depressive disorder, recurrent, moderate: Secondary | ICD-10-CM | POA: Diagnosis not present

## 2024-02-12 NOTE — Progress Notes (Unsigned)
 BH MD/PA/NP OP Progress Note  02/12/2024 2:48 PM Joanna Mendoza  MRN:  [id number]  Chief Complaint:  Chief Complaint  Patient presents with   Follow-up   HPI: 78 year old female presenting [facility] for follow-up.  Patient is is observed picking at her skin stating that she is experiencing a lot of stress and is planning on moving out of her house.  Patient reports that her husband has been drinking again stating that she is angry and does not think she can stay at home with him any longer.  Patient reports that she is planning on going to the department store to buy furniture and to move out to her daughter's place in order to get away from her husband.  Patient does report though that the husband did stop talking to his son Jodie but patient states that she is still feeling anxious in which she feels the need to have to move out to make sure her husband understands that she is serious.  Patient reports the medications are going well patient was reminded fluvoxamine  is being utilized for OCD tendencies in which she was encouraged that since she is still picking at her arms and nose to wear longsleeve shirts and to utilize the previous exercises of riding the wave of the urge as well as other breathing and grounding exercises to help reorient and which she verbalized she is just too anxious to practice skills.  Patient is currently waiting for a spot with Almarie Ligas at [facility] for OCD therapy.  Patient is compliant with medication and continues to take medications as prescribed and schedule.  No modifications to medical medications at this time.  Patient has been encouraged to be patient with her husband as relapse does happen at times and that to be supportive of the has not been in which she states that she is too anxious and upset regarding her husband's drinking again.  Patient has been encouraged to continue medications and has been encouraged to continue appointments for this provider check up on her  in which she has requested to be seen weekly depressive thoughts and anxiety.  Patient with no other questions or concerns.  Patient is in agreement with treatment plan.  Patient denies SI, HI, AVH.  Patient to follow-up in 2 weeks. Visit Diagnosis:    ICD-10-CM   1. Moderate episode of recurrent major depressive disorder (HCC)  F33.1     2. Generalized anxiety disorder  F41.1     3. PTSD (post-traumatic stress disorder)  F43.10     4. 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  Outpatient:  Per chart review, her diagnosis includes depression, bipolar and personality disorder, with borderline histrionic traits. Dr, Chipper, Dr. Beverley, last seen a few months ago - Previously under the care of Dr. Vickey patient reports that she just stopped coming to her appointments due to feeling that she was not a good fit for her care.  Patient reports that this was her choice to stop the visits. Psychiatry admission:  multiple of times, last in 11/2013 for depression with relapse in alcohol  use in the context of conflict with her step  son Previous suicide attempt: denies Past trials of medication: sertraline, fluoxetine, lexapro, Abilify, (don't remember)  History of violence:   Legal: denies DUI, DWI  Past Medical History:  Past Medical History:  Diagnosis Date   Anxiety    Anxiety    Arthritis    Cognitive impairment    Colon polyps    DDD (degenerative disc disease), lumbar    Depression    Diverticulosis    Duodenal mass    Epigastric pain    Fatty liver    Frequent PVCs    GERD (gastroesophageal reflux disease)    History of chicken pox    Hypertension    IBS (irritable  bowel syndrome)    Morbid obesity (HCC)    Nausea    OCD (obsessive compulsive disorder)    Osteopenia    Pre-diabetes    PSVT (paroxysmal supraventricular tachycardia) (HCC)    Renal cyst    Restless legs    Status post rotator cuff repair    Stroke [facility])    age 50   Suicidal ideation     Past Surgical History:  Procedure Laterality Date   ABDOMINAL HYSTERECTOMY     benign Left    breast mass removed   BREAST EXCISIONAL BIOPSY Left 2002   neg   BREAST SURGERY Left 2002   Breast Biopsy   CHOLECYSTECTOMY  2011   COLONOSCOPY N/A 11/27/2021   Procedure: COLONOSCOPY;  Surgeon: Maryruth Ole DASEN, MD;  Location: [facility] ENDOSCOPY;  Service: Endoscopy;  Laterality: N/A;   COLONOSCOPY WITH ESOPHAGOGASTRODUODENOSCOPY (EGD)     COLONOSCOPY WITH PROPOFOL  N/A 08/07/2017   Procedure: COLONOSCOPY WITH PROPOFOL ;  Surgeon: Viktoria Lamar DASEN, MD;  Location: [facility] ENDOSCOPY;  Service: Endoscopy;  Laterality: N/A;   ddd     ESOPHAGOGASTRODUODENOSCOPY (EGD) WITH PROPOFOL  N/A 08/07/2017   Procedure: ESOPHAGOGASTRODUODENOSCOPY (EGD) WITH PROPOFOL ;  Surgeon: Viktoria Lamar DASEN, MD;  Location: [facility] ENDOSCOPY;  Service: Endoscopy;  Laterality: N/A;   FOOT SURGERY     KNEE ARTHROPLASTY Left 04/11/2022   Procedure: COMPUTER ASSISTED TOTAL KNEE ARTHROPLASTY;  Surgeon: Mardee Lynwood SQUIBB, MD;  Location: [facility] ORS;  Service: Orthopedics;  Laterality: Left;   KNEE ARTHROSCOPY Left 2008   LAPAROSCOPIC BILATERAL SALPINGO OOPHERECTOMY     OOPHORECTOMY     SHOULDER ACROMIOPLASTY Right 04/06/2015   Procedure: SUBACROMIAL DECOMPRESSION, ROTATOR CUFF REPAIR;  Surgeon: Lynwood SQUIBB Mardee, MD;  Location: [facility] ORS;  Service: Orthopedics;  Laterality: Right;    Family Psychiatric History: No additional  Family History:  Family History  Problem Relation Age of Onset   Depression Mother    Depression Sister    Kidney disease Sister    Kidney failure Sister    Depression Brother    Breast cancer Neg Hx    Kidney cancer  Neg Hx    Prostate cancer Neg Hx     Social History:  Social History   Socioeconomic History   Marital status: Married    Spouse name: Not on file   Number of children: 3   Years of education: Not on file   Highest education level: 10th grade  Occupational History   Not on file  Tobacco Use   Smoking status: Never    Passive exposure: Never   Smokeless tobacco: Never  Vaping Use   Vaping status: Never Used  Substance and Sexual Activity   Alcohol  use: Yes    Comment: occ.   Drug use: No   Sexual activity: Not Currently  Other Topics Concern  Not on file  Social History Narrative   Not on file   Social Drivers of Health   Financial Resource Strain: Low Risk  (10/24/2023)   Received from [facility]   Overall Financial Resource Strain (CARDIA)    Difficulty of Paying Living Expenses: Not hard at all  Food Insecurity: No Food Insecurity (10/24/2023)   Received from [facility]   Hunger Vital Sign    Within the past 12 months, you worried that your food would run out before you got the money to buy more.: Never true    Within the past 12 months, the food you bought just didn't last and you didn't have money to get more.: Never true  Transportation Needs: No Transportation Needs (10/24/2023)   Received from [facility] - Transportation    In the past 12 months, has lack of transportation kept you from medical appointments or from getting medications?: No    Lack of Transportation (Non-Medical): No  Physical Activity: Not on file  Stress: Not on file  Social Connections: Not on file    Allergies:  Allergies  Allergen Reactions   Celexa [Citalopram] Other (See Comments)    unknown   Cogentin [Benztropine] Other (See Comments)    unknown   Flagyl [Metronidazole] Other (See Comments)    unknown   Hydrocodone      unsure   Lorazepam  Other (See Comments)    Worsening in irritability   Adhesive  [Tape] Rash    Ok to use paper tape    Metabolic Disorder Labs: Lab Results  Component Value Date   HGBA1C 6.2 (H) 04/02/2022   MPG 131.24 04/02/2022   No results found for: PROLACTIN Lab Results  Component Value Date   CHOL 210 (H) 01/03/2021   TRIG 258 (H) 01/03/2021   HDL 45 01/03/2021   CHOLHDL 4.7 01/03/2021   VLDL 52 (H) 01/03/2021   LDLCALC 113 (H) 01/03/2021   Lab Results  Component Value Date   TSH 3.312 12/19/2020   TSH 1.88 11/08/2012    Therapeutic Level Labs: No results found for: LITHIUM No results found for: VALPROATE No results found for: CBMZ  Current Medications: Current Outpatient Medications  Medication Sig Dispense Refill   calcium  carbonate (OS-CAL) 600 MG TABS tablet Take 600 mg by mouth daily.     Cholecalciferol  (VITAMIN D3 PO) Take 1 tablet by mouth daily.     Cyanocobalamin  (B-12 PO) Take 1 tablet by mouth daily.     diclofenac Sodium (VOLTAREN) 1 % GEL Apply 2 g topically.     estradiol  (ESTRACE ) 0.1 MG/GM vaginal cream Insert fingertip unit amount twice weekly for maintenance     fluvoxaMINE  (LUVOX ) 100 MG tablet Take 2 tablets (200 mg total) by mouth at bedtime. 30 tablet 0   hydroxypropyl methylcellulose / hypromellose (ISOPTO TEARS / GONIOVISC) 2.5 % ophthalmic solution Place 1 drop into both eyes as needed for dry eyes.     hydrOXYzine  (ATARAX ) 10 MG tablet Take 1 tablet (10 mg total) by mouth 3 (three) times daily as needed for anxiety. 90 tablet 1   meclizine  (ANTIVERT ) 25 MG tablet Take by mouth.     melatonin 10 MG TABS Take 10 mg by mouth at bedtime. 30 tablet 1   meloxicam (MOBIC) 15 MG tablet Take 15 mg by mouth daily.     omeprazole (PRILOSEC) 40 MG capsule Take 40 mg by mouth daily.     Pyridoxine  HCl (B-6  PO) Take 1 tablet by mouth daily.     traMADol  (ULTRAM ) 50 MG tablet Take 50 mg by mouth every 6 (six) hours as needed for moderate pain (pain score 4-6).     traZODone  (DESYREL ) 100 MG tablet Take 1 tablet (100 mg  total) by mouth at bedtime as needed for sleep. 90 tablet 1   trospium  (SANCTURA ) 20 MG tablet Take 20 mg by mouth at bedtime.     Trospium  Chloride 60 MG CP24 Take 1 capsule (60 mg total) by mouth daily. 30 capsule 11   No current facility-administered medications for this visit.     Musculoskeletal: Strength & Muscle Tone: within normal limits Gait & Station: normal Patient leans: Right   Psychiatric Specialty Exam: Review of Systems  Constitutional: Negative.   HENT: Negative.    Eyes: Negative.   Respiratory: Negative.    Cardiovascular: Negative.   Gastrointestinal: Negative.   Endocrine: Negative.   Genitourinary: Negative.   Musculoskeletal: Negative.   Skin: Negative.   Allergic/Immunologic: Negative.   Neurological: Negative.   Hematological: Negative.   Psychiatric/Behavioral:  Positive for behavioral problems. The patient is nervous/anxious.     Blood pressure 118/70, pulse (!) 118, temperature 98.5 F (36.9 C), temperature source Temporal, height 4' 11 (1.499 m), weight 130 lb 6.4 oz (59.1 kg), SpO2 96%.Body mass index is 26.34 kg/m.  General Appearance: Well Groomed  Eye Contact:  Good  Speech:  Clear and Coherent  Volume:  Normal  Mood:  Angry, Anxious, and Depressed  Affect:  Appropriate  Thought Process:  Coherent  Orientation:  Full (Time, Place, and Person)  Thought Content: Obsessions   Suicidal Thoughts:  No  Homicidal Thoughts:  No  Memory:  Immediate;   Good Recent;   Good Remote;   Good  Judgement:  Good  Insight:  Good  Psychomotor Activity:  Normal  Concentration:  Concentration: Good and Attention Span: Good  Recall:  Good  Fund of Knowledge: Good  Language: Good  Akathisia:  Yes  Handed:  Right  AIMS (if indicated): not done  Assets:  Desire for Improvement Financial Resources/Insurance Housing  ADL's:  Intact  Cognition: WNL  Sleep:  Good   Screenings: AUDIT    Flowsheet Row Admission (Discharged) from 12/28/2020 in [facility]  INPATIENT BEHAVIORAL MEDICINE  Alcohol  Use Disorder Identification Test Final Score (AUDIT) 0   GAD-7    Flowsheet Row Office Visit from 01/28/2024 in [facility] Office Visit from 12/25/2023 in [facility] Office Visit from 12/11/2023 in [facility] Office Visit from 11/27/2023 in [facility]  Total GAD-7 Score 10 16 12 21    PHQ2-9    Flowsheet Row Office Visit from 01/28/2024 in [facility] Office Visit from 12/25/2023 in [facility] Office Visit from 12/11/2023 in [facility] Office Visit from 11/27/2023 in [facility] Office Visit from 08/01/2021 in confidential department  PHQ-2 Total Score 3 4 5 6 6   PHQ-9 Total Score 6 9 9 22 20    Flowsheet Row Office Visit from 12/25/2023 in [facility] Office Visit from 12/11/2023 in [facility] Office Visit from 11/27/2023 in [facility]  C-SSRS RISK CATEGORY No Risk No Risk Low Risk     Assessment and Plan:  Assessment - Diagnosis: Moderate  episode of recurrent major depressive disorder (HCC) [F33.1]   2. PTSD (post-traumatic stress disorder) [F43.10]  3. Excoriation (skin-picking) disorder [F42.4]   - Progress: Pt reports good improvement in the home with husband, reports he is not drinking any longer.  - Risk Factors: Worsening symptoms, OCD tendencies.   Plan - Medications:  Increase Fluvox 200mg  once daily in the morning, for depression and excoriation tendencies. Continue Trazadone 100mg  once daily before bed as needed for sleep. Continue Hydroxyzine  10mg  TID as needed. Pt educated of sedating nature when  initially starting the medication.   - Psychotherapy: Recommended, referral list provided to the patient.  Pt instructed to prioritize therapy.  Pt placed on waitlist for Almarie Ligas. - Education: Patient has been educated on taking the medications as prescribed.  Patient reports that she has not been taking the medication has been taken as she feels which she was educated on the negative impacts if not staying consistent on medications.  Patient has not been educated on medications, side effects, adverse reactions, and purpose.  Patient has been educated on how to reach the provider through my chart messaging as well as calling the clinic. - Follow-Up: Patient will follow up in 2 weeks in person - Referrals: Patient has been given a referral list for local therapist available while waiting for Almarie Ligas. - Safety Planning: The patient has been educated, if they should have suicidal thoughts with or without a plan to call 911, or go to the closest emergency department.  Pt verbalized understanding.  Pt denies firearms within the home.  Pt also agrees to call the clinic should they have worsening symptoms before the next appointment.  Patient/Guardian was advised Release of Information must be obtained prior to any record release in order to collaborate their care with an outside provider. Patient/Guardian was advised if they have not already done so to contact the registration department to sign all necessary forms in order for us  to release information regarding their care.   Consent: Patient/Guardian gives verbal consent for treatment and assignment of benefits for services provided during this visit. Patient/Guardian expressed understanding and agreed to proceed.    Dorn Jama Der, NP 02/12/2024, 2:48 PM

## 2024-02-26 ENCOUNTER — Ambulatory Visit: Admitting: Psychiatry

## 2024-03-13 ENCOUNTER — Emergency Department

## 2024-03-13 ENCOUNTER — Emergency Department
Admission: EM | Admit: 2024-03-13 | Discharge: 2024-03-13 | Disposition: A | Discharge status: Home / Self Care | Source: Ambulatory Visit | Attending: Emergency Medicine | Admitting: Emergency Medicine

## 2024-03-13 ENCOUNTER — Other Ambulatory Visit: Payer: Self-pay

## 2024-03-13 DIAGNOSIS — Y93E5 Activity, floor mopping and cleaning: Secondary | ICD-10-CM | POA: Diagnosis not present

## 2024-03-13 DIAGNOSIS — S300XXA Contusion of lower back and pelvis, initial encounter: Secondary | ICD-10-CM | POA: Insufficient documentation

## 2024-03-13 DIAGNOSIS — Y92009 Unspecified place in unspecified non-institutional (private) residence as the place of occurrence of the external cause: Secondary | ICD-10-CM | POA: Insufficient documentation

## 2024-03-13 DIAGNOSIS — Z9181 History of falling: Secondary | ICD-10-CM | POA: Diagnosis not present

## 2024-03-13 DIAGNOSIS — M48061 Spinal stenosis, lumbar region without neurogenic claudication: Secondary | ICD-10-CM | POA: Diagnosis not present

## 2024-03-13 DIAGNOSIS — W0110XA Fall on same level from slipping, tripping and stumbling with subsequent striking against unspecified object, initial encounter: Secondary | ICD-10-CM | POA: Insufficient documentation

## 2024-03-13 DIAGNOSIS — Z043 Encounter for examination and observation following other accident: Secondary | ICD-10-CM | POA: Diagnosis not present

## 2024-03-13 DIAGNOSIS — M545 Low back pain, unspecified: Secondary | ICD-10-CM | POA: Diagnosis present

## 2024-03-13 DIAGNOSIS — S30810A Abrasion of lower back and pelvis, initial encounter: Secondary | ICD-10-CM | POA: Diagnosis not present

## 2024-03-13 DIAGNOSIS — M4317 Spondylolisthesis, lumbosacral region: Secondary | ICD-10-CM | POA: Diagnosis not present

## 2024-03-13 DIAGNOSIS — R412 Retrograde amnesia: Secondary | ICD-10-CM | POA: Diagnosis not present

## 2024-03-13 DIAGNOSIS — M419 Scoliosis, unspecified: Secondary | ICD-10-CM | POA: Diagnosis not present

## 2024-03-13 NOTE — ED Provider Notes (Signed)
 [facility] Provider Note    Event Date/Time   First MD Initiated Contact with Patient 03/13/24 1221     (approximate)   History   Fall   HPI  Joanna Mendoza is a 78 y.o. female  with PMH of DDD, anxiety, OCD who presents for evaluation after a fall. Patient was cleaning her house yesterday and lost her balance falling backward into something. She did not hit her head, did not lose consciousness. Does have pain to the left lower back.  She states she did not feel dizzy, light headed or weak before her fall.       Physical Exam   Triage Vital Signs: ED Triage Vitals  Encounter Vitals Group     BP 03/13/24 1207 (!) 151/76     Girls Systolic BP Percentile --      Girls Diastolic BP Percentile --      Boys Systolic BP Percentile --      Boys Diastolic BP Percentile --      Pulse Rate 03/13/24 1207 78     Resp 03/13/24 1207 20     Temp 03/13/24 1207 98 F (36.7 C)     Temp Source 03/13/24 1207 Oral     SpO2 03/13/24 1207 98 %     Weight 03/13/24 1208 132 lb (59.9 kg)     Height 03/13/24 1208 4' 11 (1.499 m)     Head Circumference --      Peak Flow --      Pain Score --      Pain Loc --      Pain Education --      Exclude from Growth Chart --     Most recent vital signs: Vitals:   03/13/24 1207  BP: (!) 151/76  Pulse: 78  Resp: 20  Temp: 98 F (36.7 C)  SpO2: 98%   General: Awake, no distress.  CV:  Good peripheral perfusion.  Resp:  Normal effort.  Abd:  No distention.  Other:  Abrasion with surrounding bruising and tenderness to palpation on left lower back, no point tenderness in spine, walks without difficulty   ED Results / Procedures / Treatments   Labs (all labs ordered are listed, but only abnormal results are displayed) Labs Reviewed - No data to display  RADIOLOGY  Lumbar xray obtained, I interpreted the images as well as reviewed the radiologist report, which was negative for fractures.   IMPRESSION:  1. No  acute fracture or dislocation.  2. 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  3. Mild intervertebral disc space narrowing at L3-4.  4. Advanced intervertebral disc space narrowing at L4-5.  5. Mild anterolisthesis of L5 on S1, likely degenerative    PROCEDURES:  Critical Care performed: No  Procedures   MEDICATIONS ORDERED IN ED: Medications - No data to display   IMPRESSION / MDM / ASSESSMENT AND PLAN / ED COURSE  I reviewed the triage vital signs and the nursing notes.                             78 year old female presents for evaluation after a fall last night. BP is elevated, VSS otherwise. Patient NAD on exam.  Differential diagnosis includes, but is not limited to, compression fracture, muscle strain, contusion, abrasion.  Patient's presentation is most consistent with acute complicated illness / injury requiring diagnostic workup.  Xray of the lumbar spine is  negative for fractures.   Patient does have a contusion and abrasion on the lower left back. She has not taken any medication yet for pain. Did recommend tylenol  and ibuprofen as well as ice and heat. Offered medication while in the ED and patient declined. Patient voiced understanding, all questions answered and she was stable at discharge.      FINAL CLINICAL IMPRESSION(S) / ED DIAGNOSES   Final diagnoses:  Contusion of lower back, initial encounter     Rx / DC Orders   ED Discharge Orders     None        Note:  This document was prepared using Dragon voice recognition software and may include unintentional dictation errors.   Cleaster Tinnie LABOR, PA-C 03/13/24 1255    Viviann Pastor, MD 03/13/24 1935

## 2024-03-13 NOTE — ED Triage Notes (Addendum)
 Pt to Ed via POV from [facility]. Pt reports was cleaning yesterday and stuff all over the carpet and fell hitting back. Pt has bruising to left side of lower back. No LOC. No head trauma. No blood thinner. Pt has abrasions to nose and arms but states its from scratching due to anxiety. Pt A&Ox4 and ambulatory to triage.

## 2024-03-13 NOTE — ED Triage Notes (Signed)
 Sent to ED from [facility].  Fall last night c/o low back pain. Did not hit head. No LOC. Ambulatory. NAD

## 2024-03-13 NOTE — Discharge Instructions (Addendum)
 The xray of your back was negative. You have a contusion which is a bruise on your back.   You can take 650 mg of Tylenol  and 600 mg of ibuprofen every 6 hours as needed for pain. You can use ice, heat, muscle creams and other topical pain relievers as well.  Your pain should improve with time, if things are not getting better over the next few days, please be re-evaluated by your primary care provider.

## 2024-04-27 DIAGNOSIS — E538 Deficiency of other specified B group vitamins: Secondary | ICD-10-CM | POA: Diagnosis not present

## 2024-04-27 DIAGNOSIS — R7303 Prediabetes: Secondary | ICD-10-CM | POA: Diagnosis not present

## 2024-04-27 DIAGNOSIS — E782 Mixed hyperlipidemia: Secondary | ICD-10-CM | POA: Diagnosis not present

## 2024-05-04 DIAGNOSIS — R1011 Right upper quadrant pain: Secondary | ICD-10-CM | POA: Diagnosis not present

## 2024-05-04 DIAGNOSIS — R3129 Other microscopic hematuria: Secondary | ICD-10-CM | POA: Diagnosis not present

## 2024-05-04 DIAGNOSIS — F331 Major depressive disorder, recurrent, moderate: Secondary | ICD-10-CM | POA: Diagnosis not present

## 2024-05-04 DIAGNOSIS — E782 Mixed hyperlipidemia: Secondary | ICD-10-CM | POA: Diagnosis not present

## 2024-05-04 DIAGNOSIS — K760 Fatty (change of) liver, not elsewhere classified: Secondary | ICD-10-CM | POA: Diagnosis not present

## 2024-05-04 DIAGNOSIS — M51362 Other intervertebral disc degeneration, lumbar region with discogenic back pain and lower extremity pain: Secondary | ICD-10-CM | POA: Diagnosis not present

## 2024-05-04 DIAGNOSIS — I471 Supraventricular tachycardia, unspecified: Secondary | ICD-10-CM | POA: Diagnosis not present

## 2024-05-04 DIAGNOSIS — R7303 Prediabetes: Secondary | ICD-10-CM | POA: Diagnosis not present

## 2024-05-04 DIAGNOSIS — Z780 Asymptomatic menopausal state: Secondary | ICD-10-CM | POA: Diagnosis not present

## 2024-05-04 DIAGNOSIS — K529 Noninfective gastroenteritis and colitis, unspecified: Secondary | ICD-10-CM | POA: Diagnosis not present

## 2024-05-04 DIAGNOSIS — I493 Ventricular premature depolarization: Secondary | ICD-10-CM | POA: Diagnosis not present

## 2024-05-04 DIAGNOSIS — G2581 Restless legs syndrome: Secondary | ICD-10-CM | POA: Diagnosis not present

## 2024-05-07 ENCOUNTER — Other Ambulatory Visit: Payer: Self-pay | Admitting: Internal Medicine

## 2024-05-07 DIAGNOSIS — R42 Dizziness and giddiness: Secondary | ICD-10-CM

## 2024-05-07 DIAGNOSIS — R7303 Prediabetes: Secondary | ICD-10-CM

## 2024-05-14 ENCOUNTER — Ambulatory Visit: Admission: RE | Admit: 2024-05-14 | Discharge: 2024-05-14 | Attending: Internal Medicine | Admitting: Internal Medicine

## 2024-05-14 DIAGNOSIS — R7303 Prediabetes: Secondary | ICD-10-CM | POA: Diagnosis present

## 2024-05-14 DIAGNOSIS — R42 Dizziness and giddiness: Secondary | ICD-10-CM | POA: Insufficient documentation

## 2024-06-03 ENCOUNTER — Ambulatory Visit

## 2024-06-03 DIAGNOSIS — R42 Dizziness and giddiness: Secondary | ICD-10-CM | POA: Diagnosis present

## 2024-06-03 DIAGNOSIS — R29818 Other symptoms and signs involving the nervous system: Secondary | ICD-10-CM | POA: Insufficient documentation

## 2024-06-03 DIAGNOSIS — R2681 Unsteadiness on feet: Secondary | ICD-10-CM | POA: Diagnosis present

## 2024-06-03 NOTE — Therapy (Signed)
 " OUTPATIENT PHYSICAL THERAPY EVALUATION  Patient Name: Joanna Mendoza MRN: [id number] DOB:January 08, 1946, 78 y.o., female Today's Date: 06/03/2024  PCP: Fernande Ophelia JINNY DOUGLAS, MD  REFERRING PROVIDER: Legrand Norris, [place]  END OF SESSION:  PT End of Session - 06/03/24 1456     Visit Number 1    Number of Visits 12    Date for Recertification  07/15/24    Authorization Type [employer] medicare    Authorization Time Period 06/03/24-07/15/24    Progress Note Due on Visit 10    PT Start Time 0933    PT Stop Time 1013    PT Time Calculation (min) 40 min    Activity Tolerance Patient tolerated treatment well    Behavior During Therapy WFL for tasks assessed/performed          Past Medical History:  Diagnosis Date   Anxiety    Anxiety    Arthritis    Cognitive impairment    Colon polyps    DDD (degenerative disc disease), lumbar    Depression    Diverticulosis    Duodenal mass    Epigastric pain    Fatty liver    Frequent PVCs    GERD (gastroesophageal reflux disease)    History of chicken pox    Hypertension    IBS (irritable bowel syndrome)    Morbid obesity (HCC)    Nausea    OCD (obsessive compulsive disorder)    Osteopenia    Pre-diabetes    PSVT (paroxysmal supraventricular tachycardia)    Renal cyst    Restless legs    Status post rotator cuff repair    Stroke [facility])    age 66   Suicidal ideation    Past Surgical History:  Procedure Laterality Date   ABDOMINAL HYSTERECTOMY     benign Left    breast mass removed   BREAST EXCISIONAL BIOPSY Left 2002   neg   BREAST SURGERY Left 2002   Breast Biopsy   CHOLECYSTECTOMY  2011   COLONOSCOPY N/A 11/27/2021   Procedure: COLONOSCOPY;  Surgeon: Maryruth Ole DASEN, MD;  Location: [facility] ENDOSCOPY;  Service: Endoscopy;  Laterality: N/A;   COLONOSCOPY WITH ESOPHAGOGASTRODUODENOSCOPY (EGD)     COLONOSCOPY WITH PROPOFOL  N/A 08/07/2017   Procedure: COLONOSCOPY WITH PROPOFOL ;  Surgeon: Viktoria Lamar DASEN, MD;  Location: [facility]  ENDOSCOPY;  Service: Endoscopy;  Laterality: N/A;   ddd     ESOPHAGOGASTRODUODENOSCOPY (EGD) WITH PROPOFOL  N/A 08/07/2017   Procedure: ESOPHAGOGASTRODUODENOSCOPY (EGD) WITH PROPOFOL ;  Surgeon: Viktoria Lamar DASEN, MD;  Location: [facility] ENDOSCOPY;  Service: Endoscopy;  Laterality: N/A;   FOOT SURGERY     KNEE ARTHROPLASTY Left 04/11/2022   Procedure: COMPUTER ASSISTED TOTAL KNEE ARTHROPLASTY;  Surgeon: Mardee Lynwood SQUIBB, MD;  Location: [facility] ORS;  Service: Orthopedics;  Laterality: Left;   KNEE ARTHROSCOPY Left 2008   LAPAROSCOPIC BILATERAL SALPINGO OOPHERECTOMY     OOPHORECTOMY     SHOULDER ACROMIOPLASTY Right 04/06/2015   Procedure: SUBACROMIAL DECOMPRESSION, ROTATOR CUFF REPAIR;  Surgeon: Lynwood SQUIBB Mardee, MD;  Location: [facility] ORS;  Service: Orthopedics;  Laterality: Right;   Patient Active Problem List   Diagnosis Date Noted   Total knee replacement status, left 04/11/2022   Depression    OCD (obsessive compulsive disorder) 03/30/2021   Degenerative disc disease, lumbar 12/30/2020   Osteopenia 12/30/2020   Cognitive impairment 12/30/2020   Severe recurrent major depression without psychotic features (HCC) 12/28/2020   Suicidal ideation 12/28/2020   PTSD (post-traumatic stress disorder) 12/28/2020   Primary  osteoarthritis of left knee 02/09/2019   Renal cyst 07/30/2017   Urge incontinence 07/30/2017   Urinary urgency 07/30/2017   Urinary frequency 07/30/2017   Microhematuria 07/30/2017   Hx of adenomatous colonic polyps 07/08/2017   Other insomnia 11/22/2016   Frequent PVCs 08/22/2016   PSVT (paroxysmal supraventricular tachycardia) 06/27/2016   Chronic RUQ pain 04/01/2014   Mixed hyperlipidemia 12/11/2013   ONSET DATE: chronic problem with exacerbation roughly around September 2025, insidious onset   REFERRING DIAG: dizziness   THERAPY DIAG:  Dizziness and giddiness  Unsteadiness on feet  Other symptoms and signs involving the nervous system  Rationale for Evaluation and  Treatment: Rehabilitation  SUBJECTIVE:                                                                                                                                                                                             SUBJECTIVE STATEMENT: Pt is having dizziness issues again, similar to episode years ago.   PERTINENT HISTORY:   78yoF referred to outpatient vestibular rehab after 2-3 months of dizziness episode with no clear etiology. 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 Pt denies any associated symptoms (neither current, nor fluctuating) in visual changes, headache, nausea, vomiting, neck pain. Pt denies any presyncope, denies any changes to hearing or tinnitus. Pt reports normal diet and fluid intake, denies any recent illness or head trauma, denies falls. Pt recently had hearing checked and was told it was very good. Pt reports she has lightheaded symptoms at all times without much fluctuation, denies any work symptoms with more active or busy days. Pt denies any specific symptoms change with bed mobility. Pt does feel that her balance is different, but denies any falls or close calls related to this episode.   PAIN:  Are you having pain? No  PRECAUTIONS: None  WEIGHT BEARING RESTRICTIONS: No  FALLS: Has patient fallen in last 6 months? No  PLOF: Limited community AMB without device.   PATIENT GOALS: not be dizzy anymore  OBJECTIVE:  Note: Objective measures were completed at Evaluation unless otherwise noted.  DIAGNOSTIC FINDINGS: Recent hearing testing without any deficits.   ORTHOSTATIC VITAL SIGNS: -129/95mmHg 89bpm sitting  -122/29mmHg 95bpm standing  -129/73mmHg 95bpm standing x1 minute  COORDINATION: Defer to PRN assessmetn   BED MOBILITY:  Independent   TRANSFERS: Independent   CURB:  Modified independent   GAIT: Deferred to  prn assessment   PATIENT SURVEYS:  DHI and ABC Scale to be given at visit 2  BALANCE SCREEN:  Narrow stance, firm surface, eyes closed: 30sec with mild sway  Narrow stance, foam surface, eye closed: 30sec wth moderate sway, elevated falls anxiety   EYES: Oculomotor Screening: 06/03/24  Gaze fixation WNL  horizontal pursuits WNL  Rt/Lt  gaze holding Slight nystagmus with sustained left   vertical pursuits Mildly impaired   up/down gaze holding Not tested  horizontal saccades  Intermittent mild undershooting bilat  vertical saccades  mildly impaired  vergence  deferred  Horizontal VOR (room light) WFL  Vertical VOR (room light) WFL  Vision suppressed: --  neutral gaze, spontaneous nystagmus None; pupils constricted  Horizontal VOR corrective saccade with each direction change  Vertical VOR 1 corrective saccade seen on last movement  Horizontal VOR cancellation Quick nystagmus during movement bilat  Vertical VOR cancellation Quick nystagmus during movement bilat   Cross cover test:  -negative  Head Impulse Test: -Not performed  Dynamic Visual Acuity Test: -static binocular acuity: defer to prn assessment  -dynamic binocular acuity: defer to prn assessment  CANAL TESTING: Trenda Craze:   -Right: negative Sx change, negative nystagmus  -Left: negative Sx change, negative nystagmus Roll Test:   -Right: deferred to prn assessment   -Left: deferred to prn assessment                                                                                                                              TREATMENT DATE 06/03/2024:   -Hor VOR gaze fixation 2x30sec with [place] [employer] [place]  -Vert VOR gaze fixation 2x30sec with [employer] -hor saccade, 2x20, max speed with [employer], [place] -vert saccade, 2x20, max speed with [employer], [place]  PATIENT EDUCATION: Education details: Findings not central, BPPV test negative, suspect some hypofunction, will  review prior notes, complete testing on visit 2  Person educated: JULIENNE VOGLER  Education method: collaborative learning, deliberate practice, positive reinforcement, explicit instruction, establish rules. Education comprehension: Ok  HOME EXERCISE PROGRAM: Access Code: [email or web address] URL: [email or web address] Date: 06/03/2024 Prepared by: Peggye Linear  Exercises - Seated Gaze Stabilization with Head Nod  - 1 x daily - 7 x weekly - 3 sets - 10 reps - Seated Gaze Stabilization with Head Rotation  - 1 x daily - 7 x weekly - 3 sets - 10 reps - Seated Horizontal Saccades  - 1 x daily - 7 x weekly - 3 sets - 10 reps - Seated Vertical Saccades  - 1 x daily - 7 x weekly - 3 sets - 10 reps - Pencil Pushups  - 1 x daily - 7 x weekly - 3 sets - 10 reps  GOALS: Goals reviewed with patient? No  SHORT TERM GOALS: Target date: 07/03/24  >10% improvement in DHI to reveal improved subjective dizziness.  Baseline: Goal status: INITIAL  2.  >10% improvement in ABC scale to indicate improved confidence in balance.  Baseline:  Goal status: INITIAL  3.  Consistent Performance of initital HEP for visual accomodation training.  Baseline:  Goal status: INITIAL  LONG TERM GOALS: Target date: 07/15/24  >25% improvement in DHI to reveal improved subjective dizziness.  Baseline:  Goal status: INITIAL  2.  >17% improvement in ABC scale to indicate improved confidence in balance. Baseline:  Goal status: INITIAL  3.  Pt to report confidence in advanced DC HEP for ongoing accomodation of visual/vestibular system.  Baseline:  Goal status: INITIAL  ASSESSMENT:  CLINICAL IMPRESSION: 78yoF who is referred for CC persistent on-going dizziness without any specific aggravation or relationship to position or exertion. Examination revealing of negative BPPV testing. Mild to moerate disruption of horizontal/vertical VOR and VOR cancellation. Room light oculomotor exam less notable to deficits.  Started pt on visual accomodation training started today to help maximize accomodation. As VOR cancellation is somewhat more limited than VOR itself, this would point more toward cerebellar-related circuitry changes, however unilateral hypofunction cannot be excluded, particularly given it's established presence 5 years back. Patient will benefit from skilled physical therapy intervention to reduce deficits and impairments identified in evaluation, in order to reduce pain, improve quality of life, and maximize activity tolerance for ADL, IADL, and leisure/fitness. Physical therapy will help pt achieve long and short term goals of care.    OBJECTIVE IMPAIRMENTS: Decreased knowledge of condition, decreased use of DME, decreased mobility, difficulty walking, decreased strength, decreased ROM. ACTIVITY LIMITATIONS: Lifting, standing, walking, squatting, transfers, locomotion level PARTICIPATION LIMITATIONS: Cleaning, laundry, interpersonal relationships, driving, yardwork, community activity.  PERSONAL FACTORS: Age, behavior pattern, education, past/current experiences, transportation, profession  are also affecting patient's functional outcome.  REHAB POTENTIAL: Good CLINICAL DECISION MAKING: Medium  EVALUATION COMPLEXITY: Moderate    PLAN:  PT FREQUENCY: 1x/week  PT DURATION: 6 weeks  PLANNED INTERVENTIONS: 97110-Therapeutic exercises, 97530- Therapeutic activity, [id number]- Neuromuscular re-education, 97535- Self Care, [id number]- Manual therapy, [id number]- Gait training, [phone]- Canalith repositioning, Patient/Family education, Balance training, Vestibular training, and Visual/preceptual remediation/compensation  PLAN FOR NEXT SESSION: Complete self report measures x2, repeat performance of HEP exercises.   5:05 PM, 06/03/2024 Peggye JAYSON Linear, PT, DPT Physical Therapist - [facility]  Outpatient Physical Therapy- [facility] [phone]     [place],  PT 06/03/2024, 2:58 PM        "

## 2024-06-10 ENCOUNTER — Ambulatory Visit

## 2024-06-17 ENCOUNTER — Ambulatory Visit

## 2024-06-24 ENCOUNTER — Ambulatory Visit

## 2024-07-01 ENCOUNTER — Ambulatory Visit

## 2024-07-08 ENCOUNTER — Ambulatory Visit

## 2024-07-15 ENCOUNTER — Ambulatory Visit

## 2024-07-22 ENCOUNTER — Ambulatory Visit

## 2024-07-29 ENCOUNTER — Ambulatory Visit

## 2024-08-05 ENCOUNTER — Ambulatory Visit

## 2024-08-12 ENCOUNTER — Ambulatory Visit

## 2024-08-19 ENCOUNTER — Ambulatory Visit
